# Patient Record
Sex: Male | Born: 1965 | Race: Black or African American | Hispanic: No
Health system: Western US, Academic
[De-identification: ages and names within clinical notes are randomized; demographics above are authoritative.]

## PROBLEM LIST (undated history)

## (undated) DIAGNOSIS — F1021 Alcohol dependence, in remission: Secondary | ICD-10-CM

## (undated) DIAGNOSIS — R21 Rash and other nonspecific skin eruption: Secondary | ICD-10-CM

## (undated) DIAGNOSIS — J45909 Unspecified asthma, uncomplicated: Secondary | ICD-10-CM

## (undated) DIAGNOSIS — S86819A Strain of other muscle(s) and tendon(s) at lower leg level, unspecified leg, initial encounter: Secondary | ICD-10-CM

## (undated) HISTORY — PX: KNEE SURGERY: SHX244

## (undated) HISTORY — PX: ACHILLES TENDON SURGERY: SHX542

---

## 2004-09-04 ENCOUNTER — Emergency Department (HOSPITAL_COMMUNITY): Admission: EM | Admit: 2004-09-04 | Discharge: 2004-09-04 | Payer: Self-pay | Admitting: Emergency Medicine

## 2004-10-24 ENCOUNTER — Encounter: Admission: RE | Admit: 2004-10-24 | Discharge: 2004-11-21 | Payer: Self-pay | Admitting: Orthopedic Surgery

## 2005-08-31 ENCOUNTER — Emergency Department (HOSPITAL_COMMUNITY): Admission: EM | Admit: 2005-08-31 | Discharge: 2005-09-01 | Payer: Self-pay | Admitting: Emergency Medicine

## 2005-10-09 ENCOUNTER — Ambulatory Visit: Payer: Self-pay | Admitting: Internal Medicine

## 2005-10-13 ENCOUNTER — Ambulatory Visit (HOSPITAL_COMMUNITY): Admission: RE | Admit: 2005-10-13 | Discharge: 2005-10-13 | Payer: Self-pay | Admitting: Internal Medicine

## 2005-10-20 ENCOUNTER — Ambulatory Visit: Payer: Self-pay | Admitting: Internal Medicine

## 2006-10-02 ENCOUNTER — Emergency Department (HOSPITAL_COMMUNITY): Admission: EM | Admit: 2006-10-02 | Discharge: 2006-10-02 | Payer: Self-pay | Admitting: Family Medicine

## 2006-10-05 ENCOUNTER — Emergency Department (HOSPITAL_COMMUNITY): Admission: EM | Admit: 2006-10-05 | Discharge: 2006-10-05 | Payer: Self-pay | Admitting: Emergency Medicine

## 2007-11-23 ENCOUNTER — Emergency Department (HOSPITAL_COMMUNITY): Admission: EM | Admit: 2007-11-23 | Discharge: 2007-11-23 | Payer: Self-pay | Admitting: Emergency Medicine

## 2009-04-20 ENCOUNTER — Emergency Department (HOSPITAL_COMMUNITY): Admission: EM | Admit: 2009-04-20 | Discharge: 2009-04-20 | Payer: Self-pay | Admitting: Emergency Medicine

## 2010-06-09 LAB — DIFFERENTIAL
Basophils Absolute: 0 10*3/uL (ref 0.0–0.1)
Basophils Relative: 1 % (ref 0–1)
Eosinophils Absolute: 0.1 10*3/uL (ref 0.0–0.7)
Monocytes Relative: 11 % (ref 3–12)
Neutrophils Relative %: 44 % (ref 43–77)

## 2010-06-09 LAB — COMPREHENSIVE METABOLIC PANEL
AST: 250 U/L — ABNORMAL HIGH (ref 0–37)
Albumin: 4.6 g/dL (ref 3.5–5.2)
BUN: 8 mg/dL (ref 6–23)
Creatinine, Ser: 0.86 mg/dL (ref 0.4–1.5)
Total Bilirubin: 0.9 mg/dL (ref 0.3–1.2)

## 2010-06-09 LAB — CBC
HCT: 50 % (ref 39.0–52.0)
Platelets: 196 10*3/uL (ref 150–400)
RBC: 5.22 MIL/uL (ref 4.22–5.81)
WBC: 4.6 10*3/uL (ref 4.0–10.5)

## 2011-01-07 LAB — URINE CULTURE: Colony Count: 6000

## 2011-01-07 LAB — POCT URINALYSIS DIP (DEVICE)
Glucose, UA: NEGATIVE
Hgb urine dipstick: NEGATIVE
Hgb urine dipstick: NEGATIVE
Ketones, ur: NEGATIVE
Operator id: 200941
Protein, ur: 100 — AB
Specific Gravity, Urine: 1.02
Urobilinogen, UA: 0.2
pH: 6

## 2011-01-07 LAB — HEPATIC FUNCTION PANEL
ALT: 27
AST: 34
Albumin: 4.1
Alkaline Phosphatase: 70
Bilirubin, Direct: 0.2
Indirect Bilirubin: 1.1 — ABNORMAL HIGH
Total Bilirubin: 1.3 — ABNORMAL HIGH
Total Protein: 8.1

## 2011-01-07 LAB — DIFFERENTIAL
Basophils Absolute: 0
Basophils Relative: 1
Eosinophils Absolute: 0.3
Eosinophils Relative: 5
Lymphocytes Relative: 30
Lymphs Abs: 2
Monocytes Absolute: 0.5
Monocytes Relative: 8
Neutro Abs: 3.7
Neutrophils Relative %: 57

## 2011-01-07 LAB — POCT I-STAT CREATININE: Creatinine, Ser: 1.3

## 2011-01-07 LAB — CBC
HCT: 46.7
Hemoglobin: 16
MCHC: 34.3
MCV: 89.8
Platelets: 274
RBC: 5.2
RDW: 14.9 — ABNORMAL HIGH
WBC: 6.5

## 2011-01-07 LAB — I-STAT 8, (EC8 V) (CONVERTED LAB)
Acid-Base Excess: 4 — ABNORMAL HIGH
Chloride: 102
HCT: 53 — ABNORMAL HIGH
Potassium: 3.8
Sodium: 138
TCO2: 31
pH, Ven: 7.432 — ABNORMAL HIGH

## 2011-01-07 LAB — PSA: PSA: 0.44

## 2011-01-07 LAB — POCT H PYLORI SCREEN: H. PYLORI SCREEN, POC: NEGATIVE

## 2011-03-20 ENCOUNTER — Emergency Department (HOSPITAL_COMMUNITY)
Admission: EM | Admit: 2011-03-20 | Discharge: 2011-03-21 | Discharge status: Against Medical Advice | Payer: Self-pay | Attending: *Deleted | Admitting: *Deleted

## 2011-03-20 ENCOUNTER — Encounter: Payer: Self-pay | Admitting: *Deleted

## 2011-03-20 DIAGNOSIS — F1010 Alcohol abuse, uncomplicated: Secondary | ICD-10-CM | POA: Insufficient documentation

## 2011-03-20 NOTE — ED Notes (Signed)
The pt was not driving he was on the street and gpd brought him here because he was ??? Intoxicated.  The pt has no complaints

## 2011-03-20 NOTE — ED Notes (Signed)
The pt was brought in by the gpd with a high blood alcohol

## 2011-03-22 ENCOUNTER — Encounter (HOSPITAL_COMMUNITY): Payer: Self-pay | Admitting: *Deleted

## 2011-03-22 ENCOUNTER — Emergency Department (HOSPITAL_COMMUNITY)
Admission: EM | Admit: 2011-03-22 | Discharge: 2011-03-24 | Disposition: A | Discharge status: Other Acute Inpatient Hospital | Payer: Self-pay | Attending: Emergency Medicine | Admitting: Emergency Medicine

## 2011-03-22 DIAGNOSIS — R45851 Suicidal ideations: Secondary | ICD-10-CM | POA: Insufficient documentation

## 2011-03-22 DIAGNOSIS — R51 Headache: Secondary | ICD-10-CM | POA: Insufficient documentation

## 2011-03-22 DIAGNOSIS — F1020 Alcohol dependence, uncomplicated: Secondary | ICD-10-CM | POA: Insufficient documentation

## 2011-03-22 LAB — COMPREHENSIVE METABOLIC PANEL
ALT: 32 U/L (ref 0–53)
AST: 44 U/L — ABNORMAL HIGH (ref 0–37)
Albumin: 4.2 g/dL (ref 3.5–5.2)
Alkaline Phosphatase: 75 U/L (ref 39–117)
BUN: 13 mg/dL (ref 6–23)
Calcium: 8.8 mg/dL (ref 8.4–10.5)
Glucose, Bld: 161 mg/dL — ABNORMAL HIGH (ref 70–99)
Sodium: 138 mEq/L (ref 135–145)
Total Bilirubin: 0.2 mg/dL — ABNORMAL LOW (ref 0.3–1.2)

## 2011-03-22 LAB — ACETAMINOPHEN LEVEL: Acetaminophen (Tylenol), Serum: <15 ug/mL (ref 10–30)

## 2011-03-22 LAB — RAPID URINE DRUG SCREEN, HOSP PERFORMED
Amphetamines: NOT DETECTED
Opiates: NOT DETECTED

## 2011-03-22 LAB — CBC
HCT: 46.6 % (ref 39.0–52.0)
Hemoglobin: 16.6 g/dL (ref 13.0–17.0)
MCHC: 35.6 g/dL (ref 30.0–36.0)
MCV: 88.1 fL (ref 78.0–100.0)
RDW: 13.4 % (ref 11.5–15.5)
WBC: 4.6 10*3/uL (ref 4.0–10.5)

## 2011-03-22 MED ORDER — SODIUM CHLORIDE 0.9 % IV BOLUS (SEPSIS)
1000.0000 mL | Freq: Once | INTRAVENOUS | Status: AC
  Administered 2011-03-22: 1000 mL via INTRAVENOUS

## 2011-03-22 MED ORDER — THIAMINE HCL 100 MG/ML IJ SOLN
Freq: Once | INTRAVENOUS | Status: DC
  Filled 2011-03-22: qty 1000

## 2011-03-22 MED ORDER — THIAMINE HCL 100 MG/ML IJ SOLN
Freq: Once | INTRAMUSCULAR | Status: AC
  Administered 2011-03-22: 22:00:00 via INTRAVENOUS
  Filled 2011-03-22: qty 1000

## 2011-03-22 NOTE — ED Notes (Signed)
Brought in by EMS due to intoxication, pt staes he has been drinking liquor, beer for the last few days straight. Pt is tearful and requesting detox.

## 2011-03-22 NOTE — ED Notes (Signed)
Bed:[facility]<BR> Expected date:03/22/11<BR> Expected time: 5:32 PM<BR> Means of arrival:Ambulance<BR> Comments:<BR> ems/fall

## 2011-03-22 NOTE — ED Notes (Signed)
Pt requested to be transferred from the [place] bed to a private room. Pt moved to room 23 with Charge Nurse approval.

## 2011-03-22 NOTE — ED Notes (Signed)
Pt reports wanting detox for ETOH.

## 2011-03-22 NOTE — ED Provider Notes (Signed)
History     CSN: [id number]  Arrival date & time 03/22/11  1637   First MD Initiated Contact with Patient 03/22/11 2000      No chief complaint on file.   (Consider location/radiation/quality/duration/timing/severity/associated sxs/prior treatment) HPI Comments: Patient is a chronic alcoholic, that several days ago.  He had a DUI and decided it was time to detox again.  His last detox.  Effort was 8 or 9 years ago in [place].  He states he goes to [facility], but not recently.  He does have a history of going into DTs when he comes off alcohol, but no seizure activity  The history is provided by the patient.    History reviewed. No pertinent past medical history.  History reviewed. No pertinent past surgical history.  History reviewed. No pertinent family history.  History  Substance Use Topics  . Smoking status: Never Smoker   . Smokeless tobacco: Not on file  . Alcohol Use: Yes      Review of Systems  Constitutional: Negative.   HENT: Negative for rhinorrhea and sinus pressure.   Respiratory: Negative for cough and shortness of breath.   Gastrointestinal: Negative for nausea, vomiting and abdominal pain.  Genitourinary: Negative for dysuria.  Musculoskeletal: Negative for back pain.  Neurological: Negative for dizziness.    Allergies  Review of patient's allergies indicates no known allergies.  Home Medications  No current outpatient prescriptions on file.  BP 129/84  Pulse 103  Temp(Src) 98.7 F (37.1 C) (Oral)  Resp 20  SpO2 91%  Physical Exam  Constitutional: He is oriented to person, place, and time. He appears well-developed and well-nourished.  HENT:  Head: Normocephalic and atraumatic.  Eyes: Right conjunctiva is injected. Left conjunctiva is injected.  Cardiovascular: Normal rate.   Pulmonary/Chest: Effort normal and breath sounds normal.  Abdominal: Soft.  Neurological: He is alert and oriented to person, place, and time.  Skin:  Skin is warm and dry.  Psychiatric: He has a normal mood and affect. He expresses no homicidal and no suicidal ideation.    ED Course  Procedures (including critical care time)  Labs Reviewed  COMPREHENSIVE METABOLIC PANEL - Abnormal; Notable for the following:    Potassium 3.4 (*)    Glucose, Bld 161 (*)    Total Protein 8.9 (*)    AST 44 (*)    Total Bilirubin 0.2 (*)    All other components within normal limits  ETHANOL - Abnormal; Notable for the following:    Alcohol, Ethyl (B) 434 (*)    All other components within normal limits  CBC  URINE RAPID DRUG SCREEN (HOSP PERFORMED)  ACETAMINOPHEN LEVEL   No results found.   No diagnosis found.  Patient has been assessed by activity.  He meets criteria for admission and will move to our psychiatric area with holding, orders.  We'll monitor for possible DTs and repeat alcohol level at 7:00 this morning  MDM  Patient's alcohol level is 434 will IV hydrate provide IV thiamine and folate monitor.  Alcohol levels reassess patient is levels decreased.  Monitor for DTs        Arman Filter, NP 03/22/11 2021  Arman Filter, NP 03/23/11 [phone]

## 2011-03-23 LAB — ETHANOL: Alcohol, Ethyl (B): 142 mg/dL — ABNORMAL HIGH (ref 0–11)

## 2011-03-23 MED ORDER — LORAZEPAM 1 MG PO TABS: 0.0000 mg | ORAL_TABLET | Freq: Two times a day (BID) | ORAL | Status: DC

## 2011-03-23 MED ORDER — ADULT MULTIVITAMIN W/MINERALS CH: 1.0000 | ORAL_TABLET | Freq: Every day | ORAL | Status: DC

## 2011-03-23 MED ORDER — NICOTINE 21 MG/24HR TD PT24
21.0000 mg | MEDICATED_PATCH | Freq: Every day | TRANSDERMAL | Status: DC
  Filled 2011-03-23: qty 1

## 2011-03-23 MED ORDER — FOLIC ACID 1 MG PO TABS
1.0000 mg | ORAL_TABLET | Freq: Every day | ORAL | Status: DC
  Administered 2011-03-23: 1 mg via ORAL
  Filled 2011-03-23: qty 1

## 2011-03-23 MED ORDER — VITAMIN B-1 100 MG PO TABS
100.0000 mg | ORAL_TABLET | Freq: Every day | ORAL | Status: DC
  Administered 2011-03-23 – 2011-03-24 (×2): 100 mg via ORAL
  Filled 2011-03-23 (×2): qty 1

## 2011-03-23 MED ORDER — LORAZEPAM 1 MG PO TABS
0.0000 mg | ORAL_TABLET | Freq: Four times a day (QID) | ORAL | Status: DC
  Administered 2011-03-23: 1 mg via ORAL
  Administered 2011-03-23: 2 mg via ORAL
  Administered 2011-03-24 (×2): 1 mg via ORAL
  Filled 2011-03-23 (×2): qty 1

## 2011-03-23 MED ORDER — THIAMINE HCL 100 MG/ML IJ SOLN: 100.0000 mg | Freq: Every day | INTRAMUSCULAR | Status: DC

## 2011-03-23 MED ORDER — ONDANSETRON HCL 4 MG PO TABS
4.0000 mg | ORAL_TABLET | Freq: Once | ORAL | Status: AC
  Administered 2011-03-23: 4 mg via ORAL

## 2011-03-23 MED ORDER — FOLIC ACID 1 MG PO TABS
1.0000 mg | ORAL_TABLET | Freq: Every day | ORAL | Status: DC
  Administered 2011-03-24: 1 mg via ORAL
  Filled 2011-03-23: qty 1

## 2011-03-23 MED ORDER — LORAZEPAM 2 MG/ML IJ SOLN: 1.0000 mg | Freq: Four times a day (QID) | INTRAMUSCULAR | Status: DC | PRN

## 2011-03-23 MED ORDER — VITAMIN B-1 100 MG PO TABS: 100.0000 mg | ORAL_TABLET | Freq: Every day | ORAL | Status: DC

## 2011-03-23 MED ORDER — IBUPROFEN 600 MG PO TABS: 600.0000 mg | ORAL_TABLET | Freq: Three times a day (TID) | ORAL | Status: DC | PRN

## 2011-03-23 MED ORDER — LORAZEPAM 1 MG PO TABS: 1.0000 mg | ORAL_TABLET | Freq: Four times a day (QID) | ORAL | Status: DC | PRN

## 2011-03-23 MED ORDER — ADULT MULTIVITAMIN W/MINERALS CH
1.0000 | ORAL_TABLET | Freq: Every day | ORAL | Status: DC
  Administered 2011-03-23 – 2011-03-24 (×2): 1 via ORAL
  Filled 2011-03-23 (×2): qty 1

## 2011-03-23 MED ORDER — ONDANSETRON HCL 4 MG PO TABS
4.0000 mg | ORAL_TABLET | Freq: Three times a day (TID) | ORAL | Status: DC | PRN
  Administered 2011-03-23: 4 mg via ORAL
  Filled 2011-03-23 (×2): qty 1

## 2011-03-23 MED ORDER — LORAZEPAM 1 MG PO TABS
1.0000 mg | ORAL_TABLET | Freq: Four times a day (QID) | ORAL | Status: DC | PRN
  Administered 2011-03-23: 1 mg via ORAL
  Filled 2011-03-23 (×2): qty 1
  Filled 2011-03-23: qty 2

## 2011-03-23 NOTE — ED Notes (Signed)
Information faxed to [facility] for review, waiting disposition.

## 2011-03-23 NOTE — ED Notes (Signed)
Up to the desk on the phone 

## 2011-03-23 NOTE — ED Notes (Signed)
Pt reports nausea has returned.  Dr linker updated-may repeat zofran now

## 2011-03-23 NOTE — ED Notes (Signed)
Pt changed into blue paper scrubs and security called to wand patient and his personal belongings.

## 2011-03-23 NOTE — ED Notes (Signed)
Lab in to draw

## 2011-03-23 NOTE — ED Notes (Signed)
Pt. Accepted in [facility] for treatment but bed will not be available until Monday 03/24/2011.

## 2011-03-23 NOTE — BH Assessment (Signed)
Assessment Note   Francisco Bates is an 45 y.o. male who is seeking help for detox and treatment for his alcohol abuse.  He states that he has been clean for close to a year and attends 12 step meetings on a regular basis.  He further shared that his recent relapse was due to reconnecting with a former friend from his home town that was in the area for [employer] and they start to drink together. Within that time frame he obtained a DWI and continue to drink but called 911 for help because he "could no longer keep drinking" like he was.  He was brought in by [facility] and based on their report he was walking down the street but the patient didn't remember it, he believes he experienced a blackout due to his drinking.  He has been in an inpatient treatment facility for 14 days and it was close to 5 years ago.  The facility was in [place] and he was unable to remember the name of the treatment facility.   He was coherent during the assessment and was also tearful about his relapse.  He keep making statements that he "knew better and I am to old to be acting like this."  He reports of having no medical conditions.  He reports of having no SI/HI or any visual or auditory hallucinations.   Axis I: Alcohol Dependence (303.90) Axis II: Diagnosis Deferred Axis III: Past Medical History: History reviewed. No pertinent past medical history.  History reviewed. No pertinent past surgical history. Axis IV: Relationship with is girlfriend is experience tension due to his alcohol use. Axis V: 30   Family History: History reviewed. No pertinent family history.  Social History:  reports that he has never smoked. He does not have any smokeless tobacco history on file. He reports that he drinks alcohol. His drug history not on file.  Additional Social History:  Alcohol / Drug Use Pain Medications: None reported Prescriptions: None reported Over the Counter: None reported History of alcohol / drug use?:  Yes Substance #1 Name of Substance 1: Alcohol  1 - Age of First Use: Unknown 1 - Amount (size/oz): He reports he is unsure of the amount he drinks. He drinks untill he get drunk. 1 - Frequency: He reports of having close to a year of being sober and this last past week he relapse and drunk for about three days straight. 1 - Duration: "For years on and off." 1 - Last Use / Amount: 03/22/2011 Allergies: No Known Allergies  Home Medications:  Medications Prior to Admission  Medication Dose Route Frequency Provider Last Rate Last Dose  . ibuprofen (ADVIL,MOTRIN) tablet 600 mg  600 mg Oral Q8H PRN Arman Filter, NP      . nicotine (NICODERM CQ - dosed in mg/24 hours) patch 21 mg  21 mg Transdermal Daily Arman Filter, NP      . ondansetron [facility]) tablet 4 mg  4 mg Oral Q8H PRN Arman Filter, NP      . sodium chloride 0.9 % 1,000 mL with thiamine 100 mg, folic acid 1 mg infusion   Intravenous Once [place], PA 150 mL/hr at 03/22/11 2144    . sodium chloride 0.9 % bolus 1,000 mL  1,000 mL Intravenous Once Arman Filter, NP   1,000 mL at 03/22/11 2143  . DISCONTD: sodium chloride 0.9 % 1,000 mL with thiamine 100 mg, folic acid 1 mg, multivitamins adult 10 mL infusion  Intravenous Once Arman Filter, NP       No current outpatient prescriptions on file as of 03/22/2011.    OB/GYN Status:  No LMP for male patient.  General Assessment Data Location of Assessment: [facility] ED ACT Assessment: Yes Living Arrangements: Spouse/significant other (Lives with girlfriend) Can pt return to current living arrangement?: Yes Admission Status: Voluntary Is patient capable of signing voluntary admission?: Yes Transfer from: Acute Hospital Referral Source: Self/Family/Friend ([employer], he reports of calling 911 for help & brought in by [facility])  Education Status Is patient currently in school?: No Highest grade of school patient has completed: High School  Risk to self Suicidal Ideation: No Suicidal Intent:  No Is patient at risk for suicide?: No Suicidal Plan?: No Access to Means: No What has been your use of drugs/alcohol within the last 12 months?: Alcohol, last drank 03/22/2011 Previous Attempts/Gestures: No How many times?: 0  Other Self Harm Risks: 0 Triggers for Past Attempts: None known Intentional Self Injurious Behavior: None Family Suicide History: No Recent stressful life event(s): Conflict (Comment);Other (Comment) (Old friend called to "hang out" & he started drinking again) Persecutory voices/beliefs?: No Depression: Yes Depression Symptoms: Tearfulness;Feeling worthless/self pity;Isolating;Guilt Substance abuse history and/or treatment for substance abuse?: Yes Suicide prevention information given to non-admitted patients: Not applicable  Risk to Others Homicidal Ideation: No Thoughts of Harm to Others: No Current Homicidal Intent: No Current Homicidal Plan: No Access to Homicidal Means: No Identified Victim: None reported History of harm to others?: No Assessment of Violence: None Noted Violent Behavior Description: None noted Does patient have access to weapons?: No Criminal Charges Pending?: Yes Describe Pending Criminal Charges: DWI Does patient have a court date: Yes  Psychosis Hallucinations: None noted Delusions: None noted  Mental Status Report Appear/Hygiene: Other (Comment) (Neat) Eye Contact: Good Motor Activity: Freedom of movement;Unsteady Speech: Logical/coherent Level of Consciousness: Alert;Crying Mood: Depressed;Despair;Guilty;Helpless;Sad;Worthless, low self-esteem Affect: Appropriate to circumstance;Depressed Anxiety Level: Minimal Thought Processes: Coherent;Relevant Judgement: Impaired Orientation: Person;Place;Time;Situation Obsessive Compulsive Thoughts/Behaviors: Minimal  Cognitive Functioning Concentration: Normal Memory: Recent Intact;Remote Intact IQ: Average Insight: Poor Impulse Control: Poor Appetite: Good Weight  Loss: 0  (No change reported) Weight Gain: 0  (No change reported) Sleep: No Change Total Hours of Sleep: 8  Vegetative Symptoms: None  Prior Inpatient Therapy Prior Inpatient Therapy: Yes Prior Therapy Dates: 2007 Prior Therapy Facilty/Provider(s): Name is unknown but was in [place] Reason for Treatment: Alcohol Abuse  Prior Outpatient Therapy Prior Outpatient Therapy: No          Abuse/Neglect Assessment (Assessment to be complete while patient is alone) Physical Abuse: Denies Verbal Abuse: Denies Sexual Abuse: Denies Exploitation of patient/patient's resources: Denies Self-Neglect: Denies Values / Beliefs Cultural Requests During Hospitalization: None Spiritual Requests During Hospitalization: None Consults Spiritual Care Consult Needed: No Social Work Consult Needed: No      Additional Information 1:1 In Past 12 Months?: No CIRT Risk: No Elopement Risk: No Does patient have medical clearance?: No     Disposition:  Disposition Disposition of Patient: Inpatient treatment program;Outpatient treatment;Referred to [facility]) Type of inpatient treatment program: Adult Type of outpatient treatment: Adult Patient referred to: Other (Comment) ([facility] and [facility])  On Site Evaluation by:   Reviewed with Physician:     Morley Kos MS, LCAS, LPCA, NCC 03/23/2011 2:33 AM

## 2011-03-23 NOTE — ED Notes (Signed)
CSW contacted [facility] to follow up on acceptance. [facility] staff informed Clinical research associate pt does not meet detox criteria and for ACT to pursue substance abuse treatment referral on Monday December 31. CSW will notify pt's RN and MD.

## 2011-03-23 NOTE — ED Provider Notes (Signed)
Patient seen and evaluated in the psych ED this morning. He has been accepted at [facility] for alcohol detox and there are still attempts being made for her behavioral health visit her in if possible today. He complains of a mild headache. He has no other complaints and is resting comfortably at this time.  Ethelda Chick, MD 03/23/11 0800

## 2011-03-23 NOTE — ED Notes (Signed)
Nausea/vomiting improved, sipping ginger ale, skin dry, ativan given

## 2011-03-23 NOTE — ED Notes (Signed)
Pt has one personal belongings bag at the Nurses' Station

## 2011-03-23 NOTE — ED Notes (Signed)
Gave report to [place], RN in [employer], escorted pt to room 35 in Psych Ed and locked up one personal belongings bag in the activity room in Psych ED.

## 2011-03-23 NOTE — ED Notes (Signed)
Waiting on infusion to complete before transferring patient to the Psych ED

## 2011-03-24 ENCOUNTER — Inpatient Hospital Stay (HOSPITAL_COMMUNITY)
Admission: AD | Admit: 2011-03-24 | Discharge: 2011-03-28 | DRG: 897 | Disposition: A | Discharge status: Home / Self Care | Payer: Self-pay | Source: Ambulatory Visit | Attending: Psychiatry | Admitting: Psychiatry

## 2011-03-24 ENCOUNTER — Encounter (HOSPITAL_COMMUNITY): Payer: Self-pay | Admitting: *Deleted

## 2011-03-24 DIAGNOSIS — F411 Generalized anxiety disorder: Secondary | ICD-10-CM

## 2011-03-24 DIAGNOSIS — Z6379 Other stressful life events affecting family and household: Secondary | ICD-10-CM

## 2011-03-24 DIAGNOSIS — F1090 Alcohol use, unspecified, uncomplicated: Secondary | ICD-10-CM | POA: Diagnosis present

## 2011-03-24 DIAGNOSIS — F1010 Alcohol abuse, uncomplicated: Principal | ICD-10-CM

## 2011-03-24 DIAGNOSIS — F3289 Other specified depressive episodes: Secondary | ICD-10-CM

## 2011-03-24 DIAGNOSIS — F1020 Alcohol dependence, uncomplicated: Secondary | ICD-10-CM | POA: Diagnosis present

## 2011-03-24 DIAGNOSIS — F329 Major depressive disorder, single episode, unspecified: Secondary | ICD-10-CM

## 2011-03-24 DIAGNOSIS — Z79899 Other long term (current) drug therapy: Secondary | ICD-10-CM

## 2011-03-24 DIAGNOSIS — I10 Essential (primary) hypertension: Secondary | ICD-10-CM

## 2011-03-24 LAB — ETHANOL: Alcohol, Ethyl (B): <11 mg/dL (ref 0–11)

## 2011-03-24 MED ORDER — LOPERAMIDE HCL 2 MG PO CAPS: 2.0000 mg | ORAL_CAPSULE | ORAL | Status: AC | PRN

## 2011-03-24 MED ORDER — MAGNESIUM HYDROXIDE 400 MG/5ML PO SUSP: 30.0000 mL | Freq: Every day | ORAL | Status: DC | PRN

## 2011-03-24 MED ORDER — CLONIDINE HCL 0.1 MG PO TABS
0.1000 mg | ORAL_TABLET | Freq: Three times a day (TID) | ORAL | Status: DC
  Administered 2011-03-24 (×2): 0.1 mg via ORAL
  Filled 2011-03-24 (×2): qty 1

## 2011-03-24 MED ORDER — CHLORDIAZEPOXIDE HCL 25 MG PO CAPS
25.0000 mg | ORAL_CAPSULE | Freq: Every day | ORAL | Status: AC
  Administered 2011-03-28: 25 mg via ORAL
  Filled 2011-03-24: qty 1

## 2011-03-24 MED ORDER — VITAMIN B-1 100 MG PO TABS
100.0000 mg | ORAL_TABLET | Freq: Every day | ORAL | Status: DC
  Administered 2011-03-25 – 2011-03-28 (×4): 100 mg via ORAL
  Filled 2011-03-24 (×7): qty 1

## 2011-03-24 MED ORDER — HYDROXYZINE HCL 25 MG PO TABS
25.0000 mg | ORAL_TABLET | Freq: Four times a day (QID) | ORAL | Status: AC | PRN
  Administered 2011-03-25 – 2011-03-26 (×3): 25 mg via ORAL
  Filled 2011-03-24: qty 1

## 2011-03-24 MED ORDER — ALUM & MAG HYDROXIDE-SIMETH 200-200-20 MG/5ML PO SUSP: 30.0000 mL | ORAL | Status: DC | PRN

## 2011-03-24 MED ORDER — ACETAMINOPHEN 325 MG PO TABS: 650.0000 mg | ORAL_TABLET | Freq: Four times a day (QID) | ORAL | Status: DC | PRN

## 2011-03-24 MED ORDER — CHLORDIAZEPOXIDE HCL 25 MG PO CAPS
25.0000 mg | ORAL_CAPSULE | Freq: Three times a day (TID) | ORAL | Status: AC
  Administered 2011-03-26 (×3): 25 mg via ORAL
  Filled 2011-03-24 (×3): qty 1

## 2011-03-24 MED ORDER — ADULT MULTIVITAMIN W/MINERALS CH
1.0000 | ORAL_TABLET | Freq: Every day | ORAL | Status: DC
  Administered 2011-03-24 – 2011-03-25 (×2): via ORAL
  Administered 2011-03-26 – 2011-03-28 (×3): 1 via ORAL
  Filled 2011-03-24 (×6): qty 1

## 2011-03-24 MED ORDER — CLONIDINE HCL 0.1 MG PO TABS
0.1000 mg | ORAL_TABLET | Freq: Three times a day (TID) | ORAL | Status: DC
  Administered 2011-03-25 – 2011-03-28 (×12): 0.1 mg via ORAL
  Filled 2011-03-24 (×18): qty 1

## 2011-03-24 MED ORDER — CHLORDIAZEPOXIDE HCL 25 MG PO CAPS
25.0000 mg | ORAL_CAPSULE | ORAL | Status: AC
  Administered 2011-03-27 (×2): 25 mg via ORAL
  Filled 2011-03-24 (×2): qty 1

## 2011-03-24 MED ORDER — CHLORDIAZEPOXIDE HCL 25 MG PO CAPS: 25.0000 mg | ORAL_CAPSULE | Freq: Four times a day (QID) | ORAL | Status: AC | PRN

## 2011-03-24 MED ORDER — ONDANSETRON 4 MG PO TBDP: 4.0000 mg | ORAL_TABLET | Freq: Four times a day (QID) | ORAL | Status: AC | PRN

## 2011-03-24 MED ORDER — CHLORDIAZEPOXIDE HCL 25 MG PO CAPS
25.0000 mg | ORAL_CAPSULE | Freq: Four times a day (QID) | ORAL | Status: AC
  Administered 2011-03-24 – 2011-03-25 (×4): 25 mg via ORAL
  Filled 2011-03-24 (×3): qty 1

## 2011-03-24 MED ORDER — FOLIC ACID 1 MG PO TABS
1.0000 mg | ORAL_TABLET | Freq: Every day | ORAL | Status: DC
  Administered 2011-03-24 – 2011-03-28 (×5): 1 mg via ORAL
  Filled 2011-03-24 (×8): qty 1

## 2011-03-24 MED ORDER — INFLUENZA VIRUS VACC SPLIT PF IM SUSP
0.5000 mL | INTRAMUSCULAR | Status: AC
  Administered 2011-03-25: 0.5 mL via INTRAMUSCULAR

## 2011-03-24 NOTE — BH Assessment (Signed)
Per shift report pt does not meet detox criteria. ACT to refer patient into a residential setting for treatment.  Writer called to inquire about bed availability for the residential program. Spoke to [place] and she verified beds. Writer initiated the process by faxing patients information to [facility]. Writer then completed a phone referral with Shayla. Once the referral process was completed the psyhiatrist (Dr. Rogers Blocker) interceded stating, "patient has HBP and needs detox". Dr. Rogers Blocker explained that patient needs to be detoxed.   Writer reversed the entire process that was just completed to get patient into the residential program. Renato Battles back to cancel the referral.   Patient's disposition is pending a detox program, per Dr. Rogers Blocker.

## 2011-03-24 NOTE — Progress Notes (Signed)
In dayroom, watching TV on approach. Appears flat and depressed. Calm and cooperative with assessment. A/Ox4. No acute distress noted. States he is adjusting well to milieu. When asked how he was feeling states," I am anxious and angry with myself for doing this.". Support and encouragement provided. Discussed events leading up to admission. Remorseful for hanging out with someone he knew was a bad influence. Expresses desire to remain sober. CIWA is a 1 r/t mild anxiety. Otherwise has no questions or concerns. Denies SI/HI/AVH and contracts for safety. Denies pain or discomfort. POC and medications for the shift reviewed and understanding verbalized. Safety has been maintained with Q16minute observation. Will cont current POC.

## 2011-03-24 NOTE — ED Provider Notes (Addendum)
He was seen at 09:24  hours. He denies any needs at this time. He is alert and oriented x3. He is calm. He continues to feel he needs inpatient treatment. Flint Melter, MD 03/24/11 1610  Flint Melter, MD 03/24/11 [phone]

## 2011-03-24 NOTE — BH Assessment (Signed)
Patient assessed and awaiting a evaluation by the psychiatrist Dr. Rogers Blocker to determine disposition.

## 2011-03-24 NOTE — ED Provider Notes (Signed)
History/physical exam/procedure(s) were performed by non-physician practitioner and as supervising physician I was immediately available for consultation/collaboration. I have reviewed all notes and am in agreement with care and plan.   Mahika Vanvoorhis S Spencer Peterkin, MD 03/24/11 1546 

## 2011-03-24 NOTE — Progress Notes (Signed)
Patient ID: Francisco Bates, male   DOB: 02/14/1966, 45 y.o.   MRN: [id number] Pt admitted voluntarily for ETOH detox, pt reports being clean for about a year and had a recent relapse with a friend. Pt denies any significant medical history, states that he is on no home medications and does report feeling anxious on admission. Pt denies any suicidality, homicidality or auditory hallucinations. Pt currently lives with fiancee and plans to discharge back there, pt denies any pain on admission and denies the use of any other substances

## 2011-03-24 NOTE — BH Assessment (Signed)
Patient accepted to [facility] by Dr. Eulogio Ditch. Patient assigned to room 503-2. Writer informed the EDP (Dr. Effie Shy) of patients disposition. Writer informed patients nurse and support paperwork was completed. Pt will be transferred to [facility] via security.

## 2011-03-24 NOTE — Consult Note (Signed)
Patient Identification:  [name] Date of Evaluation:  03/24/2011   History of Present Illness:  Patient is seen and assessment reviewed. Francisco Bates is an 45 y.o. male who is seeking help for detox and treatment for his alcohol abuse. He states that he has been clean for close to a year and attends 12 step meetings on a regular basis. He further shared that his recent relapse was due to reconnecting with a former friend from his home town that was in the area for Francisco Bates and they start to drink together. Within that time frame he obtained a DWI and continue to drink but called 911 for help because he "could no longer keep drinking" like he was. He was brought in by [facility] and based on their report he was walking down the street but the patient didn't remember it, he believes he experienced a blackout due to his drinking.  Patient is calm cooperative during the interview not hallucinating or delusional not suicidal or homicidal is motivated for detox.   Past Medical History:    History reviewed. No pertinent past medical history.    History reviewed. No pertinent past surgical history.  Filed Vitals:   03/24/11 1003  BP: 152/104  Pulse: 89  Temp: 98.5 F (36.9 C)  Resp: 18    Lab Results:   BMET    Component Value Date/Time   NA 138 03/22/2011 1645   K 3.4* 03/22/2011 1645   CL 101 03/22/2011 1645   CO2 21 03/22/2011 1645   GLUCOSE 161* 03/22/2011 1645   BUN 13 03/22/2011 1645   CREATININE 0.93 03/22/2011 1645   CALCIUM 8.8 03/22/2011 1645   GFRNONAA >90 03/22/2011 1645   GFRAA >90 03/22/2011 1645    Allergies: No Known Allergies  Current Medications:  Prior to Admission medications   Not on File    Social History:    reports that he has never smoked. He does not have any smokeless tobacco history on file. He reports that he drinks alcohol. His drug history not on file.   Family History:    History reviewed. No pertinent family  history.   DIAGNOSIS:   AXIS I  chronic alcohol dependence   AXIS II  Deffered  AXIS III See medical notes.  AXIS IV  relationship issues with girlfriend   AXIS V 45     Recommendations:  Patient will be continued on Ativan detox protocol and will be admitted for further detox and stabilization.    Eulogio Ditch, MD

## 2011-03-25 DIAGNOSIS — F1090 Alcohol use, unspecified, uncomplicated: Secondary | ICD-10-CM | POA: Diagnosis present

## 2011-03-25 DIAGNOSIS — F1020 Alcohol dependence, uncomplicated: Secondary | ICD-10-CM | POA: Diagnosis present

## 2011-03-25 DIAGNOSIS — F1010 Alcohol abuse, uncomplicated: Principal | ICD-10-CM

## 2011-03-25 HISTORY — DX: Alcohol use, unspecified, uncomplicated: F10.90

## 2011-03-25 NOTE — Progress Notes (Signed)
Pt was moved from the 500 hall to the 300 hall d/t his ETOH use and anticipation of withdrawal symptoms.  Pt c/o anxiety and mild tremors, but no other symptoms at this time.  Pt had been given Vistaril at 1854.  Pt is at the nurse's station requesting info of the group he attended this morning as he felt it was very beneficial and wanted to share with his girlfriend who is visiting at this time.  Pt is pleasant/cooperative.  He denies SI/HI.  He voices no other complaints at this time.  Safety maintained with q15 minute checks.

## 2011-03-25 NOTE — H&P (Signed)
Psychiatric Admission Assessment Adult  Patient Identification:  Francisco Bates Date of Evaluation:  03/25/2011 46 yo DAAM who is engaged History of Present Illness:: Short relapse on alcohol but did get a DWI.Got up with an old friend who was in town for the holidays. Originally came to ED on Thursday but left AMA and then returned. Reports going to AA meetings but now realizes that he doesn't actually work the program-doesn't share and has never gotten a sponsor. ETOH was 434.Denies DT's withdrawal seizures or blackouts in past 5-6  years.    Past Psychiatric History: one prior detox a few years ago in [place] -had DWI then too.   Substance Abuse History: Alcohol says he just relapsed on Thursday -had been sober 5-6 years.  Social History:    reports that he has never smoked. He does not have any smokeless tobacco history on file. He reports that he drinks alcohol. His drug history not on file. HS class of '87 Divorced once with a 70 yo daughter. 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 Lives with fiancee.  Family Psych History:Uncles both sides mother and maternal aunts all have alcohol issues.   Past Medical History:    No past medical history on file.    No past surgical history on file.  Allergies: No Known Allergies  Current Medications:  Prior to Admission medications   Not on File    Mental Status Examination/Evaluation: Objective:  Appearance: Well Groomed  Psychomotor Activity:  Normal  Eye Contact::  Good  Speech:  Clear and Coherent  Volume:  Normal  Mood:  Back to normal  Affect:  Full Range  Thought Process:  Clear rational goal oriented   Orientation:  Full  Thought Content:  No AVH psychosis   Suicidal Thoughts:  No  Homicidal Thoughts:  No  Judgement:  Intact  Insight:  Good    DIAGNOSIS:    AXIS I Alcohol Abuse  AXIS II Deferred  AXIS III See medical history.  AXIS IV poor choice   AXIS V 41-50  serious symptoms     Treatment Plan Summary: Admit for medically supervised alcohol detox. Reports short relapse 2 days but did get DWI. Use Librium as indicated.  Agree with H&P from ED.  Mickie Deery Chontel Warning PA-C

## 2011-03-25 NOTE — Progress Notes (Signed)
Suicide Risk Assessment  Admission Assessment     Demographic factors:  Assessment Details Time of Assessment: Admission Information Obtained From: Patient Current Mental Status:    Loss Factors:  Loss Factors: Legal issues Historical Factors:    Risk Reduction Factors:  Risk Reduction Factors: Sense of responsibility to family;Living with another person, especially a relative;Positive social support;Positive therapeutic relationship;Positive coping skills or problem solving skills  CLINICAL FACTORS:   Severe Anxiety and/or Agitation Depression:   Comorbid alcohol abuse/dependence Hopelessness Alcohol/Substance Abuse/Dependencies Previous Psychiatric Diagnoses and Treatments  COGNITIVE FEATURES THAT CONTRIBUTE TO RISK:  No Cognitive risk factors noted.   SUICIDE RISK:   Minimal: No identifiable suicidal ideation.  Patients presenting with no risk factors but with morbid ruminations; may be classified as minimal risk based on the severity of the depressive symptoms  Reason for admission: Desires detox from brief relapse with alcohol  Mental Status Examination and Plan: Patient denies suicidal or homicidal ideation, hallucinations, illusions, or delusions. Patient engages with good eye contact, is able to focus adequately in a one to one setting, and has clear goal directed thoughts. Patient speaks with a natural conversational volume, rate, and tone. Anxiety was reported at 3 on a scale of 1 the least and 10 the most. Depression was reported at 1 on the same scale. Patient is oriented times 4, recent and remote memory intact. Judgement: impaired by his addiction Insight: impaired by his addiction. Plan:  We will admit the patient for crisis stabilization and treatment. I talked to pt about starting Librium protocol for detox. I explained the risks and benefits of medication in detail.  We will continue on q. 15 checks the unit protocol. At this time there is no clinical  indication for one-to-one observation as patient contract for safety and presents little risk to harm themself and others.  We will increase collateral information. I encourage patient to participate in group milieu therapy. Please see history and physical note for more detailed information ELOS: 3 to 5 days.   Francisco Bates 03/25/2011, 11:34 AM

## 2011-03-25 NOTE — Progress Notes (Signed)
Out of room and interacting with peers today.  Attending groups.  Affect brighter.  Denies suicidal ideation.  Tolerating medications well.  No overt withdrawal symptoms noted.  Appetite good.  Pleasant and cooperative with staff.

## 2011-03-25 NOTE — Progress Notes (Addendum)
[facility] Group Notes: (Counselor/Nursing/MHT/Case Management/Adjunct)   Type of Therapy:  Group Therapy  Participation Level:  Active  Participation Quality:  Attentive, Appropriate, Sharing  Affect:  Blunted  Cognitive:  Appropriate  Insight: good  Engagement in Group: Good    Engagement in Therapy:  Good  Modes of Intervention:  Support and Exploration  Summary of Progress/Problems:  Benjimen processed changes that he is willing to make after discharge and identified          That he needs to be more engaged in AA by finding a sponsor and actually working the steps.  He shared that last time he attended the meetings but that was all, and although he thought he got what he needed at the time, he now realizes that having a connection with others in the group will be very helpful on his recovery journey. He explored ways to set this change into action and ways to keep himself motivated. Connar identified that maintenance is the stage he has trouble with, and that he doesn't have any friends outside of drinking buddies, so when they call him up his desire to be accepted kicks in and he goes to hang out with them, even though he knows their values differ.                Billie Lade 03/25/2011  11:45 AM

## 2011-03-25 NOTE — Discharge Planning (Signed)
Met with patient briefly in day room.  He states there are no pressing case management needs today and will wait until tomorrow to talk with his regular Case Manager.   Ambrose Mantle, LCSW 03/25/2011, 3:07 PM

## 2011-03-26 DIAGNOSIS — F1020 Alcohol dependence, uncomplicated: Secondary | ICD-10-CM

## 2011-03-26 NOTE — Discharge Planning (Signed)
Francisco Bates attended Am group, participation.  Was sober for 2 years until recent relapse on alcohol, resulting in DUI last week. Last detox was 2 years ago at [facility].  States he was going to AA several times a week during sobriety, but was not really active, and did not seek out a sponsor. Knows now that he needs to fully engage in process.  Has a landscaping business.  Taking classes this semester.  Plans to return home at d/c.  Follow up [facility] if on meds.

## 2011-03-26 NOTE — Progress Notes (Signed)
[facility] Group Notes:  (Counselor/Nursing/MHT/Case Management/Adjunct)  03/26/2011 6:01 PM   Type of Therapy:  Processing Group at 11:00 am  Participation Level:  Active   Participation Quality:  Appropriate  Affect:  appropriate  Cognitive:  Appropriate   Insight:  Good   Engagement in Group:  good   Engagement in Therapy:   good   Modes of Intervention:   exploration socialization and education   Summary of Progress/Problems: Francisco Bates was attentive and sharing during discussion about patterns of communication we learned in early childhood from our family. He shared extreme differences about what really happened in the home and what was portrayed outside the home. Patient was open about the fact that he does not share his own thinking with those closest to him he needs a safe place, like a sponsor, someone he can share that his thoughts with.  [facility] Group Notes:  (Counselor/Nursing/MHT/Case Management/Adjunct)  03/26/2011 6:01 PM   Type of Therapy:  Counseling Group at 1:15 pm  Participation Level:  Active   Participation Quality:   appropriate   Affect:   appropriate  Cognitive:   appropriate   Insight:   good   Engagement in Group:  Good   Engagement in Therapy:   good   Modes of Intervention:   exploration and socialization and support   Summary of Progress/Problems:  Francisco Bates shared how his drinking picked up in one day after 2-1/2 years of abstinence; at the hospital his blood alcohol was over 3 he received a DUI and he lost touch with reality. Patient sees the need to increase his involvement in the 12 step recovery group such as AA. Patient was open to the disease concept of addiction although he is feeling morally low.  [name], LCSWA 03/26/2011 6:01 PM

## 2011-03-26 NOTE — Progress Notes (Signed)
Pt attends groups and interacts well with peers and staff. Pt was offered support and encouragement. Pt denies SI/HI. Pt is receptive to treatment and safety is maintained on unit.

## 2011-03-27 NOTE — Progress Notes (Signed)
Pt attends groups and interacts well with peers and staff. Pt was offered support and encouragement. Pt denies SI/HI. Pt may be D/C tomorrow. Pt is concerned about leaving because ride will not be able to get him until after 6pm. Pt was reassured that if he was able to leave it would be ok if he left after 6pm. Pt is receptive to treatment and safety is maintained on unit.

## 2011-03-27 NOTE — Treatment Plan (Signed)
Interdisciplinary Treatment Plan Update (Adult)  Date: 03/27/2011  Time Reviewed: 10:32 AM   Progress in Treatment: Attending groups: Yes Participating in groups: Yes Taking medication as prescribed: Yes Tolerating medication: Yes   Family/Significant othe contact made: None identifed   Patient understands diagnosis:  Yes  As evidenced by asking for help with alcoholism Discussing patient identified problems/goals with staff:  Yes See below Medical problems stabilized or resolved:  Yes Denies suicidal/homicidal ideation: Yes Issues/concerns per patient self-inventory:  None noted Other:  New problem(s) identified: N/A  Reason for Continuation of Hospitalization: Medication stabilization Withdrawal symptoms  Interventions implemented related to continuation of hospitalization: Librium detox protocol  Group participation  Additional comments:  Estimated length of stay: d/c tomorrow  Discharge Plan: Return home  Get fully involved in AA, including getting a sponsor  Follow up mental health New goal(s): N/A  Review of initial/current patient goals per problem list:   1.  Goal(s):Safely detox from alcohol  Met:  No  Target date:1/4  As evidenced [id number] in CIWA score from current to 0  2.  Goal (s):Set up with mental health services in [place]  Met:  No  Target date:1/4  As evidenced [id number] at [facility]   3.  Goal(s):Set up with health services in [facility] for HTN  Met:  No  Target date:1/4  As evidenced by:PA to set up screening appointment at [facility]  4.  Goal(s):  Met:  No  Target date:  As evidenced by:  Attendees: Patient:  Francisco Bates 03/27/2011 10:32 AM  Family:     Physician:  Lupe Carney 03/27/2011 10:32 AM   Nursing:  Omelia Blackwater  03/27/2011 10:32 AM   Case Manager:  Richelle Ito, LCSW 03/27/2011 10:32 AM   Counselor:  [name], LCSWA 03/27/2011 10:32 AM   Other: Shelda Jakes  03/27/2011 10:32 AM  Other:     Other:      Other:      Scribe for Treatment Team:   [name], 03/27/2011 10:32 AM

## 2011-03-27 NOTE — Progress Notes (Signed)
Pt in the dayroom watching TV.  Attended and participated in evening group.  Interacting well with his peers.  Denies any withdrawal symptoms at this time.  Voices no complaints/concerns.  Safety maintained with q15 minute checks.

## 2011-03-27 NOTE — Progress Notes (Signed)
Suicide Risk Assessment  Discharge Assessment     Demographic factors:  See chart.  Current Mental Status: Patient seen and evaluated. Chart reviewed. Patient stated that his mood was "good". His affect was mood congruent and euthymic. He denied any current thoughts of self injurious behavior, suicidal ideation or homicidal ideation. He denied any significant depressive signs or symptoms at this time. There were no auditory or visual hallucinations, paranoia, delusional thought processes, or mania noted.  Thought process was linear and goal directed.  No psychomotor agitation or retardation was noted. His speech was normal rate, tone and volume. Eye contact was good. Judgment and insight are fair.  Patient has been up and engaged on the unit.  No safety concerns reported from team.  Taper completed Friday.  Loss Factors:  Legal issues  Historical Factors:  Patient Active Problem List  Diagnoses  . Alcohol dependence     Risk Reduction Factors:  Risk Reduction Factors: Sense of responsibility to family;Living with another person, especially a relative;Positive social support;Positive therapeutic relationship;Positive coping skills or problem solving skills  CLINICAL FACTORS (noted upon admission): Severe Anxiety and/or Agitation Depression:   Comorbid alcohol abuse/dependence Hopelessness Alcohol/Substance Abuse/Dependencies Previous Psychiatric Diagnoses and Treatments  COGNITIVE FEATURES THAT CONTRIBUTE TO RISK: none.  SUICIDE RISK: Pt viewed as a chronic moderate risk of harm to self in light of her past hx and risk factors.  No acute safety concerns since on the unit.  Pt contracting for safety and looking for discharge on 03/28/11.  Filed Vitals:   03/27/11 1500  BP: 127/78  Pulse: 73  Temp: 97.1 F (36.2 C)  Resp: 18   Plan: Pt seen and evaluated.  Chart reviewed.  Pt stable for and requesting discharge. Pt contracting for safety and does not currently meet [place] involuntary  commitment criteria for continued hospitalization.  Mental health treatment, medication management and continued sobriety will mitigate against the increased risk of harm to self and/or others.  Discussed the importance of recovery further with pt, as well as, tools to move forward in a healthy & safe manner.  Pt agreeable with the plan.  Discussed with the team.  Please see orders, follow up plans per team and full discharge summary to be completed by physician extender.  Discharge likely in am on 03/27/10.  Lupe Carney 03/27/2011, 8:59 PM

## 2011-03-27 NOTE — Progress Notes (Signed)
Recreation Therapy Group Note  Date: 03/27/2011         Time: 1415      Group Topic/Focus: The focus of this group is on enhancing the patient's understanding of leisure, barriers to leisure, and the importance of engaging in positive leisure activities upon discharge for improved total health.  Participation Level: Appropriate  Participation Quality: Attentive and Sharing  Affect: Appropriate  Cognitive: Oriented   Additional Comments: Patient spoke about the factors that led to his relapse, was supportive of peers and was able to identify positive leisure interests.  Verlisa Vara 03/27/2011 3:51 PM

## 2011-03-27 NOTE — Progress Notes (Signed)
[facility] Group Notes:  (Counselor/Nursing/MHT/Case Management/Adjunct)  03/27/2011 3:59 PM   Type of Therapy:  Processing Group at 11:00 am  Participation Level:   Active  Participation Quality:  Appropriate  Affect:  Appropriate  Cognitive:  Appropriate  Insight:  Good  Engagement in Group:  Good  Engagement in Therapy:  Good  Modes of Intervention:  Exploration and activity  Summary of Progress/Problems:  Francisco Bates choose to contribute to discussion on balance in life and getting out needs meet. He shared that peace of mind and the people we choose to spend our time with have a great affect on our outcomes. Francisco Bates also shared "I like rainy days; they give me a sense of peacefulness."  [facility] Group Notes:  (Counselor/Nursing/MHT/Case Management/Adjunct)  03/27/2011 3:59 PM   Type of Therapy:  Counseling Group at 1:15 pm  Participation Level:  Active  Participation Quality:  Appropriate  Affect:  Appropriate  Cognitive:  Appropriate  Insight:  Good   Engagement in Group:  Good   Engagement in Therapy:  Good  Modes of Intervention:  Exploration, clarification and activity  Summary of Progress/Problems:  Francisco Bates choose two photographs during activity. One depicted a broken down bike and he shared "some people probably think this is when things are going bad but to me it represents the possibility of really going somewhere. Sure, the wheels are off and parts are missing, yet it is just a bike, those things can be reinstalled, repaired, replaced and then I'd have ability to go anywhere I desire to go."  The other photo depicted a person at the edge of a tunnel to which he shared " I have to make a concious decision to go indies the tunnel, into the darkness."  [name], LCSWA 03/27/2011 3:59 PM

## 2011-03-27 NOTE — Progress Notes (Signed)
Patient ID: Francisco Bates, male   DOB: May 20, 1965, 46 y.o.   MRN: [id number]  S/O:  Pt seen and evaluated.  Chart reviewed.  Pt said he felt "fine". Was sober for 2.5 yrs and then relapsed on alcohol.  VSS.  On clonidine for HTN.  Librium taper in progress.  No difficulties reported.  Denied any current thoughts of self injurious behavior, suicidal ideation or homicidal ideation. He denied any significant depressive signs or symptoms at this time. There were no auditory or visual hallucinations, paranoia, delusional thought processes, or mania noted.  Thought process was linear and goal directed.  No psychomotor agitation or retardation was noted. His speech was normal rate, tone and volume. Eye contact was good. Judgment and insight are fair.  Patient has been up and engaged on the unit.  No safety concerns reported from team.  Meds:   . chlordiazePOXIDE  25 mg Oral BH-qamhs   Followed by  . chlordiazePOXIDE  25 mg Oral Daily  . cloNIDine  0.1 mg Oral TID  . folic acid  1 mg Oral Daily  . mulitivitamin with minerals  1 tablet Oral Daily  . thiamine  100 mg Oral Daily   A/P: Pt stable. Cont. current meds.  Discharge likely 03/28/11.  Pt agreeable with plan.  No clinical indication for any med changes at this time.  See orders.

## 2011-03-28 NOTE — Progress Notes (Signed)
Pt D/C home. Pt follow-up visits were discussed and pt verbalized understanding. Pt denies SI/HI. Pt belongings were returned.

## 2011-03-28 NOTE — Progress Notes (Signed)
[facility] Group Notes:  (Counselor/Nursing/MHT/Case Management/Adjunct)  03/28/2011 5:12 PM   Type of Therapy:  Processing Group at 11:00 am  Participation Level:   active  Participation Quality:  Appropriate  Affect:  Appropriate  Cognitive:  Appropriate  Insight:  Good  Engagement in Group:  Good  Engagement in Therapy:  Good  Modes of Intervention:  Exploration clarification and support  Summary of Progress/Problems: Sly shared appropriate fear related to relapse, an additional fears related to obtaining a new sponsor. Apparently patient's previous sponsor broke his confidence by telling others personal information. Group members encouraged patient to remember that when choosing and a sponsor and put that on the table is something he needs in order to have an honest relationship. Patient was also able to provide support and encouragement to another patient his job is at risk because of his relapse.  [facility] Group Notes:  (Counselor/Nursing/MHT/Case Management/Adjunct)  03/28/2011 5:12 PM   Type of Therapy:  Counseling Group at 1:15 pm  Participation Level:  Active  Participation Quality:  Appropriate  Affect:  Appropriate, somewhat tearful  Cognitive:  Appropriate  Insight:  Good  Engagement in Group:  Good  Engagement in Therapy:  Good  Modes of Intervention:  Exploration clarification and support  Summary of Progress/Problems:  Continued group discussion on subject of relapse in which patient shared importance of whom we spend our time with. Pt  shared his goals of completing a education and being mentor for young children "if I could change one live feel like my life would be worth while" patient was somewhat tearful sharing about great influence don't have in his life. Patient encouraged to rate depression and taking note. And patient shared that that person is deceased he was also open to suggestion to writing to another teacher, any teacher and providing encouragement to  them.   [name], LCSWA 03/28/2011 5:12 PM

## 2011-03-28 NOTE — Discharge Summary (Signed)
Discharge Note  Patient:  Francisco Bates is an 46 y.o., male DOB:  1966/01/28  Date of Admission:  03/24/2011  Date of Discharge:  03/28/2011  Level of Care:  OP  Discharge destination:  HOME  Is patient on multiple antipsychotic therapies at discharge:  NO  Patient phone:  [phone] (home) Patient address:   [address] [place] [address]  The patient received suicide prevention pamphlet:  YES Belongings returned:  Judene Companion, Najat Olazabal 03/28/2011,6:28 PM

## 2011-03-28 NOTE — Progress Notes (Signed)
Francisco Bates [facility] Case Management Discharge Plan:  Will you be returning to the same living situation after discharge: Yes,  home Would you like a referral for services when you are discharged:Yes,  [facility] Do you have access to transportation at discharge:Yes,  vehicle Do you have the ability to pay for your medications:Yes,  none prescribed  Interagency Information:     Patient to Follow up at:  Follow-up Information    Follow up with [facility] on 04/04/2011. (1:30 for paperwork 2:00 with Francisco Bates)    Contact information:   [address] Francisco Bates [address]  [phone]        Follow up with [facility]. (Call for appointment if interested in pursuing)    Contact information:   [address]  [place] [address] [phone]         Patient denies SI/HI:   Yes,  yes    Safety Planning and Suicide Prevention discussed:  Yes,  yes  Barrier to discharge identified:No.  Summary and Recommendations:   Francisco Bates 03/28/2011, 11:26 AM

## 2011-03-28 NOTE — Progress Notes (Signed)
Pt is asleep at this time. Pt appears to be in no signs of distress at this time. Respirations are even and unlabored.Pt safety remains with q15min checks.  

## 2011-03-31 NOTE — Progress Notes (Signed)
Patient Discharge Instructions:  Admission Note Faxed,  03/31/2011 Discharge Note Faxed,   03/31/2011 After Visit Summary Faxed,  03/31/2011 Faxed to the Next Level Care provider:  03/31/2011 Facesheet faxed 03/31/2011   Faxed to [facility] @ [phone]  Heloise Purpura, Eduard Clos, 03/31/2011, 3:21 PM

## 2011-04-12 NOTE — Discharge Summary (Signed)
Francisco Bates September 25, 1965 45 y.o.  [id number]     Date of Admission:  03/25/2011 Date of Discharge:  04/12/2011 Diagnosis: AXIS I  Alcohol abuse/dependence  AXIS II  deferred  AXIS III Past Medical History  Diagnosis Date  .       AXIS IV  difficulty with primary support group  AXIS V 61-70 mild symptoms    Francisco Bates:NFAOZHY is a chronic alcoholic, that several days ago. He had a DUI and decided it was time to detox again. His last detox. Effort was 8 or 9 years ago in [place]. He states he goes to [facility], but not recently. He does have a history of going into DTs when he comes off alcohol, but no seizure activity  Hospital Course: The duration of Francisco Bates"s stay at [facility] was un remarkable.      The patient was seen and evaluated by the Treatment team consisting of Psychiatrist, PAC, RN, Case Manager, and Therapist for evaluation and treatment plan with goal of stabilization upon discharge. The patient's physical and mental health problems were identified and treated appropriately.      Multiple modalities of treatment were used including medication, individual and group therapies, unit programming, AA/NA, improved nutrition, physical activity, and family sessions as needed.     The symptoms of alcohol/substance abuse withdrawal were monitored daily by serial clinical withdrawal scores. Improvement was demonstrated by declining CIWA/COWS numbers, improving vital signs, increased cognition, and improvement in mood, sleep, appetite as well as a reduction in psychosocial symptoms.       The patient was evaluated and found to be stable enough for discharge and was released to home per the initial plan of treatment.  MSE: For mental status exam please see SRA completed by MD on day of discharge. Labs: Level of Care:  Long-term IP psych.  Meds on Discharge:There are no discharge medications for this patient.  Is patient on multiple antipsychotic therapies at discharge:  No   Has Patient had  three or more failed trials of antipsychotic monotherapy by history:  No  Follow-up recommendations:  Keep follow up appointment at [facility]. Discharge destination:  Home Comments: Please keep all follow up appointments as scheduled. Francisco Bates. Francisco Bates PAC for DR.Alyson Kuroski-MazzieMD 04/12/2011

## 2011-05-27 ENCOUNTER — Encounter (HOSPITAL_COMMUNITY): Payer: Self-pay | Admitting: *Deleted

## 2011-05-27 ENCOUNTER — Encounter (HOSPITAL_COMMUNITY): Payer: Self-pay | Admitting: Emergency Medicine

## 2011-05-27 ENCOUNTER — Emergency Department (INDEPENDENT_AMBULATORY_CARE_PROVIDER_SITE_OTHER)
Admission: EM | Admit: 2011-05-27 | Discharge: 2011-05-27 | Disposition: A | Discharge status: Nursing Home (Includes ICF) | Payer: Self-pay | Source: Home / Self Care | Attending: Emergency Medicine | Admitting: Emergency Medicine

## 2011-05-27 ENCOUNTER — Emergency Department (HOSPITAL_COMMUNITY)
Admission: EM | Admit: 2011-05-27 | Discharge: 2011-05-27 | Disposition: A | Discharge status: Home / Self Care | Payer: Self-pay | Attending: Emergency Medicine | Admitting: Emergency Medicine

## 2011-05-27 DIAGNOSIS — R42 Dizziness and giddiness: Secondary | ICD-10-CM | POA: Insufficient documentation

## 2011-05-27 DIAGNOSIS — H5461 Unqualified visual loss, right eye, normal vision left eye: Secondary | ICD-10-CM

## 2011-05-27 DIAGNOSIS — H5460 Unqualified visual loss, one eye, unspecified: Secondary | ICD-10-CM

## 2011-05-27 DIAGNOSIS — H538 Other visual disturbances: Secondary | ICD-10-CM | POA: Insufficient documentation

## 2011-05-27 MED ORDER — TETRACAINE HCL 0.5 % OP SOLN
1.0000 [drp] | Freq: Once | OPHTHALMIC | Status: AC
  Administered 2011-05-27: 1 [drp] via OPHTHALMIC
  Filled 2011-05-27: qty 2

## 2011-05-27 NOTE — ED Notes (Signed)
Pt to tx rm. MD at bedside. Pt reports intermittent cloudy vision x one week that worsens and betters at times. Reports sudden onset and lt eye is normal.

## 2011-05-27 NOTE — ED Provider Notes (Signed)
History     CSN: [id number]  Arrival date & time 05/27/11  1528   First MD Initiated Contact with Patient 05/27/11 1704      Chief Complaint  Patient presents with  . Blurred Vision    (Consider location/radiation/quality/duration/timing/severity/associated sxs/prior treatment) HPI Comments: Patient with acute onset of painless loss of vision in his right eye starting a week ago. States he turned off a light and when he turned it back on, he felt a "cloud" in his right eye- describes his vision as "hazy".  This haziness waxes and wanes, but has gotten significantly worse today. Does not describe halos around lights. No nausea, vomiting, fevers, photophobia, headaches, ear pain, periorbital swelling. No facial weakness, dysarthria, discoordination, focal neurological deficits. Patient has never had anything like this happen before. No trauma to the eye.   ROS as noted in HPI. All other ROS negative.   Patient is a 46 y.o. male presenting with eye problem. The history is provided by the patient. No language interpreter was used.  Eye Problem  This is a new problem. The current episode started more than 2 days ago. The problem occurs daily. The problem has been gradually worsening. There is pain in the right eye. There was no injury mechanism. The patient is experiencing no pain. There is no history of trauma to the eye. There is no known exposure to pink eye. He does not wear contacts. Pertinent negatives include no numbness, no blurred vision, no discharge, no double vision, no foreign body sensation, no photophobia, no eye redness, no nausea, no vomiting, no tingling, no weakness and no itching. He has tried nothing for the symptoms. The treatment provided no relief.    Past Medical History  Diagnosis Date  . Alcohol abuse     Past Surgical History  Procedure Date  . Achilles tendon surgery     Family History  Problem Relation Age of Onset  . Hypertension Other     History    Substance Use Topics  . Smoking status: Never Smoker   . Smokeless tobacco: Not on file  . Alcohol Use: No     quit in 02/2011      Review of Systems  Eyes: Negative for blurred vision, double vision, photophobia, discharge and redness.  Gastrointestinal: Negative for nausea and vomiting.  Skin: Negative for itching.  Neurological: Negative for tingling, weakness and numbness.    Allergies  Review of patient's allergies indicates no known allergies.  Home Medications  No current outpatient prescriptions on file.  BP 155/88  Pulse 78  Temp(Src) 98.6 F (37 C) (Oral)  Resp 16  SpO2 100%  Physical Exam  Nursing note and vitals reviewed. Constitutional: He is oriented to person, place, and time. He appears well-developed and well-nourished.  HENT:  Head: Normocephalic and atraumatic.  Nose: Nose normal.  Mouth/Throat: Oropharynx is clear and moist.  Eyes: Conjunctivae and EOM are normal. Pupils are equal, round, and reactive to light. Right eye exhibits no discharge.  Fundoscopic exam:      The right eye shows no hemorrhage and no papilledema.       The left eye shows no hemorrhage and no papilledema.       Visual acuity right eye 20/200, left eye 2020. Visual fields intact to confrontation. Patient states he does not perceive ophthalmoscope light shined directly into his right eye.  Neck: Normal range of motion. Neck supple.  Cardiovascular: Normal rate.   Pulmonary/Chest: Effort normal. No respiratory distress.  Abdominal: He exhibits no distension.  Musculoskeletal: Normal range of motion.  Neurological: He is alert and oriented to person, place, and time. He has normal strength. No cranial nerve deficit or sensory deficit. He displays a negative Romberg sign. Coordination and gait normal.  Skin: Skin is warm and dry.  Psychiatric: He has a normal mood and affect. His behavior is normal.    ED Course  Procedures (including critical care time)  Labs Reviewed - No  data to display No results found.   1. Visual loss, right eye      MDM  Previous charts, labs reviewed. Patient admitted in December 2012 for suicidal ideation, alcoholism/EtOH detox. UDS negative. Was normotensive at the time- Blood pressure on discharge was 132/ 84.   Pupils equally round and reactive, no photophobia. Cornea clear, EOMs normal. No periorbital swelling. All visual fields intact. Funduscopic normal. Patient does not perceive light shined directly into his right eye. Concern for central retinal arterial occlusion, central retinal venous occlusion, or stroke. Patient's blood pressure elevated here, and has been normotensive based on previous records. Patient otherwise neurologically intact here. Transferring to ED.  Luiz Blare, MD 05/27/11 [phone]

## 2011-05-27 NOTE — ED Notes (Signed)
Pt c/o right eye feeling cloudy x's 1 week.  Not getting any better

## 2011-05-27 NOTE — Discharge Instructions (Signed)
Follow up with ophthalmology (eye doctor) as noted above even if well.  Return immediately for significant pain or other concerns.    Blurred Vision You have been seen today complaining of blurred vision. This means you have a loss of ability to see small details.  CAUSES  Blurred vision can be a symptom of underlying eye problems, such as:  Aging of the eye (presbyopia).   Glaucoma.   Cataracts.   Eye infection.   Eye-related migraine.   Diabetes mellitus.   Fatigue.   Migraine headaches.   High blood pressure.   Breakdown of the back of the eye (macular degeneration).   Problems caused by some medications.  The most common cause of blurred vision is the need for eyeglasses or a new prescription. Today in the emergency department, no cause for your blurred vision can be found. SYMPTOMS  Blurred vision is the loss of visual sharpness and detail (acuity). DIAGNOSIS  Should blurred vision continue, you should see your caregiver. If your caregiver is your primary care physician, he or she may choose to refer you to another specialist.  TREATMENT  Do not ignore your blurred vision. Make sure to have it checked out to see if further treatment or referral is necessary. SEEK MEDICAL CARE IF:  You are unable to get into a specialist so we can help you with a referral. SEEK IMMEDIATE MEDICAL CARE IF: You have severe eye pain, severe headache, or sudden loss of vision. MAKE SURE YOU:   Understand these instructions.   Will watch your condition.   Will get help right away if you are not doing well or get worse.  Document Released: 03/13/2003 Document Revised: 02/27/2011 Document Reviewed: 10/13/2007 [facility] Patient Information 2012 [place], [place].

## 2011-05-27 NOTE — ED Provider Notes (Signed)
History     CSN: [id number]  Arrival date & time 05/27/11  4098   First MD Initiated Contact with Patient 05/27/11 2108      Chief Complaint  Patient presents with  . Eye Problem    (Consider location/radiation/quality/duration/timing/severity/associated sxs/prior treatment) HPI History provided by the patient.  46 year old male presenting with complaint of vision loss.  Patient states the symptoms began about one week ago. He reportedly turned on the light and acutely noted right-sided "hazy" vision, as if a screen or fog was in front him.  No associated pain at rest or with eye movement. Patient states he has had occasional improvement of this vision loss, however only lasting for hours. Patient also endorses rare redness and watery discharge which both have since resolved. Patient reports symptoms as moderate to severe.  No associated headache, nausea, vomiting, weakness, numbness, difficulty swallowing, or asymmetric face. Patient has had occasional difficulty ambulating, described as "feeling as though my equilibrium is off;" this has also been intermittent and brief.  No history of trauma. Patient was seen in urgent care prior to ED arrival with a Right sided vision of 20/200.  No h/o similar Sx's in the past.  Pt denies known h/o HTN, HLD, or DM.   Past Medical History  Diagnosis Date  . Alcohol abuse     Past Surgical History  Procedure Date  . Achilles tendon surgery     Family History  Problem Relation Age of Onset  . Hypertension Other     History  Substance Use Topics  . Smoking status: Never Smoker   . Smokeless tobacco: Not on file  . Alcohol Use: No     quit in 02/2011      Review of Systems  Constitutional: Negative for fever and chills.  HENT: Negative for congestion, sore throat and rhinorrhea.   Eyes: Positive for discharge (watery, gone), redness (gone) and visual disturbance. Negative for photophobia, pain and itching.  Respiratory: Negative for  cough and shortness of breath.   Cardiovascular: Negative for chest pain and palpitations.  Gastrointestinal: Negative for nausea, vomiting, abdominal pain and diarrhea.  Genitourinary: Negative for dysuria and hematuria.  Musculoskeletal: Positive for gait problem (rare with associated disequilibrium). Negative for back pain.  Skin: Negative for rash and wound.  Neurological: Negative for syncope, speech difficulty, weakness, numbness and headaches.  Psychiatric/Behavioral: Negative for confusion and agitation.  All other systems reviewed and are negative.    Allergies  Review of patient's allergies indicates no known allergies.  Home Medications  No current outpatient prescriptions on file.  BP 152/98  Pulse 65  Temp(Src) 98.6 F (37 C) (Oral)  Resp 16  SpO2 99%  Physical Exam  Nursing note and vitals reviewed. Constitutional: He is oriented to person, place, and time. He appears well-developed and well-nourished. No distress.  HENT:  Head: Normocephalic and atraumatic.  Right Ear: External ear normal.  Left Ear: External ear normal.  Nose: Nose normal.  Mouth/Throat: Oropharynx is clear and moist.  Eyes: Conjunctivae, EOM and lids are normal. Pupils are equal, round, and reactive to light. Right eye exhibits no discharge. Left eye exhibits no discharge.       VA:  Rt 20/100, Lt 20/20  IOP:  Rt 15, Lt 12  Grossly NL fundo exam bilaterally.  Neck: Normal range of motion. Neck supple.  Cardiovascular: Normal rate, regular rhythm and intact distal pulses.   No murmur heard. Pulmonary/Chest: Effort normal and breath sounds normal. No respiratory distress.  Abdominal: Soft. Bowel sounds are normal. He exhibits no distension. There is no tenderness.  Musculoskeletal: Normal range of motion. He exhibits no edema.  Neurological: He is alert and oriented to person, place, and time.  Skin: Skin is dry. No rash noted. He is not diaphoretic.  Psychiatric: He has a normal mood  and affect. Judgment normal.    ED Course  Procedures (including critical care time)  Labs Reviewed - No data to display No results found.   1. Blurry vision       MDM  46 year old male presenting with painless right sided vision loss with greater sensation of disequilibrium without headache or other neuro symptoms.  No h/o contact use.  Exam as above, NL IOP, no afferent pupillary defect, no pain with EOM movement, grossly unremarkable fundo exam, but Rt VA of 20/100.  C/f vessel occlusion.  D/w ophtho (Dr. Gwen Pounds) who suspects HTN retinopathy or branch v. occlusion.  No rec for imaging or labs at this time; pt to f/u with him in 2 days for complete exam and further mgmt.  Strict return precautions reviewed, esp for vision change on the Lt.  Pt reports understanding.       Particia Lather, MD 05/27/11 [phone]

## 2011-05-27 NOTE — ED Notes (Signed)
Intermittent blurred vision the past week with some dizziness.   Denies fever, N or V.  He is getting over a cold

## 2011-05-28 LAB — GLUCOSE, CAPILLARY: Glucose-Capillary: 78 mg/dL (ref 70–99)

## 2011-05-28 NOTE — ED Provider Notes (Signed)
I saw and evaluated the patient, reviewed the resident's note and I agree with the findings and plan.  His eye appears normal.  He has no diplopia.  His blood sugar is normal in ER.  He'll followup with the ophthalmologist in 2 days.  His case was discussed with the ophthalmologist over the phone by the resident   Lyanne Co, MD 05/28/11 0005

## 2011-05-30 ENCOUNTER — Encounter (HOSPITAL_COMMUNITY): Payer: Self-pay | Admitting: Emergency Medicine

## 2011-05-30 ENCOUNTER — Emergency Department (HOSPITAL_COMMUNITY)
Admission: EM | Admit: 2011-05-30 | Discharge: 2011-05-30 | Disposition: A | Discharge status: Home / Self Care | Payer: Self-pay | Attending: Emergency Medicine | Admitting: Emergency Medicine

## 2011-05-30 ENCOUNTER — Emergency Department (HOSPITAL_COMMUNITY): Payer: Self-pay

## 2011-05-30 DIAGNOSIS — H547 Unspecified visual loss: Secondary | ICD-10-CM | POA: Insufficient documentation

## 2011-05-30 DIAGNOSIS — H5710 Ocular pain, unspecified eye: Secondary | ICD-10-CM | POA: Insufficient documentation

## 2011-05-30 LAB — DIFFERENTIAL
Eosinophils Relative: 3 % (ref 0–5)
Lymphocytes Relative: 32 % (ref 12–46)
Monocytes Absolute: 0.7 10*3/uL (ref 0.1–1.0)
Monocytes Relative: 15 % — ABNORMAL HIGH (ref 3–12)
Neutro Abs: 2.3 10*3/uL (ref 1.7–7.7)

## 2011-05-30 LAB — CBC
HCT: 43.4 % (ref 39.0–52.0)
Hemoglobin: 15.5 g/dL (ref 13.0–17.0)
MCV: 88.8 fL (ref 78.0–100.0)
WBC: 4.6 10*3/uL (ref 4.0–10.5)

## 2011-05-30 LAB — POCT I-STAT, CHEM 8
BUN: 20 mg/dL (ref 6–23)
Calcium, Ion: 1.22 mmol/L (ref 1.12–1.32)
Chloride: 103 mEq/L (ref 96–112)
Creatinine, Ser: 0.9 mg/dL (ref 0.50–1.35)
Glucose, Bld: 94 mg/dL (ref 70–99)
Potassium: 4.3 mEq/L (ref 3.5–5.1)

## 2011-05-30 LAB — RPR: RPR Ser Ql: NONREACTIVE

## 2011-05-30 MED ORDER — IOHEXOL 300 MG/ML  SOLN
65.0000 mL | Freq: Once | INTRAMUSCULAR | Status: AC | PRN
  Administered 2011-05-30: 65 mL via INTRAVENOUS

## 2011-05-30 NOTE — ED Provider Notes (Addendum)
History     CSN: [id number]  Arrival date & time 05/30/11  1115   First MD Initiated Contact with Patient 05/30/11 1201      Chief Complaint  Patient presents with  . Blurred Vision    (Consider location/radiation/quality/duration/timing/severity/associated sxs/prior treatment) HPI Comments: Patient saw the ophthalmologist today based on his decreased vision. Ophthalmologist send him here for further workup as he was concerned that patient may have a worm in his right eye consistent with trichinosis versus harrada's.  Patient denies any pain in the eye and just decreased and blurry vision.  Patient is a 45 y.o. male presenting with eye problem. The history is provided by the patient.  Eye Problem  This is a new problem. Episode onset: 10 days ago. The problem occurs constantly. The problem has not changed since onset.There is pain in the right eye. There was no injury mechanism. The pain is at a severity of 0/10. The patient is experiencing no pain. There is no history of trauma to the eye. There is no known exposure to pink eye. He does not wear contacts. Associated symptoms include blurred vision and decreased vision. Pertinent negatives include no double vision, no eye redness, no nausea, no vomiting and no weakness. He has tried nothing for the symptoms. The treatment provided no relief.    Past Medical History  Diagnosis Date  . Alcohol abuse     Past Surgical History  Procedure Date  . Achilles tendon surgery     Family History  Problem Relation Age of Onset  . Hypertension Other     History  Substance Use Topics  . Smoking status: Never Smoker   . Smokeless tobacco: Not on file  . Alcohol Use: No     quit in 02/2011      Review of Systems  Eyes: Positive for blurred vision. Negative for double vision and redness.  Gastrointestinal: Negative for nausea and vomiting.  Neurological: Negative for weakness.  All other systems reviewed and are  negative.    Allergies  Review of patient's allergies indicates no known allergies.  Home Medications  No current outpatient prescriptions on file.  BP 134/90  Pulse 77  Temp(Src) 98.3 F (36.8 C) (Oral)  Resp 18  SpO2 98%  Physical Exam  Constitutional: He is oriented to person, place, and time. He appears well-developed and well-nourished. No distress.  HENT:  Head: Normocephalic and atraumatic.  Eyes: EOM are normal. Pupils are equal, round, and reactive to light. Right eye exhibits no chemosis, no discharge and no exudate. Right conjunctiva is not injected.       Decreased vision in the right eye  Neurological: He is alert and oriented to person, place, and time.  Skin: Skin is dry. No rash noted. No erythema.    ED Course  Procedures (including critical care time)  Labs Reviewed  DIFFERENTIAL - Abnormal; Notable for the following:    Monocytes Relative 15 (*)    All other components within normal limits  CBC  SEDIMENTATION RATE  POCT I-STAT, CHEM 8  TOXOPLASMA GONDII ANTIBODY, IGM  TOXOPLASMA GONDII ANTIBODY, IGG  FLUORESCENT TREPONEMAL AB(FTA)-IGG-BLD  RPR   Ct Head W Wo Contrast  05/30/2011  *RADIOLOGY REPORT*  Clinical Data: Trichinosis. Blurred vision.  Abnormal ophthalmologic exam.  CT HEAD WITHOUT AND WITH CONTRAST  Technique:  Contiguous axial images were obtained from the base of the skull through the vertex without and with intravenous contrast.  Contrast: 65mL OMNIPAQUE IOHEXOL 300 MG/ML IJ SOLN  Comparison: None.  Findings: There is a focal 6 mm calcification within the lateral periorbital soft tissues on the right.  No focal orbital lesion is evident.  No focal calcifications are cortical infarcts are present.  The postcontrast images demonstrates no areas of ring enhancement.  Mild mucosal thickening is present in the right sphenoid sinus. There is some fluid in the right maxillary sinus.  A left ethmoid air cells opacified.  The frontal sinuses are clear.   The left maxillary sinus and bilateral mastoid air cells are clear.  IMPRESSION:  1.  No focal parenchymal lesion to suggest a truck masses in the brain. 2.  No pathologic enhancement. 3.  Minimal sinus disease.  Original Report Authenticated By: Jamesetta Orleans. MATTERN, M.D.     1. Decreased vision       MDM   Patient sent in by Dr. Luciana Axe with ophthalmology due to possible worm in his right eye. Patient has had decreased vision over the last 10 days and when evaluated by he may have a worm consistent with a tape worm or trichinosis since he recently had it improperly prepared pork. Patient does not have her regular doctor and the ophthalmologist was having a difficult time getting patient ongoing followup. He was sent here for syphilis titers, toxoplasmosis titers, CT with and without contrast of the brain to rule out Sr. cervical cyst. Will also speak with infectious disease once the results return so that patient can have close followup. Patient will see ophthalmology again on Monday.  2:47 PM CT negative for acute pathology. CBC without eosinophilia. Spoke with infectious disease and they will follow up with the patient on Tuesday. They had lower concern for parasitic infection given normal eosinophils and negative brain scan. These results were discussed with the patient and he was discharged home.         Gwyneth Sprout, MD 05/30/11 1447  Gwyneth Sprout, MD 06/29/11 [phone]

## 2011-05-30 NOTE — ED Notes (Signed)
Pt sent here by eye doctor for eval; pt with worm in eye and was seen here 3/5; pt has paperwork from optho

## 2011-05-30 NOTE — Discharge Instructions (Signed)
Visual Disturbances   You have had a disturbance in your vision. This may be caused by various conditions, such as:   Migraines. Migraine headaches are often preceded by a disturbance in vision. Blind spots or light flashes are followed by a headache. This type of visual disturbance is temporary. It does not damage the eye.   Glaucoma. This is caused by increased pressure in the eye. Symptoms include haziness, blurred vision, or seeing rainbow colored circles when looking at bright lights. Partial or complete visual loss can occur. You may or may not experience eye pain. Visual loss may be gradual or sudden and is irreversible. Glaucoma is the leading cause of blindness.   Retina problems. Vision will be reduced if the retina becomes detached or if there is a circulation problem as with diabetes, high blood pressure, or a mini-stroke. Symptoms include seeing "floaters," flashes of light, or shadows, as if a curtain has fallen over your eye.   Optic nerve problems. The main nerve in your eye can be damaged by redness, soreness, and swelling (inflammation), poor circulation, drugs, and toxins.  It is very important to have a complete exam done by a specialist to determine the exact cause of your eye problem. The specialist may recommend medicines or surgery, depending on the cause of the problem. This can help prevent further loss of vision or reduce the risk of having a stroke. Contact the caregiver to whom you have been referred and arrange for follow-up care right away.   SEEK IMMEDIATE MEDICAL CARE IF:   Your vision gets worse.   You develop severe headaches.   You have any weakness or numbness in the face, arms, or legs.   You have any trouble speaking or walking.  Document Released: 04/17/2004 Document Revised: 02/27/2011 Document Reviewed: 08/08/2009   [employer] Patient Information 2012 [employer].

## 2012-11-06 ENCOUNTER — Emergency Department (HOSPITAL_COMMUNITY): Payer: Self-pay

## 2012-11-06 ENCOUNTER — Emergency Department (HOSPITAL_COMMUNITY)
Admission: EM | Admit: 2012-11-06 | Discharge: 2012-11-06 | Disposition: A | Discharge status: Home / Self Care | Payer: Self-pay | Attending: Emergency Medicine | Admitting: Emergency Medicine

## 2012-11-06 ENCOUNTER — Encounter (HOSPITAL_COMMUNITY): Payer: Self-pay | Admitting: Nurse Practitioner

## 2012-11-06 DIAGNOSIS — Z9889 Other specified postprocedural states: Secondary | ICD-10-CM | POA: Insufficient documentation

## 2012-11-06 DIAGNOSIS — Y92838 Other recreation area as the place of occurrence of the external cause: Secondary | ICD-10-CM | POA: Insufficient documentation

## 2012-11-06 DIAGNOSIS — W1809XA Striking against other object with subsequent fall, initial encounter: Secondary | ICD-10-CM | POA: Insufficient documentation

## 2012-11-06 DIAGNOSIS — S838X9A Sprain of other specified parts of unspecified knee, initial encounter: Secondary | ICD-10-CM | POA: Insufficient documentation

## 2012-11-06 DIAGNOSIS — S86811A Strain of other muscle(s) and tendon(s) at lower leg level, right leg, initial encounter: Secondary | ICD-10-CM

## 2012-11-06 DIAGNOSIS — Y9367 Activity, basketball: Secondary | ICD-10-CM | POA: Insufficient documentation

## 2012-11-06 DIAGNOSIS — Y9239 Other specified sports and athletic area as the place of occurrence of the external cause: Secondary | ICD-10-CM | POA: Insufficient documentation

## 2012-11-06 MED ORDER — HYDROCODONE-ACETAMINOPHEN 5-325 MG PO TABS: 1.0000 | ORAL_TABLET | Freq: Four times a day (QID) | ORAL | Status: DC | PRN

## 2012-11-06 MED ORDER — HYDROMORPHONE HCL PF 1 MG/ML IJ SOLN
1.0000 mg | Freq: Once | INTRAMUSCULAR | Status: AC
  Administered 2012-11-06: 1 mg via INTRAVENOUS
  Filled 2012-11-06: qty 1

## 2012-11-06 MED ORDER — ONDANSETRON HCL 4 MG/2ML IJ SOLN
4.0000 mg | Freq: Once | INTRAMUSCULAR | Status: AC
  Administered 2012-11-06: 4 mg via INTRAVENOUS
  Filled 2012-11-06: qty 2

## 2012-11-06 NOTE — ED Notes (Signed)
Per ortho, a pillow should not be placed under knee in order to keep it straight. Ortho will put ice back on knee after immobilizer placed.

## 2012-11-06 NOTE — ED Notes (Signed)
Bed: WA07 Expected date: 11/06/12 Expected time: 5:47 PM Means of arrival: Ambulance Comments: Knee Injury

## 2012-11-06 NOTE — ED Notes (Addendum)
Per EMS:  Pt at basketball court surrounded by kids; pt stepped on a little kid and felt knee "buckled and popped."  Pt has previous right knee surgery to remove all cartilage in 1995.  EMS placed ice on knee and administered Fentanyl.

## 2012-11-06 NOTE — ED Provider Notes (Signed)
CSN: [id number]     Arrival date & time 11/06/12  1800 History     First MD Initiated Contact with Patient 11/06/12 1812     Chief Complaint  Patient presents with  . Knee Pain    right   (Consider location/radiation/quality/duration/timing/severity/associated sxs/prior Treatment) Patient is a 47 y.o. male presenting with knee pain. The history is provided by the patient.  Knee Pain Associated symptoms: no back pain, no fever and no neck pain   pt states was playing basketball today, came down awkwardly around kids/others playing, right knee buckled, pain in right knee. Pain constant. Dull. Moderate/severe. Non radiating. No ankle or hip pain. hasnt tried to walk since fall. States hurts to move knee. States prior arthroscopy to same knee.  No leg numbness/weakness. Skin intact. Denies other pain or injury. No head injury. No neck or back pain.      Past Medical History  Diagnosis Date  . Alcohol abuse    Past Surgical History  Procedure Laterality Date  . Achilles tendon surgery     Family History  Problem Relation Age of Onset  . Hypertension Other    History  Substance Use Topics  . Smoking status: Never Smoker   . Smokeless tobacco: Not on file  . Alcohol Use: No     Comment: quit in 02/2011    Review of Systems  Constitutional: Negative for fever.  HENT: Negative for neck pain.   Cardiovascular: Negative for leg swelling.  Musculoskeletal: Negative for back pain.  Skin: Negative for wound.  Neurological: Negative for weakness, numbness and headaches.    Allergies  Review of patient's allergies indicates no known allergies.  Home Medications  No current outpatient prescriptions on file. BP 192/136  Pulse 105  Temp(Src) 98.9 F (37.2 C)  SpO2 95% Physical Exam  Nursing note and vitals reviewed. Constitutional: He is oriented to person, place, and time. He appears well-developed and well-nourished. No distress.  HENT:  Head: Atraumatic.  Eyes:  Conjunctivae are normal.  Neck: Neck supple. No tracheal deviation present.  Cardiovascular: Normal rate.   Pulmonary/Chest: Effort normal. No accessory muscle usage. No respiratory distress.  Abdominal: He exhibits no distension. There is no tenderness.  Musculoskeletal:  Pt keeps right knee partially flexed. Resists movement/rom, or touching knee - pt limiting initial knee exam.  Suspect effusion.  ?defect at/near patella tendon/prox tib. Skin intact.  Dp/pt 2+.  Good rom at hip and ankle without pain. No other focal bony tenderness.   Neurological: He is alert and oriented to person, place, and time.  Motor/sens fxn intact RLE. Able to dorsiflex and plantar flex at ankle w normal strength. Pt refuses to move/flex/extend right knee.     Skin: Skin is warm and dry.  Psychiatric: He has a normal mood and affect.    ED Course   Procedures (including critical care time)  Dg Knee Complete 4 Views Right  11/06/2012   *RADIOLOGY REPORT*  Clinical Data: Jumping and landing injury  RIGHT KNEE - COMPLETE 4+ VIEW  Comparison: None.  Findings: Four views of the right knee submitted.  There is diffuse mild narrowing of the joint space.  There is superior displacement of the patella.  This is highly suspicious for patellar tendon rupture.  Clinical correlation is necessary.  There is significant infrapatellar and anterior knee joint soft tissue swelling probable due to joint effusion. No acute fracture.  IMPRESSION: No acute fracture. There is diffuse mild narrowing of the joint space.  There  is superior displacement of the patella.  This is highly suspicious for patellar tendon rupture.  Clinical correlation is necessary.  There is significant infrapatellar and anterior knee joint soft tissue swelling probable due to joint effusion. Further evaluation with MRI is recommended.   Original Report Authenticated By: Natasha Mead, M.D.      MDM  Dilaudid 1 mg iv. zofran iv.  Xr.  Ice/elevate.   Reviewed  nursing notes and prior charts for additional history.   Pt still guarded as to full knee exam.  No gross ligament laxity noted/grossly stable.  No increased swelling from prior exam. Distal pulses palp.    Pt states his orthopedist is Dr Ranell Patrick - discussed case with Dr Charlann Boxer on call including suspected patella tendon rupture.  He indicates no need for mri today, to pad well, place knee immobilizer, and have pt follow up in office Monday.  Recheck pain persists but improved. Dilaudid 1 mg iv.  Knee immobilizer.  Recheck dp/pt palp. No numbness/weakness. Pt comfortable.      Suzi Roots, MD 11/06/12 [phone]

## 2012-11-11 ENCOUNTER — Encounter (HOSPITAL_COMMUNITY): Payer: Self-pay | Admitting: Pharmacy Technician

## 2012-11-11 ENCOUNTER — Encounter (HOSPITAL_COMMUNITY): Payer: Self-pay | Admitting: *Deleted

## 2012-11-11 NOTE — H&P (Signed)
Francisco Bates is an 47 y.o. male.    Procedure:  Open repair of right patella tendon  Chief Complaint:    Right patella tendon rupture   HPI:     The patient is a 47 year old male who presented to the clinic with right knee pain.  The patient reports right knee symptoms including: pain which began 4 day(s) ago following a specific injury. The injury occurred 4 day(s) ago due to a fall (after landing on a guys foot and knee buckling). The patient describes the severity of the symptoms as severe.The patient feels that the symptoms are unchanging. Prior to being seen today the patient was previously evaluated in the emergency room 4 day(s) ago. Past treatment for this problem has included knee immobilizer, nonsteroidal anti-inflammatory drugs and opioid analgesics. Symptoms are reported to be located in the right knee and include knee pain. The patient describes their pain as sharp and aching. Onset of symptoms was sudden beginning immediately after the injury with symptoms now occurring constantly. Current treatment includes knee immobilizer, nonsteroidal anti-inflammatory drugs and opioid analgesics.  History of knee arthroscopy on left knee in 95-96.   Risks, benefits and expectations were discussed with the patient. Patient understand the risks, benefits and expectations and wishes to proceed with surgery.   PCP:  Default, Provider, MD  D/C Plans:    Home with HHPT  Post-op Meds:     No Rx given  Tranexamic Acid:     To be given  Decadron:   To be given  FYI:     ASA post-op  Oxycodone post-op  PMH: Past Medical History  Diagnosis Date  . Asthma     as child - no problems since  . Rash     back  . Patellar tendon rupture     RT knee  . Recovering alcoholic     PSH: Past Surgical History  Procedure Laterality Date  . Achilles tendon surgery    . Knee surgery      1995    Social History:  reports that he has never smoked. He does not have any smokeless tobacco history on  file. He reports that he does not drink alcohol or use illicit drugs.  Allergies:  No Known Allergies  Medications: No current facility-administered medications for this encounter.   Current Outpatient Prescriptions  Medication Sig Dispense Refill  . diclofenac (VOLTAREN) 75 MG EC tablet Take 75 mg by mouth 2 (two) times daily.      . diphenhydrAMINE (BENADRYL) 25 MG tablet Take 25 mg by mouth every 6 (six) hours as needed for itching or allergies.      Marland Kitchen ibuprofen (ADVIL,MOTRIN) 200 MG tablet Take 200 mg by mouth every 6 (six) hours as needed for pain.      . Multiple Vitamin (MULTIVITAMIN WITH MINERALS) TABS tablet Take 1 tablet by mouth daily.      Marland Kitchen oxyCODONE-acetaminophen (PERCOCET/ROXICET) 5-325 MG per tablet Take 1 tablet by mouth every 4 (four) hours as needed for pain.         Review of Systems  Constitutional: Negative.   HENT: Negative.   Eyes: Negative.   Respiratory: Negative.   Cardiovascular: Negative.   Gastrointestinal: Negative.   Genitourinary: Negative.   Musculoskeletal: Positive for joint pain.  Skin: Positive for rash.  Neurological: Negative.   Endo/Heme/Allergies: Negative.   Psychiatric/Behavioral: Negative.       Physical Exam  Constitutional: He is oriented to person, place, and time  and well-developed, well-nourished, and in no distress.  HENT:  Head: Normocephalic and atraumatic.  Mouth/Throat: Oropharynx is clear and moist.  Eyes: Pupils are equal, round, and reactive to light.  Neck: Neck supple. No JVD present. No tracheal deviation present. No thyromegaly present.  Cardiovascular: Normal rate and intact distal pulses.   Pulmonary/Chest: Effort normal and breath sounds normal. No respiratory distress. He has no wheezes.  Abdominal: Soft. There is no tenderness. There is no guarding.  Musculoskeletal:       Right knee: He exhibits decreased range of motion, swelling, effusion, deformity, abnormal alignment, abnormal patellar mobility and  bony tenderness. He exhibits no ecchymosis and no erythema. Tenderness found. Patellar tendon tenderness noted.  Lymphadenopathy:    He has no cervical adenopathy.  Neurological: He is alert and oriented to person, place, and time.  Skin: Skin is warm and dry.  Psychiatric: Affect normal.      Assessment/Plan Assessment:     Right patella tendon rupture   Plan: Patient will undergo a open repair of right patella tendon on 11/15/2012 per Dr. Charlann Boxer at [facility]. Risks benefits and expectations were discussed with the patient. Patient understand risks, benefits and expectations and wishes to proceed.   Anastasio Auerbach Salvatrice Morandi   PAC  11/11/2012, 5:05 PM

## 2012-11-11 NOTE — Progress Notes (Signed)
Need orders please - pt surg 11/15/12 - will be SAME DAY pt

## 2012-11-15 ENCOUNTER — Ambulatory Visit (HOSPITAL_COMMUNITY): Payer: Self-pay | Admitting: Anesthesiology

## 2012-11-15 ENCOUNTER — Encounter (HOSPITAL_COMMUNITY): Payer: Self-pay | Admitting: *Deleted

## 2012-11-15 ENCOUNTER — Inpatient Hospital Stay (HOSPITAL_COMMUNITY)
Admission: RE | Admit: 2012-11-15 | Discharge: 2012-11-16 | DRG: 501 | Disposition: A | Discharge status: Home Health | Payer: MEDICAID | Source: Ambulatory Visit | Attending: Orthopedic Surgery | Admitting: Orthopedic Surgery

## 2012-11-15 ENCOUNTER — Encounter (HOSPITAL_COMMUNITY): Payer: Self-pay | Admitting: Anesthesiology

## 2012-11-15 ENCOUNTER — Encounter (HOSPITAL_COMMUNITY)
Admission: RE | Disposition: A | Discharge status: Home Health | Payer: Self-pay | Source: Ambulatory Visit | Attending: Orthopedic Surgery

## 2012-11-15 DIAGNOSIS — D62 Acute posthemorrhagic anemia: Secondary | ICD-10-CM | POA: Diagnosis not present

## 2012-11-15 DIAGNOSIS — S838X9A Sprain of other specified parts of unspecified knee, initial encounter: Principal | ICD-10-CM | POA: Diagnosis present

## 2012-11-15 DIAGNOSIS — D500 Iron deficiency anemia secondary to blood loss (chronic): Secondary | ICD-10-CM | POA: Diagnosis not present

## 2012-11-15 DIAGNOSIS — S86819A Strain of other muscle(s) and tendon(s) at lower leg level, unspecified leg, initial encounter: Secondary | ICD-10-CM

## 2012-11-15 DIAGNOSIS — S86811S Strain of other muscle(s) and tendon(s) at lower leg level, right leg, sequela: Secondary | ICD-10-CM

## 2012-11-15 DIAGNOSIS — R296 Repeated falls: Secondary | ICD-10-CM | POA: Diagnosis present

## 2012-11-15 DIAGNOSIS — E669 Obesity, unspecified: Secondary | ICD-10-CM | POA: Diagnosis present

## 2012-11-15 DIAGNOSIS — Z6831 Body mass index (BMI) 31.0-31.9, adult: Secondary | ICD-10-CM

## 2012-11-15 HISTORY — PX: PATELLAR TENDON REPAIR: SHX737

## 2012-11-15 HISTORY — DX: Alcohol dependence, in remission: F10.21

## 2012-11-15 HISTORY — DX: Rash and other nonspecific skin eruption: R21

## 2012-11-15 HISTORY — DX: Unspecified asthma, uncomplicated: J45.909

## 2012-11-15 HISTORY — DX: Strain of other muscle(s) and tendon(s) at lower leg level, unspecified leg, initial encounter: S86.819A

## 2012-11-15 LAB — URINE MICROSCOPIC-ADD ON

## 2012-11-15 LAB — BASIC METABOLIC PANEL
BUN: 15 mg/dL (ref 6–23)
CO2: 27 mEq/L (ref 19–32)
Calcium: 9.9 mg/dL (ref 8.4–10.5)
Chloride: 99 mEq/L (ref 96–112)
Creatinine, Ser: 0.81 mg/dL (ref 0.50–1.35)

## 2012-11-15 LAB — URINALYSIS, ROUTINE W REFLEX MICROSCOPIC
Glucose, UA: NEGATIVE mg/dL
Specific Gravity, Urine: 1.031 — ABNORMAL HIGH (ref 1.005–1.030)
pH: 5.5 (ref 5.0–8.0)

## 2012-11-15 LAB — CBC
HCT: 41.4 % (ref 39.0–52.0)
MCH: 30.7 pg (ref 26.0–34.0)
MCV: 87 fL (ref 78.0–100.0)
RBC: 4.76 MIL/uL (ref 4.22–5.81)
WBC: 7.5 10*3/uL (ref 4.0–10.5)

## 2012-11-15 SURGERY — REPAIR, TENDON, PATELLAR
Anesthesia: Spinal | Site: Knee | Laterality: Right | Wound class: Clean

## 2012-11-15 MED ORDER — FENTANYL CITRATE 0.05 MG/ML IJ SOLN
INTRAMUSCULAR | Status: DC | PRN
  Administered 2012-11-15 (×2): 50 ug via INTRAVENOUS

## 2012-11-15 MED ORDER — METOCLOPRAMIDE HCL 5 MG/ML IJ SOLN: 5.0000 mg | Freq: Three times a day (TID) | INTRAMUSCULAR | Status: DC | PRN

## 2012-11-15 MED ORDER — HYDROMORPHONE HCL PF 1 MG/ML IJ SOLN
0.5000 mg | INTRAMUSCULAR | Status: DC | PRN
  Administered 2012-11-15 – 2012-11-16 (×3): 2 mg via INTRAVENOUS
  Administered 2012-11-16: 1 mg via INTRAVENOUS
  Administered 2012-11-16: 2 mg via INTRAVENOUS
  Filled 2012-11-15: qty 1
  Filled 2012-11-15 (×4): qty 2

## 2012-11-15 MED ORDER — LABETALOL HCL 5 MG/ML IV SOLN
INTRAVENOUS | Status: DC | PRN
  Administered 2012-11-15: 2.5 mg via INTRAVENOUS

## 2012-11-15 MED ORDER — KETOROLAC TROMETHAMINE 30 MG/ML IJ SOLN
30.0000 mg | Freq: Once | INTRAMUSCULAR | Status: AC
  Administered 2012-11-15: 30 mg via INTRAVENOUS

## 2012-11-15 MED ORDER — HYDROMORPHONE HCL PF 1 MG/ML IJ SOLN
INTRAMUSCULAR | Status: AC
  Filled 2012-11-15: qty 1

## 2012-11-15 MED ORDER — 0.9 % SODIUM CHLORIDE (POUR BTL) OPTIME
TOPICAL | Status: DC | PRN
  Administered 2012-11-15: 1000 mL

## 2012-11-15 MED ORDER — PROMETHAZINE HCL 25 MG/ML IJ SOLN: 6.2500 mg | INTRAMUSCULAR | Status: DC | PRN

## 2012-11-15 MED ORDER — MENTHOL 3 MG MT LOZG: 1.0000 | LOZENGE | OROMUCOSAL | Status: DC | PRN

## 2012-11-15 MED ORDER — PHENOL 1.4 % MT LIQD: 1.0000 | OROMUCOSAL | Status: DC | PRN

## 2012-11-15 MED ORDER — OXYCODONE HCL 5 MG PO TABS: 5.0000 mg | ORAL_TABLET | Freq: Once | ORAL | Status: DC | PRN

## 2012-11-15 MED ORDER — ALUM & MAG HYDROXIDE-SIMETH 200-200-20 MG/5ML PO SUSP: 30.0000 mL | ORAL | Status: DC | PRN

## 2012-11-15 MED ORDER — MEPERIDINE HCL 50 MG/ML IJ SOLN: 6.2500 mg | INTRAMUSCULAR | Status: DC | PRN

## 2012-11-15 MED ORDER — MIDAZOLAM HCL 5 MG/5ML IJ SOLN
INTRAMUSCULAR | Status: DC | PRN
  Administered 2012-11-15: 2 mg via INTRAVENOUS

## 2012-11-15 MED ORDER — CEFAZOLIN SODIUM-DEXTROSE 2-3 GM-% IV SOLR
2.0000 g | INTRAVENOUS | Status: AC
  Administered 2012-11-15: 2 g via INTRAVENOUS

## 2012-11-15 MED ORDER — ZOLPIDEM TARTRATE 5 MG PO TABS
5.0000 mg | ORAL_TABLET | Freq: Every evening | ORAL | Status: DC | PRN
  Administered 2012-11-15: 5 mg via ORAL
  Filled 2012-11-15: qty 1

## 2012-11-15 MED ORDER — METOPROLOL TARTRATE 50 MG PO TABS
50.0000 mg | ORAL_TABLET | Freq: Two times a day (BID) | ORAL | Status: DC
  Administered 2012-11-15 – 2012-11-16 (×2): 50 mg via ORAL
  Filled 2012-11-15 (×3): qty 1

## 2012-11-15 MED ORDER — HYDROMORPHONE HCL PF 1 MG/ML IJ SOLN
INTRAMUSCULAR | Status: AC
  Administered 2012-11-16: 1 mg via INTRAVENOUS
  Filled 2012-11-15: qty 1

## 2012-11-15 MED ORDER — ONDANSETRON HCL 4 MG PO TABS: 4.0000 mg | ORAL_TABLET | Freq: Four times a day (QID) | ORAL | Status: DC | PRN

## 2012-11-15 MED ORDER — METHOCARBAMOL 100 MG/ML IJ SOLN
500.0000 mg | Freq: Four times a day (QID) | INTRAMUSCULAR | Status: DC | PRN
  Administered 2012-11-15: 500 mg via INTRAVENOUS
  Filled 2012-11-15: qty 5

## 2012-11-15 MED ORDER — METOCLOPRAMIDE HCL 10 MG PO TABS: 5.0000 mg | ORAL_TABLET | Freq: Three times a day (TID) | ORAL | Status: DC | PRN

## 2012-11-15 MED ORDER — PROPOFOL 10 MG/ML IV BOLUS
INTRAVENOUS | Status: DC | PRN
  Administered 2012-11-15 (×2): 20 mg via INTRAVENOUS

## 2012-11-15 MED ORDER — CELECOXIB 200 MG PO CAPS
200.0000 mg | ORAL_CAPSULE | Freq: Two times a day (BID) | ORAL | Status: DC
  Administered 2012-11-15: 200 mg via ORAL
  Filled 2012-11-15 (×3): qty 1

## 2012-11-15 MED ORDER — KETOROLAC TROMETHAMINE 30 MG/ML IJ SOLN
INTRAMUSCULAR | Status: AC
  Filled 2012-11-15: qty 1

## 2012-11-15 MED ORDER — LACTATED RINGERS IV SOLN
INTRAVENOUS | Status: AC
  Administered 2012-11-15: 1000 mL via INTRAVENOUS
  Administered 2012-11-15: 17:00:00 via INTRAVENOUS

## 2012-11-15 MED ORDER — FENTANYL CITRATE 0.05 MG/ML IJ SOLN
INTRAMUSCULAR | Status: AC
  Filled 2012-11-15: qty 2

## 2012-11-15 MED ORDER — FLEET ENEMA 7-19 GM/118ML RE ENEM: 1.0000 | ENEMA | Freq: Once | RECTAL | Status: AC | PRN

## 2012-11-15 MED ORDER — CHLORHEXIDINE GLUCONATE 4 % EX LIQD
60.0000 mL | Freq: Once | CUTANEOUS | Status: DC
  Filled 2012-11-15: qty 60

## 2012-11-15 MED ORDER — OXYCODONE HCL 5 MG PO TABS
5.0000 mg | ORAL_TABLET | ORAL | Status: DC
  Administered 2012-11-15: 10 mg via ORAL
  Administered 2012-11-15: 5 mg via ORAL
  Administered 2012-11-16 (×5): 15 mg via ORAL
  Filled 2012-11-15 (×3): qty 3
  Filled 2012-11-15: qty 2
  Filled 2012-11-15: qty 1
  Filled 2012-11-15 (×3): qty 3

## 2012-11-15 MED ORDER — SODIUM CHLORIDE 0.9 % IV SOLN
INTRAVENOUS | Status: DC
  Administered 2012-11-15: 23:00:00 via INTRAVENOUS
  Filled 2012-11-15 (×4): qty 1000

## 2012-11-15 MED ORDER — BUPIVACAINE IN DEXTROSE 0.75-8.25 % IT SOLN
INTRATHECAL | Status: DC | PRN
  Administered 2012-11-15: 2 mL via INTRATHECAL

## 2012-11-15 MED ORDER — FERROUS SULFATE 325 (65 FE) MG PO TABS
325.0000 mg | ORAL_TABLET | Freq: Three times a day (TID) | ORAL | Status: DC
  Administered 2012-11-16: 325 mg via ORAL
  Filled 2012-11-15 (×4): qty 1

## 2012-11-15 MED ORDER — DOCUSATE SODIUM 100 MG PO CAPS
100.0000 mg | ORAL_CAPSULE | Freq: Two times a day (BID) | ORAL | Status: DC
  Administered 2012-11-15 – 2012-11-16 (×2): 100 mg via ORAL

## 2012-11-15 MED ORDER — ONDANSETRON HCL 4 MG/2ML IJ SOLN: 4.0000 mg | Freq: Four times a day (QID) | INTRAMUSCULAR | Status: DC | PRN

## 2012-11-15 MED ORDER — DIPHENHYDRAMINE HCL 25 MG PO TABS
25.0000 mg | ORAL_TABLET | Freq: Four times a day (QID) | ORAL | Status: DC | PRN
  Filled 2012-11-15: qty 1

## 2012-11-15 MED ORDER — METHOCARBAMOL 500 MG PO TABS
500.0000 mg | ORAL_TABLET | Freq: Four times a day (QID) | ORAL | Status: DC | PRN
  Administered 2012-11-16 (×3): 500 mg via ORAL
  Filled 2012-11-15 (×3): qty 1

## 2012-11-15 MED ORDER — BISACODYL 10 MG RE SUPP: 10.0000 mg | Freq: Every day | RECTAL | Status: DC | PRN

## 2012-11-15 MED ORDER — HYDROMORPHONE HCL PF 1 MG/ML IJ SOLN
0.2500 mg | INTRAMUSCULAR | Status: DC | PRN
  Administered 2012-11-15 (×4): 0.5 mg via INTRAVENOUS

## 2012-11-15 MED ORDER — OXYCODONE HCL 5 MG/5ML PO SOLN: 5.0000 mg | Freq: Once | ORAL | Status: DC | PRN

## 2012-11-15 MED ORDER — FENTANYL CITRATE 0.05 MG/ML IJ SOLN
25.0000 ug | INTRAMUSCULAR | Status: DC | PRN
  Administered 2012-11-15 (×2): 50 ug via INTRAVENOUS

## 2012-11-15 MED ORDER — ASPIRIN EC 325 MG PO TBEC
325.0000 mg | DELAYED_RELEASE_TABLET | Freq: Two times a day (BID) | ORAL | Status: DC
  Administered 2012-11-16: 325 mg via ORAL
  Filled 2012-11-15 (×3): qty 1

## 2012-11-15 MED ORDER — PROPOFOL INFUSION 10 MG/ML OPTIME
INTRAVENOUS | Status: DC | PRN
  Administered 2012-11-15: 75 ug/kg/min via INTRAVENOUS

## 2012-11-15 MED ORDER — POLYETHYLENE GLYCOL 3350 17 G PO PACK
17.0000 g | PACK | Freq: Two times a day (BID) | ORAL | Status: DC
  Administered 2012-11-15: 17 g via ORAL

## 2012-11-15 MED ORDER — CEFAZOLIN SODIUM-DEXTROSE 2-3 GM-% IV SOLR
INTRAVENOUS | Status: AC
  Filled 2012-11-15: qty 50

## 2012-11-15 MED ORDER — CEFAZOLIN SODIUM-DEXTROSE 2-3 GM-% IV SOLR
2.0000 g | Freq: Four times a day (QID) | INTRAVENOUS | Status: AC
  Administered 2012-11-15 – 2012-11-16 (×2): 2 g via INTRAVENOUS
  Filled 2012-11-15 (×2): qty 50

## 2012-11-15 SURGICAL SUPPLY — 52 items
ADH SKN CLS APL DERMABOND .7 (GAUZE/BANDAGES/DRESSINGS) ×1
BAG SPEC THK2 15X12 ZIP CLS (MISCELLANEOUS) ×1
BAG ZIPLOCK 12X15 (MISCELLANEOUS) ×2 IMPLANT
BANDAGE ELASTIC 6 VELCRO ST LF (GAUZE/BANDAGES/DRESSINGS) ×2 IMPLANT
BANDAGE ESMARK 6X9 LF (GAUZE/BANDAGES/DRESSINGS) ×1 IMPLANT
BIT DRILL 2.4X128 (BIT) ×2 IMPLANT
BNDG ADH 5X4 AIR PERM ELC (GAUZE/BANDAGES/DRESSINGS) ×1
BNDG CMPR 9X6 STRL LF SNTH (GAUZE/BANDAGES/DRESSINGS) ×1
BNDG CMPR MED 10X6 ELC LF (GAUZE/BANDAGES/DRESSINGS) ×1
BNDG COHESIVE 4X5 WHT NS (GAUZE/BANDAGES/DRESSINGS) ×1 IMPLANT
BNDG ELASTIC 6X10 VLCR STRL LF (GAUZE/BANDAGES/DRESSINGS) ×1 IMPLANT
BNDG ESMARK 6X9 LF (GAUZE/BANDAGES/DRESSINGS) ×2
CLOTH BEACON ORANGE TIMEOUT ST (SAFETY) ×2 IMPLANT
CUFF TOURN SGL QUICK 34 (TOURNIQUET CUFF) ×2
CUFF TRNQT CYL 34X4X40X1 (TOURNIQUET CUFF) ×1 IMPLANT
DERMABOND ADVANCED (GAUZE/BANDAGES/DRESSINGS) ×1
DERMABOND ADVANCED .7 DNX12 (GAUZE/BANDAGES/DRESSINGS) ×1 IMPLANT
DRSG AQUACEL AG ADV 3.5X10 (GAUZE/BANDAGES/DRESSINGS) ×2 IMPLANT
DURAPREP 26ML APPLICATOR (WOUND CARE) ×2 IMPLANT
ELECT REM PT RETURN 9FT ADLT (ELECTROSURGICAL) ×2
ELECTRODE REM PT RTRN 9FT ADLT (ELECTROSURGICAL) ×1 IMPLANT
FACESHIELD LNG OPTICON STERILE (SAFETY) ×4 IMPLANT
GLOVE BIOGEL PI IND STRL 7.5 (GLOVE) ×1 IMPLANT
GLOVE BIOGEL PI IND STRL 8 (GLOVE) ×1 IMPLANT
GLOVE BIOGEL PI INDICATOR 7.5 (GLOVE) ×1
GLOVE BIOGEL PI INDICATOR 8 (GLOVE) ×1
GLOVE ECLIPSE 8.0 STRL XLNG CF (GLOVE) IMPLANT
GLOVE ORTHO TXT STRL SZ7.5 (GLOVE) ×4 IMPLANT
GOWN BRE IMP PREV XXLGXLNG (GOWN DISPOSABLE) ×4 IMPLANT
GOWN PREVENTION PLUS XXLARGE (GOWN DISPOSABLE) ×2 IMPLANT
GOWN STRL NON-REIN LRG LVL3 (GOWN DISPOSABLE) ×4 IMPLANT
IMMOBILIZER KNEE 20 (SOFTGOODS) ×2
IMMOBILIZER KNEE 20 THIGH 36 (SOFTGOODS) IMPLANT
MANIFOLD NEPTUNE II (INSTRUMENTS) ×2 IMPLANT
NS IRRIG 1000ML POUR BTL (IV SOLUTION) ×2 IMPLANT
PACK TOTAL JOINT (CUSTOM PROCEDURE TRAY) ×2 IMPLANT
PASSER SUT SWANSON 36MM LOOP (INSTRUMENTS) ×2 IMPLANT
POSITIONER SURGICAL ARM (MISCELLANEOUS) ×2 IMPLANT
SUT ETHIBOND NAB CT1 #1 30IN (SUTURE) ×2 IMPLANT
SUT FIBERWIRE #2 38 T-5 BLUE (SUTURE) ×8
SUT FIBERWIRE 2-0 18 17.9 3/8 (SUTURE)
SUT MNCRL AB 4-0 PS2 18 (SUTURE) ×2 IMPLANT
SUT VIC AB 0 CT1 27 (SUTURE)
SUT VIC AB 0 CT1 27XBRD ANTBC (SUTURE) ×2 IMPLANT
SUT VIC AB 1 CT1 36 (SUTURE) ×2 IMPLANT
SUT VIC AB 2-0 CT1 27 (SUTURE) ×6
SUT VIC AB 2-0 CT1 TAPERPNT 27 (SUTURE) ×2 IMPLANT
SUTURE FIBERWR #2 38 T-5 BLUE (SUTURE) IMPLANT
SUTURE FIBERWR 2-0 18 17.9 3/8 (SUTURE) ×2 IMPLANT
TOWEL OR 17X26 10 PK STRL BLUE (TOWEL DISPOSABLE) ×3 IMPLANT
TOWEL OR NON WOVEN STRL DISP B (DISPOSABLE) ×2 IMPLANT
WATER STERILE IRR 1500ML POUR (IV SOLUTION) ×2 IMPLANT

## 2012-11-15 NOTE — Transfer of Care (Signed)
Immediate Anesthesia Transfer of Care Note  Patient: Francisco Bates  Procedure(s) Performed: Procedure(s) (LRB): OPEN REPAIR RIGHT PATELLA TENDON  (Right)  Patient Location: PACU  Anesthesia Type: Spinal  Level of Consciousness: sedated, patient cooperative and responds to stimulaton  Airway & Oxygen Therapy: Patient Spontanous Breathing and Patient connected to face mask oxgen  Post-op Assessment: Report given to PACU RN and Post -op Vital signs reviewed and stable  Post vital signs: Reviewed and stable Level L2 on exam in PACU unable to move ext. On assessment, patient without of pain.  Complications: No apparent anesthesia complications

## 2012-11-15 NOTE — Interval H&P Note (Signed)
History and Physical Interval Note:  11/15/2012 4:24 PM  Francisco Bates  has presented today for surgery, with the diagnosis of right knee patella tendon rupture  The various methods of treatment have been discussed with the patient and family. After consideration of risks, benefits and other options for treatment, the patient has consented to  Procedure(s): OPEN REPAIR RIGHT PATELLA TENDON  (Right) as a surgical intervention .  The patient's history has been reviewed, patient examined, no change in status, stable for surgery.  I have reviewed the patient's chart and labs.  Questions were answered to the patient's satisfaction.     [name]

## 2012-11-15 NOTE — Anesthesia Preprocedure Evaluation (Addendum)
Anesthesia Evaluation  Patient identified by MRN, date of birth, ID band Patient awake    Reviewed: Allergy & Precautions, H&P , NPO status , Patient's Chart, lab work & pertinent test results  Airway Mallampati: II TM Distance: >3 FB Neck ROM: Full    Dental  (+) Dental Advisory Given and Teeth Intact   Pulmonary neg pulmonary ROS, asthma ,  breath sounds clear to auscultation        Cardiovascular negative cardio ROS  Rhythm:Regular Rate:Normal     Neuro/Psych PSYCHIATRIC DISORDERS negative neurological ROS  negative psych ROS   GI/Hepatic negative GI ROS, (+)     substance abuse  alcohol use,   Endo/Other  negative endocrine ROS  Renal/GU negative Renal ROS     Musculoskeletal negative musculoskeletal ROS (+)   Abdominal (+) + obese,   Peds  Hematology negative hematology ROS (+)   Anesthesia Other Findings   Reproductive/Obstetrics negative OB ROS                          Anesthesia Physical Anesthesia Plan  ASA: II  Anesthesia Plan: Spinal   Post-op Pain Management:    Induction:   Airway Management Planned:   Additional Equipment:   Intra-op Plan:   Post-operative Plan:   Informed Consent: I have reviewed the patients History and Physical, chart, labs and discussed the procedure including the risks, benefits and alternatives for the proposed anesthesia with the patient or authorized representative who has indicated his/her understanding and acceptance.   Dental advisory given  Plan Discussed with: CRNA  Anesthesia Plan Comments:         Anesthesia Quick Evaluation

## 2012-11-15 NOTE — Brief Op Note (Signed)
11/15/2012  6:59 PM  PATIENT:  Francisco Bates  47 y.o. male  PRE-OPERATIVE DIAGNOSIS:  right knee patella tendon rupture  POST-OPERATIVE DIAGNOSIS:  right knee patella tendon rupture, midsubstance  PROCEDURE:  Procedure(s): OPEN REPAIR RIGHT PATELLA TENDON  (Right)  SURGEON:  Surgeon(s) and Role:    * [name], MD - Primary  PHYSICIAN ASSISTANT: Lanney Gins, PA-C  ANESTHESIA:   spinal  EBL:  Total I/O In: 1100 [I.V.:1100] Out: -   BLOOD ADMINISTERED:none  DRAINS: none   LOCAL MEDICATIONS USED:  NONE  SPECIMEN:  No Specimen  DISPOSITION OF SPECIMEN:  N/A  COUNTS:  YES  TOURNIQUET:   Total Tourniquet Time Documented: area (laterality) - 60 minutes Total: area (laterality) - 60 minutes   DICTATION: .Other Dictation: Dictation Number [id number]  PLAN OF CARE: Admit for overnight observation  PATIENT DISPOSITION:  PACU - hemodynamically stable.   Delay start of Pharmacological VTE agent (>24hrs) due to surgical blood loss or risk of bleeding: no

## 2012-11-15 NOTE — Anesthesia Postprocedure Evaluation (Signed)
Anesthesia Post Note  Patient: Francisco Bates  Procedure(s) Performed: Procedure(s) (LRB): OPEN REPAIR RIGHT PATELLA TENDON  (Right)  Anesthesia type: Spinal  Patient location: PACU  Post pain: Pain level controlled  Post assessment: Post-op Vital signs reviewed  Last Vitals: BP 139/85  Pulse 80  Temp(Src) 36.6 C (Oral)  Resp 20  Ht 6' (1.829 m)  Wt 230 lb (104.327 kg)  BMI 31.19 kg/m2  SpO2 100%  Post vital signs: Reviewed  Level of consciousness: sedated  Complications: No apparent anesthesia complications

## 2012-11-15 NOTE — Anesthesia Procedure Notes (Addendum)
Spinal  Patient location during procedure: OR Start time: 11/15/2012 4:32 PM End time: 11/15/2012 4:37 PM Staffing CRNA/Resident: Paris Lore Performed by: resident/CRNA  Preanesthetic Checklist Completed: patient identified, site marked, surgical consent, pre-op evaluation, timeout performed, IV checked, risks and benefits discussed and monitors and equipment checked Spinal Block Patient position: sitting Prep: ChloraPrep Patient monitoring: heart rate, continuous pulse ox and blood pressure Location: L2-3 Injection technique: single-shot Needle Needle type: Sprotte  Needle gauge: 24 G Needle length: 9 cm Needle insertion depth: 7 cm Assessment Sensory level: T4 Additional Notes Expiration date of kit checked and confirmed. Patient tolerated procedure well, without complications. X 1 attempt at L2-L3 with noted clear CSF return easy aspiration and administration of medication.

## 2012-11-16 ENCOUNTER — Ambulatory Visit: Payer: Self-pay

## 2012-11-16 ENCOUNTER — Encounter (HOSPITAL_COMMUNITY): Payer: Self-pay | Admitting: Orthopedic Surgery

## 2012-11-16 DIAGNOSIS — E669 Obesity, unspecified: Secondary | ICD-10-CM | POA: Diagnosis present

## 2012-11-16 DIAGNOSIS — D500 Iron deficiency anemia secondary to blood loss (chronic): Secondary | ICD-10-CM | POA: Diagnosis not present

## 2012-11-16 LAB — CBC
HCT: 34.8 % — ABNORMAL LOW (ref 39.0–52.0)
Hemoglobin: 12.1 g/dL — ABNORMAL LOW (ref 13.0–17.0)
MCV: 87 fL (ref 78.0–100.0)
RBC: 4 MIL/uL — ABNORMAL LOW (ref 4.22–5.81)
RDW: 12.5 % (ref 11.5–15.5)
WBC: 7.2 10*3/uL (ref 4.0–10.5)

## 2012-11-16 LAB — TYPE AND SCREEN
ABO/RH(D): O NEG
Antibody Screen: NEGATIVE

## 2012-11-16 LAB — BASIC METABOLIC PANEL
BUN: 13 mg/dL (ref 6–23)
CO2: 27 mEq/L (ref 19–32)
Chloride: 101 mEq/L (ref 96–112)
Creatinine, Ser: 0.75 mg/dL (ref 0.50–1.35)
GFR calc Af Amer: >90 mL/min (ref >90)
Potassium: 3.7 mEq/L (ref 3.5–5.1)

## 2012-11-16 MED ORDER — OXYCODONE HCL 5 MG PO TABS: 5.0000 mg | ORAL_TABLET | ORAL | Status: DC | PRN

## 2012-11-16 MED ORDER — METHOCARBAMOL 500 MG PO TABS: 500.0000 mg | ORAL_TABLET | Freq: Four times a day (QID) | ORAL | Status: DC | PRN

## 2012-11-16 MED ORDER — ASPIRIN 325 MG PO TBEC: 325.0000 mg | DELAYED_RELEASE_TABLET | Freq: Two times a day (BID) | ORAL | Status: AC

## 2012-11-16 MED ORDER — DSS 100 MG PO CAPS: 100.0000 mg | ORAL_CAPSULE | Freq: Two times a day (BID) | ORAL | Status: DC

## 2012-11-16 MED ORDER — POLYETHYLENE GLYCOL 3350 17 G PO PACK: 17.0000 g | PACK | Freq: Two times a day (BID) | ORAL | Status: DC

## 2012-11-16 MED ORDER — CELECOXIB 200 MG PO CAPS: 200.0000 mg | ORAL_CAPSULE | Freq: Two times a day (BID) | ORAL | Status: DC

## 2012-11-16 MED ORDER — KETOROLAC TROMETHAMINE 15 MG/ML IJ SOLN
15.0000 mg | INTRAMUSCULAR | Status: AC
  Administered 2012-11-16 (×3): 15 mg via INTRAVENOUS
  Filled 2012-11-16 (×3): qty 1

## 2012-11-16 MED ORDER — FERROUS SULFATE 325 (65 FE) MG PO TABS: 325.0000 mg | ORAL_TABLET | Freq: Three times a day (TID) | ORAL | Status: DC

## 2012-11-16 NOTE — Progress Notes (Signed)
[facility]  [facility] is providing the following services: RW  If patient discharges after hours, please call [phone].   Renard Hamper 11/16/2012, 2:12 PM

## 2012-11-16 NOTE — Progress Notes (Signed)
Physical Therapy Treatment Patient Details Name: NATHON STEFANSKI MRN: [id number] DOB: 1965-08-17 Today's Date: 11/16/2012 Time: 7829-5621 PT Time Calculation (min): 22 min  PT Assessment / Plan / Recommendation  History of Present Illness 47 y.o. male admitted with R patellar tendon rupture, s/p repair 11/15/12   PT Comments   Pt tolerated increased distance for ambulation. Pt plans DC today. Pt declined need for practice of steps at this time.  Follow Up Recommendations  Outpatient PT     Does the patient have the potential to tolerate intense rehabilitation     Barriers to Discharge        Equipment Recommendations  Rolling walker with 5" wheels    Recommendations for Other Services    Frequency 7X/week   Progress towards PT Goals Progress towards PT goals: Progressing toward goals  Plan Current plan remains appropriate    Precautions / Restrictions Precautions Precaution Comments: patellar tendon rupture Required Braces or Orthoses: Knee Immobilizer - Right Knee Immobilizer - Right: On at all times Restrictions Weight Bearing Restrictions: No Other Position/Activity Restrictions: WBAT   Pertinent Vitals/Pain 6 R knee when stood up    Mobility  Bed Mobility Bed Mobility: Sit to Supine Supine to Sit: 4: Min assist Sit to Supine: 4: Min assist Details for Bed Mobility Assistance: assist RLE onto bed.attempted lifting with L foot but could not Transfers Transfers: Sit to Stand;Stand to Sit Sit to Stand: 5: Supervision;From chair/3-in-1;With upper extremity assist;With armrests Stand to Sit: To bed;With upper extremity assist Details for Transfer Assistance: min/guard for balance/safety Ambulation/Gait Ambulation/Gait Assistance: 5: Supervision Ambulation Distance (Feet): 80 Feet Assistive device: Rolling walker Ambulation/Gait Assistance Details: decreased weight on  R leg per pt due to pain Gait Pattern: Step-through pattern;Antalgic Gait velocity: decreased 2*  pain General Gait Details: distance limited by pain of 8/10 R knee, pt able to partially WB thru RLE but with heavy use of UEs on RW Stairs:  (verbally reviewed.)    Exercises Total Joint Exercises Ankle Circles/Pumps: AROM;Both;10 reps Quad Sets: AROM;Right;5 reps (pt reported pain in patellar tendon with quad set)   PT Diagnosis: Difficulty walking;Acute pain  PT Problem List: Decreased strength;Decreased activity tolerance;Decreased mobility;Pain;Decreased knowledge of use of DME PT Treatment Interventions: Gait training;DME instruction;Functional mobility training;Therapeutic activities;Therapeutic exercise;Patient/family education   PT Goals (current goals can now be found in the care plan section) Acute Rehab PT Goals Patient Stated Goal: decrease pain, "I've never felt pain like this before". PT Goal Formulation: With patient Time For Goal Achievement: 11/23/12 Potential to Achieve Goals: Good  Visit Information  Last PT Received On: 11/16/12 Assistance Needed: +1 History of Present Illness: 47 y.o. male admitted with R patellar tendon rupture, s/p repair 11/15/12    Subjective Data  Patient Stated Goal: decrease pain, "I've never felt pain like this before".   Cognition  Cognition Arousal/Alertness: Awake/alert    Balance     End of Session PT - End of Session Equipment Utilized During Treatment: Gait belt Activity Tolerance: Patient tolerated treatment well Patient left: in bed;with call bell/phone within reach Nurse Communication: Mobility status   GP     Rada Hay 11/16/2012, 3:13 PM

## 2012-11-16 NOTE — Op Note (Signed)
NAMERUDOLPH, DAOUST NO.:  [id number]  MEDICAL RECORD NO.:  [id number]  LOCATION:  1618                         FACILITY:  [facility]  PHYSICIAN:  Madlyn Frankel. Charlann Boxer, M.D.  DATE OF BIRTH:  1965/05/21  DATE OF PROCEDURE:  11/15/2012 DATE OF DISCHARGE:                              OPERATIVE REPORT   PREOPERATIVE DIAGNOSIS:  Right patellar tendon rupture.  POSTOPERATIVE DIAGNOSIS:  Right patellar tendon rupture.  FINDINGS:  Patient had a mid-substance patellar tendon rupture as opposed to an avulsion of the inferior pole of patella or at its insertion on the tibial tubercle.  SURGEON:  Madlyn Frankel. Charlann Boxer, M.D.  ASSISTANT:  Lanney Gins, PA-C.  Note that Francisco Bates was present for the entirety of case for preoperative positioning, perioperative management of the extremity, general facilitation of the case, and primary wound closure.  ANESTHESIA:  Spinal.  SPECIMENS:  None.  COMPLICATIONS:  None.  DRAINS:  None.  TOURNIQUET TIME:  Approximately 60 minutes at 250 mmHg.  INDICATION FOR THE PROCEDURE:  Francisco Bates is a 47 year old male who was playing sport when he landed on someone's foot and had immediate pop in his right knee, inability to bear weight, immediate swelling and pain. He was seen in emergency room and placed in knee immobilizer. Radiographs did not reveal any fracture, but he had obvious palpable defect.  Upon evaluation in the office, he has no palpable defect and inability to perform straight leg raise.  No further studies were required.  He had previously had approximately 7 years ago, an Achilles tendon rupture.  He was aware of this type of phenomenon.  It is my recommendation to undergo an open repair of the patella tendon to reapproximate the tendon and best get a chance to heal.  Questions were encouraged and reviewed the risks of infection, recurrent rupture, and need for future surgery were all discussed and reviewed.  Consent  was obtained.  PROCEDURE IN DETAIL:  The patient was brought to operative theater. Once adequate anesthesia, preoperative antibiotics, Ancef administered, he was positioned supine with the right thigh tourniquet placed.  The right lower extremity was then prepped and draped in a sterile fashion. Time-out was performed identifying the patient, planned procedure, and extremity.  Leg was exsanguinated.  Tourniquet elevated to 250 mmHg. The right foot was placed in the Mayo leg holder.  Midline incision was made.  Soft tissue dissection was carried down to the extensor mechanism finding the rupture.  Patient was noted to have a complete rupture of the mid substance of the patellar tendon.  Hematoma was evacuated.  The knee was debrided and irrigated.  Once I was able to best define the paratenon layer which I separated off the extensor mechanism, I did define also more significant medial retinacular greater than lateral retinacular tear.  I passed 2 #2 FiberWire sutures into the proximal segment of the ruptured patella tendon and 2 #2 FiberWire into the distal segment in [place] weave pattern.  With the sutures passed holding the tendon together with the opposite set of sutures, I sewed the suture ends together and then to themselves. I then completed the same for the other set of sutures,  and then I sutured the medial and lateral suture groupings together.  At this point, the patellar tendon was adequately insufficiently opposed at the ends of the rupture.  The patient has significant tendinous tissue in a horsetail type fashion around which was preserved and kept over top of the suturing.  I then used a #1 Vicryl, reapproximated the medial retinacular tear in interrupted fashion and then passed it over top of the repaired tendon in a running fashion.  I then using #1 Vicryl reapproximated the paratenon layer over the quadriceps and over the patella and a portion of the patellar tendon.   It was fairly disrupted at the inferior aspect of the patella and thus was unable to reapproximate it.  I then irrigated the knee wound out, reapproximated the remainder of wound with 2-0 Vicryl and running 4-0 Monocryl.  The knee was cleaned, dried, and dressed sterilely using Dermabond and Aquacel dressing.  The tourniquet was let down after the case.  The knee was wrapped into an Ace wrap and placed into the knee immobilizer.  He was then brought to recovery room in stable condition.  He tolerated the procedure well.  He will be seen back in the office for wound evaluation in 2 weeks.  We will make him weightbearing as tolerated, use a knee immobilizer or a T- ROM brace to keep the leg locked in extension during his recovery.     Madlyn Frankel Charlann Boxer, M.D.     MDO/MEDQ  D:  11/15/2012  T:  11/16/2012  Job:  [id number]

## 2012-11-16 NOTE — Progress Notes (Signed)
   Subjective: 1 Day Post-Op Procedure(s) (LRB): OPEN REPAIR RIGHT PATELLA TENDON  (Right)   Patient reports pain as moderate, pain not well controlled at this time. No other events throughout the night. Ready to go home if his pain is under better control and does well with PT.  Objective:   VITALS:   Filed Vitals:   11/16/12 0602  BP: 174/93  Pulse: 77  Temp: 98.7 F (37.1 C)  Resp: 18    Neurovascular intact Dorsiflexion/Plantar flexion intact Incision: dressing C/D/I No cellulitis present Compartment soft  LABS  Recent Labs  11/15/12 1310 11/16/12 0420  HGB 14.6 12.1*  HCT 41.4 34.8*  WBC 7.5 7.2  PLT 279 250     Recent Labs  11/15/12 1310 11/16/12 0420  NA 135 135  K 3.6 3.7  BUN 15 13  CREATININE 0.81 0.75  GLUCOSE 96 100*     Assessment/Plan: 1 Day Post-Op Procedure(s) (LRB): OPEN REPAIR RIGHT PATELLA TENDON  (Right) Added Toradol, 3 doses prior to going home. Order T-ROM brace locked in extension, warned that if he wants this to heal he can not bend his knee. Advance diet Up with therapy D/C IV fluids Discharge home with home health Follow up in 2 weeks at [facility]. Follow up with OLIN,Reather Steller D in 2 weeks.  Contact information:  [facility] [address] [place] [address] [phone]    Expected ABLA  Treated with iron and will observe  Obese (BMI 30-39.9) Estimated body mass index is 31.19 kg/(m^2) as calculated from the following:   Height as of this encounter: 6' (1.829 m).   Weight as of this encounter: 104.327 kg (230 lb). Patient also counseled that weight may inhibit the healing process Patient counseled that losing weight will help with future health issues      Anastasio Auerbach. Cameo Schmiesing   PAC  11/16/2012, 8:03 AM

## 2012-11-16 NOTE — Progress Notes (Signed)
OT Cancellation Note  Patient Details Name: Francisco Bates MRN: [id number] DOB: 1965-07-15   Cancelled Treatment:    Reason Eval/Treat Not Completed: OT screened, no needs identified, will sign off  Galen Manila 11/16/2012, 1:41 PM

## 2012-11-16 NOTE — Evaluation (Addendum)
Physical Therapy Evaluation Patient Details Name: Francisco Bates MRN: [id number] DOB: January 11, 1966 Today's Date: 11/16/2012 Time: 8657-8469 PT Time Calculation (min): 46 min  PT Assessment / Plan / Recommendation History of Present Illness  47 y.o. male admitted with R patellar tendon rupture, s/p repair 11/15/12  Clinical Impression  *Pain limiting activity tolerance, pt ambulated 29' with RW. Instructed pt in ankle pumps and quad sets, pain with quad sets limited tolerance. Noted swelling R ankle and pain with palpation to talofibular ligaments. Expect good progress as pain eases. OK to DC home this afternoon from PT standpoint. Outpt PT recommended once cleared by ortho to flex R knee. **    PT Assessment  Patient needs continued PT services    Follow Up Recommendations  Outpatient PT (outpatient once able to flex R knee per ortho)    Does the patient have the potential to tolerate intense rehabilitation      Barriers to Discharge        Equipment Recommendations  Rolling walker with 5" wheels    Recommendations for Other Services     Frequency 7X/week    Precautions / Restrictions Precautions Precaution Comments: patellar tendon rupture Required Braces or Orthoses: Knee Immobilizer - Right Knee Immobilizer - Right: On at all times Restrictions Weight Bearing Restrictions: No Other Position/Activity Restrictions: WBAT   Pertinent Vitals/Pain *8/10 R knee with walking, 5/10 prior to movement Ice applied, RN notified, RLE elevated**      Mobility  Bed Mobility Bed Mobility: Supine to Sit Supine to Sit: 4: Min assist Details for Bed Mobility Assistance: instructed pt to self assist RLE with LLE Transfers Transfers: Sit to Stand;Stand to Sit Sit to Stand: 4: Min guard;From bed;With upper extremity assist Stand to Sit: 4: Min guard;To chair/3-in-1;With upper extremity assist;With armrests Details for Transfer Assistance: min/guard for  balance/safety Ambulation/Gait Ambulation/Gait Assistance: 4: Min guard Ambulation Distance (Feet): 34 Feet Assistive device: Rolling walker Gait Pattern: Step-through pattern;Antalgic Gait velocity: decreased 2* pain General Gait Details: distance limited by pain of 8/10 R knee, pt able to partially WB thru RLE but with heavy use of UEs on RW Verbally REviewed Stair technique. Pt stated he's had 2 Achilles Tendon ruptures and is familiar with seated technique for ascending/descending stairs, as well as using crutches on stairs.     Exercises Total Joint Exercises Ankle Circles/Pumps: AROM;Both;10 reps Quad Sets: AROM;Right;5 reps (pt reported pain in patellar tendon with quad set)   PT Diagnosis: Difficulty walking;Acute pain  PT Problem List: Decreased strength;Decreased activity tolerance;Decreased mobility;Pain;Decreased knowledge of use of DME PT Treatment Interventions: Gait training;DME instruction;Functional mobility training;Therapeutic activities;Therapeutic exercise;Patient/family education     PT Goals(Current goals can be found in the care plan section) Acute Rehab PT Goals Patient Stated Goal: decrease pain, "I've never felt pain like this before". PT Goal Formulation: With patient Time For Goal Achievement: 11/23/12 Potential to Achieve Goals: Good  Visit Information  Last PT Received On: 11/16/12 Assistance Needed: +1 History of Present Illness: 47 y.o. male admitted with R patellar tendon rupture, s/p repair 11/15/12       Prior Functioning  Home Living Family/patient expects to be discharged to:: Private residence Living Arrangements: Spouse/significant other Available Help at Discharge: Available PRN/intermittently Type of Home: House Home Access: Stairs to enter Secretary/administrator of Steps: 6 Entrance Stairs-Rails: Right;Left Home Layout: Two level Alternate Level Stairs-Number of Steps: 14 Alternate Level Stairs-Rails: Right Home Equipment:  Crutches;Shower seat - built in Prior Function Level of Independence: Engineer, maintenance (IT)  Communication: No difficulties    Cognition  Cognition Arousal/Alertness: Awake/alert Behavior During Therapy: WFL for tasks assessed/performed Overall Cognitive Status: Within Functional Limits for tasks assessed    Extremity/Trunk Assessment Upper Extremity Assessment Upper Extremity Assessment: Overall WFL for tasks assessed Lower Extremity Assessment Lower Extremity Assessment: RLE deficits/detail RLE Deficits / Details: R knee not tested, ankle strength/ ROM WFL, pt notes some pain at R talofibular ligaments with palpation as well as swelling in R ankle, SLR 2/5; sensation intact Cervical / Trunk Assessment Cervical / Trunk Assessment: Normal   Balance    End of Session PT - End of Session Equipment Utilized During Treatment: Gait belt Activity Tolerance: Patient limited by pain Patient left: in chair;with call bell/phone within reach Nurse Communication: Mobility status  GP     Ralene Bathe Kistler 11/16/2012, 11:27 AM [phone]

## 2012-11-17 NOTE — Care Management Note (Signed)
    Page 1 of 2   11/17/2012     1:23:03 PM   CARE MANAGEMENT NOTE 11/17/2012  Patient:  Francisco Bates, Francisco Bates   Account Number:  [id number]  Date Initiated:  11/16/2012  Documentation initiated by:  Colleen Can  Subjective/Objective Assessment:   dx patella tendon rupture; open repair of patella tendon rupture     Action/Plan:   CM spoke with patient. Plans are for patient to return to his home in [place] where he will have caregiver. States he has crutches, but will need RW   Anticipated DC Date:  11/16/2012   Anticipated DC Plan:  HOME W HOME HEALTH SERVICES  In-house referral  Financial Counselor      DC Planning Services  CM consult      [facility] Choice  HOME HEALTH  DURABLE MEDICAL EQUIPMENT   Choice offered to / List presented to:  C-1 Patient      DME agency  [facility]     HH arranged  HH-2 PT      [facility] agency  [facility]   Status of service:  Completed, signed off Medicare Important Message given?   (If response is "NO", the following Medicare IM given date fields will be blank) Date Medicare IM given:   Date Additional Medicare IM given:    Discharge Disposition:  HOME W HOME HEALTH SERVICES  Per UR Regulation:  Reviewed for med. necessity/level of care/duration of stay  If discussed at Long Length of Stay Meetings, dates discussed:    Comments:

## 2012-11-17 NOTE — Discharge Summary (Signed)
Physician Discharge Summary  Patient ID: Francisco Bates MRN: [id number] DOB/AGE: 45-Dec-1967 45 y.o.  Admit date: 11/15/2012 Discharge date: 11/16/2012   Procedures:  Procedure(s) (LRB): OPEN REPAIR RIGHT PATELLA TENDON  (Right)  Attending Physician:  Dr. Durene Romans   Admission Diagnoses:   Right patella tendon rupture   Discharge Diagnoses:  Principal Problem:   Right patellar tendon rupture Active Problems:   Expected blood loss anemia   Obese  Past Medical History  Diagnosis Date  . Asthma     as child - no problems since  . Rash     back  . Patellar tendon rupture     RT knee  . Recovering alcoholic     HPI: The patient is a 47 year old male who presented to the clinic with right knee pain. The patient reports right knee symptoms including: pain which began 4 day(s) ago following a specific injury. The injury occurred 4 day(s) ago due to a fall (after landing on a guys foot and knee buckling). The patient describes the severity of the symptoms as severe.The patient feels that the symptoms are unchanging. Prior to being seen today the patient was previously evaluated in the emergency room 4 day(s) ago. Past treatment for this problem has included knee immobilizer, nonsteroidal anti-inflammatory drugs and opioid analgesics. Symptoms are reported to be located in the right knee and include knee pain. The patient describes their pain as sharp and aching. Onset of symptoms was sudden beginning immediately after the injury with symptoms now occurring constantly. Current treatment includes knee immobilizer, nonsteroidal anti-inflammatory drugs and opioid analgesics. History of knee arthroscopy on left knee in 95-96. Risks, benefits and expectations were discussed with the patient. Patient understand the risks, benefits and expectations and wishes to proceed with surgery.  PCP: Default, Provider, MD   Discharged Condition: good  Hospital Course:  Patient underwent the above  stated procedure on 11/15/2012. Patient tolerated the procedure well and brought to the recovery room in good condition and subsequently to the floor.  POD #1 BP: 174/93 ; Pulse: 77 ; Temp: 98.7 F (37.1 C) ; Resp: 18  Pt's foley was removed, as well as the hemovac drain removed. IV was changed to a saline lock. Patient reports pain as moderate, pain not well controlled at this time. No other events throughout the night. Ready to be discharged home. Neurovascular intact, dorsiflexion/plantar flexion intact, incision: dressing C/D/I, no cellulitis present and compartment soft.   LABS  Basename    HGB  12.1  HCT  34.8    Discharge Exam: General appearance: alert, cooperative and no distress Extremities: Homans sign is negative, no sign of DVT, no edema, redness or tenderness in the calves or thighs and no ulcers, gangrene or trophic changes  Disposition:    Home-Health Care Svc with follow up in 2 weeks   Follow-up Information   Follow up with [name], MD. Schedule an appointment as soon as possible for a visit in 2 weeks.   Specialty:  Orthopedic Surgery   Contact information:   [address] [place] [address] [phone]       Discharge Orders   Future Orders Complete By Expires   Call MD / Call 911  As directed    Comments:     If you experience chest pain or shortness of breath, CALL 911 and be transported to the hospital emergency room.  If you develope a fever above 101 F, pus (white drainage) or  increased drainage or redness at the wound, or calf pain, call your surgeon's office.   Constipation Prevention  As directed    Comments:     Drink plenty of fluids.  Prune juice may be helpful.  You may use a stool softener, such as Colace (over the counter) 100 mg twice a day.  Use MiraLax (over the counter) for constipation as needed.   Diet - low sodium heart healthy  As directed    Discharge instructions  As directed    Comments:     Maintain  surgical dressing for 10-14 days, then replace with gauze and tape. Keep the area dry and clean until follow up. Follow up in 2 weeks at [facility]. Call with any questions or concerns.   Driving restrictions  As directed    Comments:     No driving for 4-6 weeks   Increase activity slowly as tolerated  As directed    Weight bearing as tolerated  As directed    Comments:     With knee immobilizer in place.        Medication List    STOP taking these medications       diclofenac 75 MG EC tablet  Commonly known as:  VOLTAREN     ibuprofen 200 MG tablet  Commonly known as:  ADVIL,MOTRIN     oxyCODONE-acetaminophen 5-325 MG per tablet  Commonly known as:  PERCOCET/ROXICET      TAKE these medications       aspirin 325 MG EC tablet  Take 1 tablet (325 mg total) by mouth 2 (two) times daily.     celecoxib 200 MG capsule  Commonly known as:  CELEBREX  Take 1 capsule (200 mg total) by mouth 2 (two) times daily.     diphenhydrAMINE 25 MG tablet  Commonly known as:  BENADRYL  Take 25 mg by mouth every 6 (six) hours as needed for itching or allergies.     DSS 100 MG Caps  Take 100 mg by mouth 2 (two) times daily.     ferrous sulfate 325 (65 FE) MG tablet  Take 1 tablet (325 mg total) by mouth 3 (three) times daily after meals.     methocarbamol 500 MG tablet  Commonly known as:  ROBAXIN  Take 1 tablet (500 mg total) by mouth every 6 (six) hours as needed (muscle spasms).     multivitamin with minerals Tabs tablet  Take 1 tablet by mouth daily.     oxyCODONE 5 MG immediate release tablet  Commonly known as:  Oxy IR/ROXICODONE  Take 1-3 tablets (5-15 mg total) by mouth every 4 (four) hours as needed for pain.     polyethylene glycol packet  Commonly known as:  MIRALAX / GLYCOLAX  Take 17 g by mouth 2 (two) times daily.         Signed: Anastasio Auerbach. Roshanna Cimino   PAC  11/17/2012, 6:00 PM

## 2012-11-24 ENCOUNTER — Ambulatory Visit: Payer: No Typology Code available for payment source | Attending: Internal Medicine

## 2013-01-11 ENCOUNTER — Ambulatory Visit: Payer: No Typology Code available for payment source

## 2014-10-18 ENCOUNTER — Emergency Department (HOSPITAL_COMMUNITY)
Admission: EM | Admit: 2014-10-18 | Discharge: 2014-10-18 | Discharge status: Against Medical Advice | Payer: No Typology Code available for payment source | Attending: Emergency Medicine | Admitting: Emergency Medicine

## 2014-10-18 ENCOUNTER — Encounter (HOSPITAL_COMMUNITY): Payer: Self-pay | Admitting: *Deleted

## 2014-10-18 DIAGNOSIS — M545 Low back pain: Secondary | ICD-10-CM | POA: Insufficient documentation

## 2014-10-18 DIAGNOSIS — R112 Nausea with vomiting, unspecified: Secondary | ICD-10-CM | POA: Insufficient documentation

## 2014-10-18 DIAGNOSIS — J45909 Unspecified asthma, uncomplicated: Secondary | ICD-10-CM | POA: Insufficient documentation

## 2014-10-18 DIAGNOSIS — F1010 Alcohol abuse, uncomplicated: Secondary | ICD-10-CM | POA: Insufficient documentation

## 2014-10-18 NOTE — ED Notes (Signed)
Pt told registration that he was tired of waiting and would just come back tomorrow. Pt left after triage.

## 2014-10-18 NOTE — ED Notes (Signed)
Pt complains of nausea/vomiting/diarrhea/shakinessfor the past week. Pt states he has been trying to quit alcohol for the past 2 weeks, but has been unable to control his vomiting unless he has an alcoholic drink. Pt states he is unsure if he needs to help to stop drinking. Pt states his stomach and lower back are hurting from vomiting.

## 2014-10-19 ENCOUNTER — Encounter (HOSPITAL_COMMUNITY): Payer: Self-pay

## 2014-10-19 ENCOUNTER — Inpatient Hospital Stay (HOSPITAL_COMMUNITY)
Admission: EM | Admit: 2014-10-19 | Discharge: 2014-10-21 | DRG: 897 | Disposition: A | Discharge status: Home / Self Care | Payer: No Typology Code available for payment source | Attending: Internal Medicine | Admitting: Internal Medicine

## 2014-10-19 DIAGNOSIS — F10230 Alcohol dependence with withdrawal, uncomplicated: Secondary | ICD-10-CM

## 2014-10-19 DIAGNOSIS — F1090 Alcohol use, unspecified, uncomplicated: Secondary | ICD-10-CM | POA: Diagnosis present

## 2014-10-19 DIAGNOSIS — I10 Essential (primary) hypertension: Secondary | ICD-10-CM | POA: Diagnosis present

## 2014-10-19 DIAGNOSIS — R112 Nausea with vomiting, unspecified: Secondary | ICD-10-CM | POA: Diagnosis present

## 2014-10-19 DIAGNOSIS — F10239 Alcohol dependence with withdrawal, unspecified: Secondary | ICD-10-CM | POA: Diagnosis present

## 2014-10-19 DIAGNOSIS — F1010 Alcohol abuse, uncomplicated: Secondary | ICD-10-CM | POA: Insufficient documentation

## 2014-10-19 DIAGNOSIS — Z6832 Body mass index (BMI) 32.0-32.9, adult: Secondary | ICD-10-CM

## 2014-10-19 DIAGNOSIS — F10939 Alcohol use, unspecified with withdrawal, unspecified: Secondary | ICD-10-CM | POA: Diagnosis present

## 2014-10-19 DIAGNOSIS — E669 Obesity, unspecified: Secondary | ICD-10-CM | POA: Diagnosis present

## 2014-10-19 DIAGNOSIS — R110 Nausea: Secondary | ICD-10-CM

## 2014-10-19 DIAGNOSIS — F1020 Alcohol dependence, uncomplicated: Secondary | ICD-10-CM | POA: Diagnosis present

## 2014-10-19 DIAGNOSIS — K2920 Alcoholic gastritis without bleeding: Secondary | ICD-10-CM | POA: Diagnosis present

## 2014-10-19 DIAGNOSIS — E860 Dehydration: Secondary | ICD-10-CM | POA: Diagnosis present

## 2014-10-19 DIAGNOSIS — J45909 Unspecified asthma, uncomplicated: Secondary | ICD-10-CM | POA: Diagnosis present

## 2014-10-19 LAB — URINALYSIS, ROUTINE W REFLEX MICROSCOPIC
Glucose, UA: NEGATIVE mg/dL
Hgb urine dipstick: NEGATIVE
KETONES UR: NEGATIVE mg/dL
Nitrite: NEGATIVE
PH: 5.5 (ref 5.0–8.0)
Protein, ur: 100 mg/dL — AB
SPECIFIC GRAVITY, URINE: 1.031 — AB (ref 1.005–1.030)
UROBILINOGEN UA: 1 mg/dL (ref 0.0–1.0)

## 2014-10-19 LAB — URINE MICROSCOPIC-ADD ON

## 2014-10-19 LAB — CBC WITH DIFFERENTIAL/PLATELET
BASOS PCT: 1 % (ref 0–1)
Basophils Absolute: 0.1 10*3/uL (ref 0.0–0.1)
Eosinophils Absolute: 0.2 10*3/uL (ref 0.0–0.7)
Eosinophils Relative: 5 % (ref 0–5)
HEMATOCRIT: 43.9 % (ref 39.0–52.0)
HEMOGLOBIN: 15.1 g/dL (ref 13.0–17.0)
LYMPHS ABS: 1.7 10*3/uL (ref 0.7–4.0)
Lymphocytes Relative: 38 % (ref 12–46)
MCH: 31.3 pg (ref 26.0–34.0)
MCHC: 34.4 g/dL (ref 30.0–36.0)
MCV: 91.1 fL (ref 78.0–100.0)
MONO ABS: 0.5 10*3/uL (ref 0.1–1.0)
MONOS PCT: 11 % (ref 3–12)
NEUTROS ABS: 2 10*3/uL (ref 1.7–7.7)
Neutrophils Relative %: 45 % (ref 43–77)
Platelets: 226 10*3/uL (ref 150–400)
RBC: 4.82 MIL/uL (ref 4.22–5.81)
RDW: 13.9 % (ref 11.5–15.5)
WBC: 4.5 10*3/uL (ref 4.0–10.5)

## 2014-10-19 LAB — COMPREHENSIVE METABOLIC PANEL
ALK PHOS: 68 U/L (ref 38–126)
ALT: 47 U/L (ref 17–63)
ANION GAP: 8 (ref 5–15)
AST: 122 U/L — ABNORMAL HIGH (ref 15–41)
Albumin: 3.7 g/dL (ref 3.5–5.0)
BUN: 11 mg/dL (ref 6–20)
CO2: 25 mmol/L (ref 22–32)
Calcium: 9.1 mg/dL (ref 8.9–10.3)
Chloride: 104 mmol/L (ref 101–111)
Creatinine, Ser: 1.02 mg/dL (ref 0.61–1.24)
GFR calc non Af Amer: >60 mL/min (ref >60)
Glucose, Bld: 106 mg/dL — ABNORMAL HIGH (ref 65–99)
Potassium: 4.2 mmol/L (ref 3.5–5.1)
SODIUM: 137 mmol/L (ref 135–145)
Total Bilirubin: 0.9 mg/dL (ref 0.3–1.2)
Total Protein: 7.9 g/dL (ref 6.5–8.1)

## 2014-10-19 LAB — LIPASE, BLOOD: Lipase: 17 U/L — ABNORMAL LOW (ref 22–51)

## 2014-10-19 LAB — ETHANOL: ALCOHOL ETHYL (B): 72 mg/dL — AB (ref <5)

## 2014-10-19 MED ORDER — VITAMIN B-1 100 MG PO TABS: 100.0000 mg | ORAL_TABLET | Freq: Every day | ORAL | Status: DC

## 2014-10-19 MED ORDER — LORAZEPAM 1 MG PO TABS
1.0000 mg | ORAL_TABLET | Freq: Four times a day (QID) | ORAL | Status: DC | PRN
  Administered 2014-10-19 – 2014-10-20 (×3): 1 mg via ORAL
  Filled 2014-10-19 (×3): qty 1

## 2014-10-19 MED ORDER — LORAZEPAM 2 MG/ML IJ SOLN
1.0000 mg | Freq: Four times a day (QID) | INTRAMUSCULAR | Status: DC | PRN
  Administered 2014-10-19 – 2014-10-21 (×4): 1 mg via INTRAVENOUS
  Filled 2014-10-19 (×4): qty 1

## 2014-10-19 MED ORDER — ADULT MULTIVITAMIN W/MINERALS CH
1.0000 | ORAL_TABLET | Freq: Every day | ORAL | Status: DC
  Administered 2014-10-20 – 2014-10-21 (×2): 1 via ORAL
  Filled 2014-10-19 (×2): qty 1

## 2014-10-19 MED ORDER — THIAMINE HCL 100 MG/ML IJ SOLN
100.0000 mg | Freq: Every day | INTRAMUSCULAR | Status: DC
  Administered 2014-10-19: 100 mg via INTRAVENOUS
  Filled 2014-10-19: qty 2

## 2014-10-19 MED ORDER — ADULT MULTIVITAMIN W/MINERALS CH
1.0000 | ORAL_TABLET | Freq: Every day | ORAL | Status: DC
  Administered 2014-10-19: 1 via ORAL
  Filled 2014-10-19: qty 1

## 2014-10-19 MED ORDER — THIAMINE HCL 100 MG/ML IJ SOLN
100.0000 mg | Freq: Every day | INTRAMUSCULAR | Status: DC
  Filled 2014-10-19: qty 1

## 2014-10-19 MED ORDER — ENOXAPARIN SODIUM 40 MG/0.4ML ~~LOC~~ SOLN
40.0000 mg | SUBCUTANEOUS | Status: DC
  Administered 2014-10-19 – 2014-10-20 (×2): 40 mg via SUBCUTANEOUS
  Filled 2014-10-19 (×3): qty 0.4

## 2014-10-19 MED ORDER — LORAZEPAM 1 MG PO TABS: 1.0000 mg | ORAL_TABLET | Freq: Four times a day (QID) | ORAL | Status: DC | PRN

## 2014-10-19 MED ORDER — VITAMIN B-1 100 MG PO TABS
100.0000 mg | ORAL_TABLET | Freq: Every day | ORAL | Status: DC
  Administered 2014-10-20 – 2014-10-21 (×2): 100 mg via ORAL
  Filled 2014-10-19 (×2): qty 1

## 2014-10-19 MED ORDER — FOLIC ACID 1 MG PO TABS
1.0000 mg | ORAL_TABLET | Freq: Every day | ORAL | Status: DC
  Filled 2014-10-19: qty 1

## 2014-10-19 MED ORDER — FOLIC ACID 1 MG PO TABS
1.0000 mg | ORAL_TABLET | Freq: Every day | ORAL | Status: DC
  Administered 2014-10-19: 1 mg via ORAL
  Filled 2014-10-19: qty 1

## 2014-10-19 MED ORDER — HYDRALAZINE HCL 20 MG/ML IJ SOLN
10.0000 mg | Freq: Four times a day (QID) | INTRAMUSCULAR | Status: DC | PRN
  Administered 2014-10-19 – 2014-10-20 (×2): 10 mg via INTRAVENOUS
  Filled 2014-10-19 (×2): qty 1

## 2014-10-19 MED ORDER — FOLIC ACID 1 MG PO TABS
1.0000 mg | ORAL_TABLET | Freq: Every day | ORAL | Status: DC
  Administered 2014-10-20 – 2014-10-21 (×2): 1 mg via ORAL
  Filled 2014-10-19 (×2): qty 1

## 2014-10-19 MED ORDER — VITAMIN B-1 100 MG PO TABS
100.0000 mg | ORAL_TABLET | Freq: Every day | ORAL | Status: DC
  Filled 2014-10-19: qty 1

## 2014-10-19 MED ORDER — SODIUM CHLORIDE 0.9 % IV SOLN
INTRAVENOUS | Status: DC
  Administered 2014-10-19 – 2014-10-21 (×5): via INTRAVENOUS

## 2014-10-19 MED ORDER — THIAMINE HCL 100 MG/ML IJ SOLN
100.0000 mg | Freq: Every day | INTRAMUSCULAR | Status: DC
  Filled 2014-10-19 (×2): qty 1

## 2014-10-19 MED ORDER — HYDRALAZINE HCL 20 MG/ML IJ SOLN
10.0000 mg | Freq: Once | INTRAMUSCULAR | Status: AC
  Administered 2014-10-19: 10 mg via INTRAVENOUS
  Filled 2014-10-19: qty 1

## 2014-10-19 MED ORDER — LORAZEPAM 2 MG/ML IJ SOLN
1.0000 mg | Freq: Four times a day (QID) | INTRAMUSCULAR | Status: DC | PRN
  Administered 2014-10-19: 1 mg via INTRAVENOUS
  Filled 2014-10-19: qty 1

## 2014-10-19 MED ORDER — PROMETHAZINE HCL 25 MG PO TABS
12.5000 mg | ORAL_TABLET | Freq: Four times a day (QID) | ORAL | Status: DC | PRN
  Administered 2014-10-20: 12.5 mg via ORAL
  Filled 2014-10-19: qty 1

## 2014-10-19 MED ORDER — ADULT MULTIVITAMIN W/MINERALS CH
1.0000 | ORAL_TABLET | Freq: Every day | ORAL | Status: DC
  Filled 2014-10-19: qty 1

## 2014-10-19 NOTE — ED Notes (Signed)
Pt attempting to get urine sample at this time.

## 2014-10-19 NOTE — ED Notes (Signed)
Per pt, has hx of drinking alcohol x 10 years.  Drinks daily.  Recently trying to stop drinking.  Started having vomiting and shakes even though stopped drinking less than 24 hours.  Drank again today to stop vomiting with success. Pt also states discomfort with urination with left sided back pain x 1 week.  Slight abdominal pain.

## 2014-10-19 NOTE — ED Notes (Signed)
Pt reports last drink was a beer at 09:30am on 10/19/14

## 2014-10-19 NOTE — ED Provider Notes (Signed)
CSN: [id number]     Arrival date & time 10/19/14  1210 History   First MD Initiated Contact with Patient 10/19/14 1235     Chief Complaint  Patient presents with  . Emesis     (Consider location/radiation/quality/duration/timing/severity/associated sxs/prior Treatment) HPI Patient is a vomiting several times per day for the past 4-5 days. He's vomited between 5 and 10 times today. No hematemesis. He is trying to taper himself off alcohol. He was feeling tremulous today so he drank beer this morning to help with shakes and vomiting with some improvement. He denies nausea at present. Other associated symptoms :He also complains of mild dysuria and mild lower abdominal discomfort and left-sided low back discomfort denies urethral discharge. Denies burning on urination. No fever. No other associated symptoms. No treatment prior to coming here Past Medical History  Diagnosis Date  . Asthma     as child - no problems since  . Rash     back  . Patellar tendon rupture     RT knee  . Recovering alcoholic    Past Surgical History  Procedure Laterality Date  . Achilles tendon surgery    . Knee surgery      1995  . Patellar tendon repair Right 11/15/2012    Procedure: OPEN REPAIR RIGHT PATELLA TENDON ;  Surgeon: [name], MD;  Location: [facility] ORS;  Service: Orthopedics;  Laterality: Right;   Family History  Problem Relation Age of Onset  . Hypertension Other    History  Substance Use Topics  . Smoking status: Never Smoker   . Smokeless tobacco: Not on file  . Alcohol Use: No     Comment: quit in 08/1974    Review of Systems  Gastrointestinal: Positive for vomiting.  Genitourinary: Positive for dysuria and flank pain.  Neurological: Positive for tremors.  All other systems reviewed and are negative.     Allergies  Review of patient's allergies indicates no known allergies.  Home Medications   Prior to Admission medications   Medication Sig Start Date End Date Taking?  Authorizing Provider  celecoxib (CELEBREX) 200 MG capsule Take 1 capsule (200 mg total) by mouth 2 (two) times daily. 11/16/12   Lanney Gins, PA-C  diphenhydrAMINE (BENADRYL) 25 MG tablet Take 25 mg by mouth every 6 (six) hours as needed for itching or allergies.    Historical Provider, MD  docusate sodium 100 MG CAPS Take 100 mg by mouth 2 (two) times daily. 11/16/12   Lanney Gins, PA-C  ferrous sulfate 325 (65 FE) MG tablet Take 1 tablet (325 mg total) by mouth 3 (three) times daily after meals. 11/16/12   Lanney Gins, PA-C  methocarbamol (ROBAXIN) 500 MG tablet Take 1 tablet (500 mg total) by mouth every 6 (six) hours as needed (muscle spasms). 11/16/12   Lanney Gins, PA-C  Multiple Vitamin (MULTIVITAMIN WITH MINERALS) TABS tablet Take 1 tablet by mouth daily.    Historical Provider, MD  oxyCODONE (OXY IR/ROXICODONE) 5 MG immediate release tablet Take 1-3 tablets (5-15 mg total) by mouth every 4 (four) hours as needed for pain. 11/16/12   Lanney Gins, PA-C  polyethylene glycol (MIRALAX / GLYCOLAX) packet Take 17 g by mouth 2 (two) times daily. 11/16/12   Lanney Gins, PA-C   BP 160/101 mmHg  Pulse 83  Temp(Src) 98.3 F (36.8 C) (Oral)  SpO2 99% Physical Exam  Constitutional: He appears well-developed and well-nourished. No distress.  HENT:  Head: Normocephalic and atraumatic.  Eyes: Conjunctivae are normal.  Pupils are equal, round, and reactive to light.  Neck: Neck supple. No tracheal deviation present. No thyromegaly present.  Cardiovascular: Normal rate and regular rhythm.   No murmur heard. Pulmonary/Chest: Effort normal and breath sounds normal.  Abdominal: Soft. Bowel sounds are normal. He exhibits no distension. There is no tenderness.  Genitourinary: Penis normal.  Musculoskeletal: Normal range of motion. He exhibits no edema or tenderness.  Neurological: He is alert. Coordination normal.  Skin: Skin is warm and dry. No rash noted.  Psychiatric: He has a normal mood  and affect.  Nursing note and vitals reviewed.   ED Course  Procedures (including critical care time) Labs Review Labs Reviewed - No data to display  Imaging Review No results found.   EKG Interpretation None     3:05 PM patient appears comfortable. Results for orders placed or performed during the hospital encounter of 10/19/14  Comprehensive metabolic panel  Result Value Ref Range   Sodium 137 135 - 145 mmol/L   Potassium 4.2 3.5 - 5.1 mmol/L   Chloride 104 101 - 111 mmol/L   CO2 25 22 - 32 mmol/L   Glucose, Bld 106 (H) 65 - 99 mg/dL   BUN 11 6 - 20 mg/dL   Creatinine, Ser 1.61 0.61 - 1.24 mg/dL   Calcium 9.1 8.9 - 09.6 mg/dL   Total Protein 7.9 6.5 - 8.1 g/dL   Albumin 3.7 3.5 - 5.0 g/dL   AST 045 (H) 15 - 41 U/L   ALT 47 17 - 63 U/L   Alkaline Phosphatase 68 38 - 126 U/L   Total Bilirubin 0.9 0.3 - 1.2 mg/dL   GFR calc non Af Amer >60 >60 mL/min   GFR calc Af Amer >60 >60 mL/min   Anion gap 8 5 - 15  CBC with Differential/Platelet  Result Value Ref Range   WBC 4.5 4.0 - 10.5 K/uL   RBC 4.82 4.22 - 5.81 MIL/uL   Hemoglobin 15.1 13.0 - 17.0 g/dL   HCT 40.9 81.1 - 91.4 %   MCV 91.1 78.0 - 100.0 fL   MCH 31.3 26.0 - 34.0 pg   MCHC 34.4 30.0 - 36.0 g/dL   RDW 78.2 95.6 - 21.3 %   Platelets 226 150 - 400 K/uL   Neutrophils Relative % 45 43 - 77 %   Neutro Abs 2.0 1.7 - 7.7 K/uL   Lymphocytes Relative 38 12 - 46 %   Lymphs Abs 1.7 0.7 - 4.0 K/uL   Monocytes Relative 11 3 - 12 %   Monocytes Absolute 0.5 0.1 - 1.0 K/uL   Eosinophils Relative 5 0 - 5 %   Eosinophils Absolute 0.2 0.0 - 0.7 K/uL   Basophils Relative 1 0 - 1 %   Basophils Absolute 0.1 0.0 - 0.1 K/uL  Lipase, blood  Result Value Ref Range   Lipase 17 (L) 22 - 51 U/L  Urinalysis, Routine w reflex microscopic (not at [facility])  Result Value Ref Range   Color, Urine ORANGE (A) YELLOW   APPearance CLOUDY (A) CLEAR   Specific Gravity, Urine 1.031 (H) 1.005 - 1.030   pH 5.5 5.0 - 8.0   Glucose, UA  NEGATIVE NEGATIVE mg/dL   Hgb urine dipstick NEGATIVE NEGATIVE   Bilirubin Urine SMALL (A) NEGATIVE   Ketones, ur NEGATIVE NEGATIVE mg/dL   Protein, ur 086 (A) NEGATIVE mg/dL   Urobilinogen, UA 1.0 0.0 - 1.0 mg/dL   Nitrite NEGATIVE NEGATIVE   Leukocytes, UA TRACE (A) NEGATIVE  Ethanol  Result Value Ref Range   Alcohol, Ethyl (B) 72 (H) <5 mg/dL  Urine microscopic-add on  Result Value Ref Range   Squamous Epithelial / LPF RARE RARE   WBC, UA 0-2 <3 WBC/hpf   Bacteria, UA FEW (A) RARE   Casts HYALINE CASTS (A) NEGATIVE   Urine-Other MUCOUS PRESENT    No results found.  MDM  In Light of patient's vomiting,, self-medicating with alcohol I feel that he needs medical detoxification. Spoke with Dr.Dhungel  Plan 23 hours of elation telemetry Diagnosis #1 chronic alcohol abuse #2 nausea and vomiting #3 elevated blood pressure  Final diagnoses:  None        Doug Sou, MD 10/19/14 1511

## 2014-10-19 NOTE — H&P (Signed)
[facility] History and Physical  Francisco Bates ZOX:[id number] DOB: 1965/03/27 DOA: 10/19/2014  Referring physician: Doug Sou PCP: Default, Provider, MD   Chief Complaint: Nausea and vomiting for one week associated with tremors  HPI:  49 year old obese male with chronic alcohol use with history of detox and DTs in the past presented to the ED with 1-2 weeks of off-and-on nausea and vomiting in an attempt to cut down on his drinking. Patient drinks about 4-5, 40 ounces of beer every day. For the past 2 weeks he has been trying to cut down and being absent from drinking but develops shakes in the morning for which she drinks 1 or 2 beers to calm himself down. He also reports having off-and-on vomiting which has been more pronounced for the past 6-7 days where he is vomiting 4-5 times a day and Patient denies headache, dizziness, fever, chills, nausea , vomiting, chest pain, palpitations, SOB, abdominal pain, bowel or urinary symptoms. Denies change in weight or appetite.  In the ED patient had elevated blood pressure of 172/109 mmHg. Vitals were otherwise stable. As per ED physician had mild tremors. Blood will done including CBC and comprehensive metabolic panel was unremarkable for elevated AST of 122. Hospitalists admission requested for observation given ongoing nausea and vomiting and dehydration.  Review of Systems:  Constitutional: Denies fever, chills, diaphoresis, appetite change and fatigue.  HEENT: Denies swallowing hearing symptoms, congestion, difficulty swallowing, neck pain or stiffness Respiratory: Denies SOB, DOE, cough, chest tightness,  and wheezing.   Cardiovascular: Denies chest pain, palpitations and leg swelling.  Gastrointestinal: nausea, vomiting, denies abdominal pain, diarrhea, constipation, blood in stool and abdominal distention.  Genitourinary: Denies dysuria, urgency, frequency, hematuria, flank pain and difficulty urinating.  Endocrine: Denies: hot  or cold intolerance, sweats,  polyuria, polydipsia. Musculoskeletal: Denies myalgias, back pain, joint pain or swelling Skin: Denies pallor, rash and wound.  Neurological: Denies dizziness, seizures, syncope, weakness, light-headedness, numbness and headaches.  Hematological: Denies adenopathy.  Psychiatric/Behavioral: Denies  mood changes, confusion, nervousness, sleep disturbance and agitation   Past Medical History  Diagnosis Date  . Asthma     as child - no problems since  . Rash     back  . Patellar tendon rupture     RT knee  . Recovering alcoholic    Past Surgical History  Procedure Laterality Date  . Achilles tendon surgery    . Knee surgery      1995  . Patellar tendon repair Right 11/15/2012    Procedure: OPEN REPAIR RIGHT PATELLA TENDON ;  Surgeon: [name], MD;  Location: [facility];  Service: Orthopedics;  Laterality: Right;   Social History:  reports that he has never smoked. He does not have any smokeless tobacco history on file. He reports that he does not drink alcohol or use illicit drugs.  No Known Allergies  Family History  Problem Relation Age of Onset  . Hypertension Other     Prior to admission medications Patient not taking any medications at this time.   Physical Exam:  Filed Vitals:   10/19/14 1223 10/19/14 1345 10/19/14 1514  BP: 160/101 143/89 167/98  Pulse: 83 97 87  Temp: 98.3 F (36.8 C)  98.8 F (37.1 C)  TempSrc: Oral  Oral  Resp:   18  SpO2: 99%  98%    Constitutional: Vital signs reviewed.  Middle aged obese male in no acute distress HEENT: no pallor, no icterus, dry oral mucosa, no cervical lymphadenopathy, supple neck  Cardiovascular: RRR, S1 normal, S2 normal, no MRG Chest: CTAB, no wheezes, rales, or rhonchi Abdominal: Soft. Non-tender, non-distended, bowel sounds are normal, Ext: warm, no edema Neurological: Alert and oriented, no tremors  Labs on Admission:  Basic Metabolic Panel:  Recent Labs Lab 10/19/14 1317   NA 137  K 4.2  CL 104  CO2 25  GLUCOSE 106*  BUN 11  CREATININE 1.02  CALCIUM 9.1   Liver Function Tests:  Recent Labs Lab 10/19/14 1317  AST 122*  ALT 47  ALKPHOS 68  BILITOT 0.9  PROT 7.9  ALBUMIN 3.7    Recent Labs Lab 10/19/14 1317  LIPASE 17*   No results for input(s): AMMONIA in the last 168 hours. CBC:  Recent Labs Lab 10/19/14 1317  WBC 4.5  NEUTROABS 2.0  HGB 15.1  HCT 43.9  MCV 91.1  PLT 226   Cardiac Enzymes: No results for input(s): CKTOTAL, CKMB, CKMBINDEX, TROPONINI in the last 168 hours. BNP: Invalid input(s): POCBNP CBG: No results for input(s): GLUCAP in the last 168 hours.  Radiological Exams on Admission: No results found.  EKG: None  Assessment/Plan  Principal Problem:   Nausea and vomiting Possibly related to alcoholic gastritis. Admit under observation on MedSurg. -Start on clears. IV hydration with normal saline. Supportive care with antiemetics (when necessary Zofran and Phenergan) and PPI. Replenish lites as needed. Check magnesium level.  Active Problems: Alcohol abuse with mild withdrawal Patient not tremulous on my exam. Will monitor on CIWA. Add thiamine, folate and multivitamin. Has history of going into DTs in the past. Social worker consulted for detox. (Patient has received inpatient detox at behavioral health in 2013. May only need outpatient referral programs at this time)   Elevated blood pressure Will monitor on when necessary hydralazine. Likely in the setting of mild withdrawal symptoms and dehydration.  Diet: Clear liquid  DVT prophylaxis: sq lovenox   Code Status: full code Family Communication: None at bedside Disposition Plan: Abdominal lesion. Possibly home in 1-2 days with outpatient referral to etoh cessation program  Eddie North [facility] Pager [phone]  Total time spent on admission 50 minutes  If 7PM-7AM, please contact night-coverage [email or web address] Password  [email or web address] 10/19/2014, 3:30 PM

## 2014-10-19 NOTE — Progress Notes (Signed)
Pt's BP still high after  IV hydralazine. CIWA score increased from 6 to 9 a couple hours after administering  ativan. Notified MD.

## 2014-10-20 DIAGNOSIS — R030 Elevated blood-pressure reading, without diagnosis of hypertension: Secondary | ICD-10-CM

## 2014-10-20 DIAGNOSIS — R112 Nausea with vomiting, unspecified: Secondary | ICD-10-CM

## 2014-10-20 MED ORDER — LORAZEPAM 2 MG/ML IJ SOLN
1.0000 mg | Freq: Once | INTRAMUSCULAR | Status: AC
  Administered 2014-10-20: 1 mg via INTRAVENOUS
  Filled 2014-10-20: qty 1

## 2014-10-20 MED ORDER — HYDRALAZINE HCL 20 MG/ML IJ SOLN
10.0000 mg | Freq: Once | INTRAMUSCULAR | Status: AC
  Administered 2014-10-20: 10 mg via INTRAVENOUS
  Filled 2014-10-20: qty 1

## 2014-10-20 MED ORDER — IBUPROFEN 200 MG PO TABS
400.0000 mg | ORAL_TABLET | Freq: Once | ORAL | Status: AC
  Administered 2014-10-20: 400 mg via ORAL
  Filled 2014-10-20: qty 2

## 2014-10-20 MED ORDER — LORAZEPAM BOLUS VIA INFUSION: 1.0000 mg | Freq: Once | INTRAVENOUS | Status: DC

## 2014-10-20 NOTE — Clinical Social Work Note (Signed)
Clinical Social Work Assessment  Patient Details  Name: Francisco Bates MRN: [id number] Date of Birth: 1965-09-30  Date of referral:  10/20/14               Reason for consult:  Substance Use/ETOH Abuse                Permission sought to share information with:    Permission granted to share information::  No  Name::        Agency::     Relationship::     Contact Information:     Housing/Transportation Living arrangements for the past 2 months:  Single Family Home Source of Information:  Patient Patient Interpreter Needed:  None Criminal Activity/Legal Involvement Pertinent to Current Situation/Hospitalization:  No - Comment as needed Significant Relationships:  Significant Other Lives with:  Self Do you feel safe going back to the place where you live?  Yes Need for family participation in patient care:  No (Coment)  Care giving concerns:  Patient plans to return back home at DC.   Social Worker assessment / plan:  CSW received referral in order to complete psychosocial assessment. CSW reviewed chart and met with patient at bedside. CSW introduced myself and explained role.  Patient reports he lives at home alone. 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 Patient reports he has been at home lately and that he started drinking more heavily and realized that his physical health was deteriorating. Patient reports he started drinking around the age of 29 and started drinking more often throughout the years. Patient began drinking on a daily basis and decided that he needed to reduce his consumption. Patient attempted to detox at home but reports DTs were too much to handle so he came to the hospital. Patient reports that he has decided that once he DC from the hospital he wants to stay sober so that he does not have to withdraw again.  Patient completed SBIRT and aware that treatment would be helpful to stay sober. Patient has a  Product manager that attends [place] meetings that he has been speaking with. CSW encouraged patient to continue to follow up with mentor but suggested that patient attend AA meetings as well. Patient reports that he does not like group settings and feels that individual counseling is more effective for him. CSW provided referral to [facility] and explained they could accept patient on sliding scale fee.  Patient agreeable to plans and has information to schedule appointment. CSW will continue to follow.   Employment status:  Unemployed Forensic scientist:  [employer] PT Recommendations:  Not assessed at this time Information / Referral to community resources:  SBIRT, Outpatient Substance Abuse Treatment Options  Patient/Family's Response to care:  Patient engaged during assessment and thanked CSW for information.  Patient/Family's Understanding of and Emotional Response to Diagnosis, Current Treatment, and Prognosis:  Patient aware to SA and agreeable to discuss treatment and options at DC. Patient is hopeful that after being sober for extended period of time that he will start feeling better. Patient agreeable to individual treatment at DC.  Emotional Assessment Appearance:  Appears stated age Attitude/Demeanor/Rapport:  Other (Cooperative) Affect (typically observed):  Pleasant Orientation:  Oriented to Self, Oriented to Place, Oriented to  Time, Oriented to Situation Alcohol / Substance use:  Alcohol Use Psych involvement (Current and /or in the community):  No (Comment)  Discharge Needs  Concerns to be addressed:  No discharge needs identified Readmission within the last 30 days:  No Current discharge risk:  None Barriers to Discharge:  No Barriers Identified   Boone Master, [place] 10/20/2014, 2:19 PM [phone]

## 2014-10-20 NOTE — Progress Notes (Signed)
[facility] PROGRESS NOTE  Francisco Bates WUJ:[id number] DOB: 03/30/1965 DOA: 10/19/2014 PCP: Default, Provider, MD  Assessment/Plan: Alcohol abuse with withdrawal [name]. Continue to monitor on CIWA (score of 7 this morning). Continue thiamine, folate and multivitamin. History of wing into DTs in the past. Social worker consulted.  Elevated blood pressure Secondary to withdrawal symptoms. Not on any blood pressure medications at outpatient. Continue Ativan and when necessary hydralazine.  Nausea and vomiting Possibly related to alcoholic gastritis. No further vomiting but still feels nauseous. Continue IV hydration, supportive care with antiemetics and PPI. Continue clears for today. Replenish lytes as needed.  DVT prophylaxis: Subcutaneous Lovenox Diet: Clears  Code Status: Full code Family Communication: None at bedside Disposition Plan: Home possibly in 24-48 hours if no further withdrawal symptoms   Consultants:  None  Procedures:  None  Antibiotics: None   HPI/Subjective: Patient seen and examined. He reports nausea no vomiting. Has mild tremulousness. Denies headache, palpitations, shortness of breath or diaphoresis.  Objective: Filed Vitals:   10/20/14 0543  BP: 176/92  Pulse: 96  Temp: 98.3 F (36.8 C)  Resp: 19    Intake/Output Summary (Last 24 hours) at 10/20/14 1158 Last data filed at 10/20/14 0629  Gross per 24 hour  Intake    360 ml  Output    550 ml  Net   -190 ml   Filed Weights   10/19/14 1600  Weight: 104.327 kg (230 lb)    Exam:   General:  Middle aged obese male in no acute distress   HEENT: No pallor, dry oral mucosa, supple neck  Chest: Clear to auscultation bilaterally  CVS: Normal S1 and S2, no murmurs  GI: Soft, nondistended, nontender neck   Musculoskeletal: Warm, no edema   CNS: Alert and oriented, mild tremors  Data Reviewed: Basic Metabolic Panel:  Recent  Labs Lab 10/19/14 1317  NA 137  K 4.2  CL 104  CO2 25  GLUCOSE 106*  BUN 11  CREATININE 1.02  CALCIUM 9.1   Liver Function Tests:  Recent Labs Lab 10/19/14 1317  AST 122*  ALT 47  ALKPHOS 68  BILITOT 0.9  PROT 7.9  ALBUMIN 3.7    Recent Labs Lab 10/19/14 1317  LIPASE 17*   No results for input(s): AMMONIA in the last 168 hours. CBC:  Recent Labs Lab 10/19/14 1317  WBC 4.5  NEUTROABS 2.0  HGB 15.1  HCT 43.9  MCV 91.1  PLT 226   Cardiac Enzymes: No results for input(s): CKTOTAL, CKMB, CKMBINDEX, TROPONINI in the last 168 hours. BNP (last 3 results) No results for input(s): BNP in the last 8760 hours.  ProBNP (last 3 results) No results for input(s): PROBNP in the last 8760 hours.  CBG: No results for input(s): GLUCAP in the last 168 hours.  No results found for this or any previous visit (from the past 240 hour(s)).   Studies: No results found.  Scheduled Meds: . enoxaparin (LOVENOX) injection  40 mg Subcutaneous Q24H  . folic acid  1 mg Oral Daily  . multivitamin with minerals  1 tablet Oral Daily  . thiamine  100 mg Oral Daily   Or  . thiamine  100 mg Intravenous Daily   Continuous Infusions: . sodium chloride 125 mL/hr at 10/20/14 0714      Time spent: 25 minutes    Francisco Bates  [facility] Pager [phone]. If 7PM-7AM, please contact night-coverage at [email or web address], password [email or web address] 10/20/2014, 11:58 AM

## 2014-10-21 DIAGNOSIS — F1010 Alcohol abuse, uncomplicated: Secondary | ICD-10-CM

## 2014-10-21 MED ORDER — OMEPRAZOLE 40 MG PO CPDR: 40.0000 mg | DELAYED_RELEASE_CAPSULE | Freq: Every day | ORAL | Status: DC

## 2014-10-21 MED ORDER — ADULT MULTIVITAMIN W/MINERALS CH: 1.0000 | ORAL_TABLET | Freq: Every day | ORAL | Status: DC

## 2014-10-21 MED ORDER — CHLORDIAZEPOXIDE HCL 5 MG PO CAPS: ORAL_CAPSULE | ORAL | Status: DC

## 2014-10-21 MED ORDER — PROMETHAZINE HCL 12.5 MG PO TABS: 12.5000 mg | ORAL_TABLET | Freq: Four times a day (QID) | ORAL | Status: DC | PRN

## 2014-10-21 NOTE — Discharge Summary (Signed)
Physician Discharge Summary  LANORRIS KALISZ ZOX:[id number] DOB: 11-21-1965 DOA: 10/19/2014  PCP: Default, Provider, MD  Admit date: 10/19/2014 Discharge date: 10/21/2014  Time spent: 30 minutes  Recommendations for Outpatient Follow-up:  1. Discharge home with outpatient follow-up at [facility] alcohol rehabilitation counseling.  Discharge Diagnoses:  Principal problem Alcohol withdrawal symptoms  Active Problems:   Alcoholic gastritis   Nausea and vomiting in adult   Alcohol abuse   Obesity  Discharge Condition: Fair  Diet recommendation: Regular  Filed Weights   10/19/14 1600  Weight: 104.327 kg (230 lb)    History of present illness:  49 year old obese male with chronic alcohol use with history of detox and DTs in the past presented to the ED with 1-2 weeks of off-and-on nausea and vomiting in an attempt to cut down on his drinking. Patient drinks about 4-5, 40 ounces of beer every day. For the past 2 weeks he has been trying to cut down and being absent from drinking but develops shakes in the morning for which she drinks 1 or 2 beers to calm himself down. He also reports having off-and-on vomiting which has been more pronounced for the past 6-7 days where he is vomiting 4-5 times a day . Patient denies headache, dizziness, fever, chills, nausea , vomiting, chest pain, palpitations, SOB, abdominal pain, bowel or urinary symptoms. Denies change in weight or appetite.  In the ED patient had elevated blood pressure of 172/109 mmHg. Vitals were otherwise stable. As per ED physician had mild tremors. Blood work done including CBC and comprehensive metabolic panel was unremarkable except for elevated AST of 122. Hospitalists admission requested for observation given ongoing nausea and vomiting and dehydration.  Hospital Course:  Alcohol abuse with withdrawal symptoms Patient hypertensive with minimal tremulousness. placed on when necessary Ativan,  thiamine, folate and multivitamin. No signs and symptoms of DTs. Tolerating advanced diet. CIWA score of 3 this morning). Continue thiamine, folate and multivitamin. History of DTs in the past. Social worker consulted and has provided with outpatient resources for follow-up.. -Will discharge on a short  Librium taper. phenergan prn for nausea. Advanced diet to regular.  Patient is clinically stable for discharge home .  Elevated blood pressure Secondary to withdrawal symptoms. Not on any blood pressure medications at outpatient. Continue Ativan and when necessary hydralazine.  Nausea and vomiting Possibly related to alcoholic gastritis. No further symptoms. Advanced diet. Will prescribe a 4 week course of Prilosec.   Code Status: Full code Family Communication: None at bedside Disposition Plan: Home    Discharge Exam: Filed Vitals:   10/21/14 0514  BP: 148/83  Pulse: 89  Temp: 98 F (36.7 C)  Resp: 18    General: Middle aged obese male in no acute distress HEENT: Moist oral mucosa Chest: Clear to auscultation bilaterally CVS: Normal S1 and S2, no murmurs GI:, Nondistended, nontender, bowel sounds present Musculoskeletal: Warm, no edema  Discharge Instructions    Current Discharge Medication List    START taking these medications   Details  chlordiazePOXIDE (LIBRIUM) 5 MG capsule Please dispense 18 pills - Take 1 pill three times a day for 3 days, then Take 1 pill two times a day for 3 days, then Take 1 pill once a day for 3 days and stop. Qty: 18 capsule, Refills: 0    omeprazole (PRILOSEC) 40 MG capsule Take 1 capsule (40 mg total) by mouth daily. Qty: 30 capsule, Refills: 0    promethazine (PHENERGAN) 12.5  MG tablet Take 1 tablet (12.5 mg total) by mouth every 6 (six) hours as needed for nausea or vomiting. Qty: 30 tablet, Refills: 0      CONTINUE these medications which have CHANGED   Details  Multiple Vitamin (MULTIVITAMIN WITH MINERALS) TABS tablet Take 1  tablet by mouth daily. Qty: 30 tablet, Refills: 0      STOP taking these medications     acetaminophen (TYLENOL ARTHRITIS PAIN) 650 MG CR tablet      diphenhydrAMINE (BENADRYL) 25 MG tablet      celecoxib (CELEBREX) 200 MG capsule      docusate sodium 100 MG CAPS      ferrous sulfate 325 (65 FE) MG tablet      methocarbamol (ROBAXIN) 500 MG tablet      oxyCODONE (OXY IR/ROXICODONE) 5 MG immediate release tablet      polyethylene glycol (MIRALAX / GLYCOLAX) packet        No Known Allergies Follow-up Information    Call [facility].   Specialty:  Professional Counselor   Why:  To schedule an appointment for substance abuse counseling   Contact information:   [address] [place] [address] [phone]        The results of significant diagnostics from this hospitalization (including imaging, microbiology, ancillary and laboratory) are listed below for reference.    Significant Diagnostic Studies: No results found.  Microbiology: No results found for this or any previous visit (from the past 240 hour(s)).   Labs: Basic Metabolic Panel:  Recent Labs Lab 10/19/14 1317  NA 137  K 4.2  CL 104  CO2 25  GLUCOSE 106*  BUN 11  CREATININE 1.02  CALCIUM 9.1   Liver Function Tests:  Recent Labs Lab 10/19/14 1317  AST 122*  ALT 47  ALKPHOS 68  BILITOT 0.9  PROT 7.9  ALBUMIN 3.7    Recent Labs Lab 10/19/14 1317  LIPASE 17*   No results for input(s): AMMONIA in the last 168 hours. CBC:  Recent Labs Lab 10/19/14 1317  WBC 4.5  NEUTROABS 2.0  HGB 15.1  HCT 43.9  MCV 91.1  PLT 226   Cardiac Enzymes: No results for input(s): CKTOTAL, CKMB, CKMBINDEX, TROPONINI in the last 168 hours. BNP: BNP (last 3 results) No results for input(s): BNP in the last 8760 hours.  ProBNP (last 3 results) No results for input(s): PROBNP in the last 8760 hours.  CBG: No results for input(s): GLUCAP in the last 168  hours.     SignedEddie North  [facility] 10/21/2014, 10:48 AM

## 2014-10-21 NOTE — Discharge Instructions (Addendum)
Alcohol Use Disorder Alcohol use disorder is a mental disorder. It is not a one-time incident of heavy drinking. Alcohol use disorder is the excessive and uncontrollable use of alcohol over time that leads to problems with functioning in one or more areas of daily living. People with this disorder risk harming themselves and others when they drink to excess. Alcohol use disorder also can cause other mental disorders, such as mood and anxiety disorders, and serious physical problems. People with alcohol use disorder often misuse other drugs.  Alcohol use disorder is common and widespread. Some people with this disorder drink alcohol to cope with or escape from negative life events. Others drink to relieve chronic pain or symptoms of mental illness. People with a family history of alcohol use disorder are at higher risk of losing control and using alcohol to excess.  SYMPTOMS  Signs and symptoms of alcohol use disorder may include the following:   Consumption ofalcohol inlarger amounts or over a longer period of time than intended.  Multiple unsuccessful attempts to cutdown or control alcohol use.   A great deal of time spent obtaining alcohol, using alcohol, or recovering from the effects of alcohol (hangover).  A strong desire or urge to use alcohol (cravings).   Continued use of alcohol despite problems at work, school, or home because of alcohol use.   Continued use of alcohol despite problems in relationships because of alcohol use.  Continued use of alcohol in situations when it is physically hazardous, such as driving a car.  Continued use of alcohol despite awareness of a physical or psychological problem that is likely related to alcohol use. Physical problems related to alcohol use can involve the brain, heart, liver, stomach, and intestines. Psychological problems related to alcohol use include intoxication, depression, anxiety, psychosis, delirium, and dementia.   The need for  increased amounts of alcohol to achieve the same desired effect, or a decreased effect from the consumption of the same amount of alcohol (tolerance).  Withdrawal symptoms upon reducing or stopping alcohol use, or alcohol use to reduce or avoid withdrawal symptoms. Withdrawal symptoms include:  Racing heart.  Hand tremor.  Difficulty sleeping.  Nausea.  Vomiting.  Hallucinations.  Restlessness.  Seizures. DIAGNOSIS Alcohol use disorder is diagnosed through an assessment by your health care provider. Your health care provider may start by asking three or four questions to screen for excessive or problematic alcohol use. To confirm a diagnosis of alcohol use disorder, at least two symptoms must be present within a 12-month period. The severity of alcohol use disorder depends on the number of symptoms:  Mild--two or three.  Moderate--four or five.  Severe--six or more. Your health care provider may perform a physical exam or use results from lab tests to see if you have physical problems resulting from alcohol use. Your health care provider may refer you to a mental health professional for evaluation. TREATMENT  Some people with alcohol use disorder are able to reduce their alcohol use to low-risk levels. Some people with alcohol use disorder need to quit drinking alcohol. When necessary, mental health professionals with specialized training in substance use treatment can help. Your health care provider can help you decide how severe your alcohol use disorder is and what type of treatment you need. The following forms of treatment are available:   Detoxification. Detoxification involves the use of prescription medicines to prevent alcohol withdrawal symptoms in the first week after quitting. This is important for people with a history of symptoms   of withdrawal and for heavy drinkers who are likely to have withdrawal symptoms. Alcohol withdrawal can be dangerous and, in severe cases, cause  death. Detoxification is usually provided in a hospital or in-patient substance use treatment facility.  Counseling or talk therapy. Talk therapy is provided by substance use treatment counselors. It addresses the reasons people use alcohol and ways to keep them from drinking again. The goals of talk therapy are to help people with alcohol use disorder find healthy activities and ways to cope with life stress, to identify and avoid triggers for alcohol use, and to handle cravings, which can cause relapse.  Medicines.Different medicines can help treat alcohol use disorder through the following actions:  Decrease alcohol cravings.  Decrease the positive reward response felt from alcohol use.  Produce an uncomfortable physical reaction when alcohol is used (aversion therapy).  Support groups. Support groups are run by people who have quit drinking. They provide emotional support, advice, and guidance. These forms of treatment are often combined. Some people with alcohol use disorder benefit from intensive combination treatment provided by specialized substance use treatment centers. Both inpatient and outpatient treatment programs are available. Document Released: 04/17/2004 Document Revised: 07/25/2013 Document Reviewed: 06/17/2012 Exit[employer] Patient Information 2015 [employer]. This information is not intended to replace advice given to you by your health care provider. Make sure you discuss any questions you have with your health care provider. Alcohol and Nutrition Nutrition serves two purposes. It provides energy. It also maintains body structure and function. Food supplies energy. It also provides the building blocks needed to replace worn or damaged cells. Alcoholics often eat poorly. This limits their supply of essential nutrients. This affects energy supply and structure maintenance. Alcohol also affects the body's nutrients in:  Digestion.  Storage.  Using and getting rid of waste  products. IMPAIRMENT OF NUTRIENT DIGESTION AND UTILIZATION   Once ingested, food must be broken down into small components (digested). Then it is available for energy. It helps maintain body structure and function. Digestion begins in the mouth. It continues in the stomach and intestines, with help from the pancreas. The nutrients from digested food are absorbed from the intestines into the blood. Then they are carried to the liver. The liver prepares nutrients for:  Immediate use.  Storage and future use.  Alcohol inhibits the breakdown of nutrients into usable molecules.  It decreases secretion of digestive enzymes from the pancreas.  Alcohol impairs nutrient absorption by damaging the cells lining the stomach and intestines.  It also interferes with moving some nutrients into the blood.  In addition, nutritional deficiencies themselves may lead to further absorption problems.  For example, folate deficiency changes the cells that line the small intestine. This impairs how water is absorbed. It also affects absorbed nutrients. These include glucose, sodium, and additional folate.  Even if nutrients are digested and absorbed, alcohol can prevent them from being fully used. It changes their transport, storage, and excretion. Impaired utilization of nutrients by alcoholics is indicated by:  Decreased liver stores of vitamins, such as vitamin A.  Increased excretion of nutrients such as fat. ALCOHOL AND ENERGY SUPPLY   Three basic nutritional components found in food are:  Carbohydrates.  Proteins.  Fats.  These are used as energy. Some alcoholics take in as much as 50% of their total daily calories from alcohol. They often neglect important foods.  Even when enough food is eaten, alcohol can impair the ways the body controls blood sugar (glucose) levels. It may   either increase or decrease blood sugar.  In non-diabetic alcoholics, increased blood sugar (hyperglycemia) is caused  by poor insulin secretion. It is usually temporary.  Decreased blood sugar (hypoglycemia) can cause serious injury even if this condition is short-lived. Low blood sugar can happen when a fasting or malnourished person drinks alcohol. When there is no food to supply energy, stored sugar is used up. The products of alcohol inhibit forming glucose from other compounds such as amino acids. As a result, alcohol causes the brain and other body tissue to lack glucose. It is needed for energy and function.  Alcohol is an energy source. But how the body processes and uses the energy from alcohol is complex. Also, when alcohol is substituted for carbohydrates, subjects tend to lose weight. This indicates that they get less energy from alcohol than from food. ALCOHOL - MAINTAINING CELL STRUCTURE AND FUNCTION  Structure Cells are made mostly of protein. So an adequate protein diet is important for maintaining cell structure. This is especially true if cells are being damaged. Research indicates that alcohol affects protein nutrition by causing impaired:  Digestion of proteins to amino acids.  Processing of amino acids by the small intestine and liver.  Synthesis of proteins from amino acids.  Protein secretion by the liver. Function Nutrients are essential for the body to function well. They provide the tools that the body needs to work well:   Proteins.  Vitamins.  Minerals. Alcohol can disrupt body function. It may cause nutrient deficiencies. And it may interfere with the way nutrients are processed. Vitamins  Vitamins are essential to maintain growth and normal metabolism. They regulate many of the body`s processes. Chronic heavy drinking causes deficiencies in many vitamins. This is caused by eating less. And, in some cases, vitamins may be poorly absorbed. For example, alcohol inhibits fat absorption. It impairs how the vitamins A, E, and D are normally absorbed along with dietary fats. Not  enough vitamin A may cause night blindness. Not enough vitamin D may cause softening of the bones.  Some alcoholics lack vitamins A, C, D, E, K, and the B vitamins. These are all involved in wound healing and cell maintenance. In particular, because vitamin K is necessary for blood clotting, lacking that vitamin can cause delayed clotting. The result is excess bleeding. Lacking other vitamins involved in brain function may cause severe neurological damage. Minerals Deficiencies of minerals such as calcium, magnesium, iron, and zinc are common in alcoholics. The alcohol itself does not seem to affect how these minerals are absorbed. Rather, they seem to occur secondary to other alcohol-related problems, such as:  Less calcium absorbed.  Not enough magnesium.  More urinary excretion.  Vomiting.  Diarrhea.  Not enough iron due to gastrointestinal bleeding.  Not enough zinc or losses related to other nutrient deficiencies.  Mineral deficiencies can cause a variety of medical consequences. These range from calcium-related bone disease to zinc-related night blindness and skin lesions. ALCOHOL, MALNUTRITION, AND MEDICAL COMPLICATIONS  Liver Disease   Alcoholic liver damage is caused primarily by alcohol itself. But poor nutrition may increase the risk of alcohol-related liver damage. For example, nutrients normally found in the liver are known to be affected by drinking alcohol. These include carotenoids, which are the major sources of vitamin A, and vitamin E compounds. Decreases in such nutrients may play some role in alcohol-related liver damage. Pancreatitis  Research suggests that malnutrition may increase the risk of developing alcoholic pancreatitis. Research suggests that a diet lacking in   protein may increase alcohol's damaging effect on the pancreas. Brain  Nutritional deficiencies may have severe effects on brain function. These may be permanent. Specifically, thiamine deficiencies  are often seen in alcoholics. They can cause severe neurological problems. These include:  Impaired movement.  Memory loss seen in Wernicke-Korsakoff syndrome. Pregnancy  Alcohol has toxic effects on fetal development. It causes alcohol-related birth defects. They include fetal alcohol syndrome. Alcohol itself is toxic to the fetus. Also, the nutritional deficiency can affect how the fetus develops. That may compound the risk of developmental damage.  Nutritional needs during pregnancy are 10% to 30% greater than normal. Food intake can increase by as much as 140% to cover the needs of both mother and fetus. An alcoholic mother`s nutritional problems may adversely affect the nutrition of the fetus. And alcohol itself can also restrict nutrition flow to the fetus. NUTRITIONAL STATUS OF ALCOHOLICS  Techniques for assessing nutritional status include:  Taking body measurements to estimate fat reserves. They include:  Weight.  Height.  Mass.  Skin fold thickness.  Performing blood analysis to provide measurements of circulating:  Proteins.  Vitamins.  Minerals.  These techniques tend to be imprecise. For many nutrients, there is no clear "cut-off" point that would allow an accurate definition of deficiency. So assessing the nutritional status of alcoholics is limited by these techniques. Dietary status may provide information about the risk of developing nutritional problems. Dietary status is assessed by:  Taking patients' dietary histories.  Evaluating the amount and types of food they are eating.  It is difficult to determine what exact amount of alcohol begins to have damaging effects on nutrition. In general, moderate drinkers have 2 drinks or less per day. They seem to be at little risk for nutritional problems. Various medical disorders begin to appear at greater levels.  Research indicates that the majority of even the heaviest drinkers have few obvious nutritional  deficiencies. Many alcoholics who are hospitalized for medical complications of their disease do have severe malnutrition. Alcoholics tend to eat poorly. Often they eat less than the amounts of food necessary to provide enough:  Carbohydrates.  Protein.  Fat.  Vitamins A and C.  B vitamins.  Minerals like calcium and iron. Of major concern is alcohol's effect on digesting food and use of nutrients. It may shift a mildly malnourished person toward severe malnutrition. Document Released: 01/02/2005 Document Revised: 06/02/2011 Document Reviewed: 06/18/2005 Exit[employer] Patient Information 2015 [employer]. This information is not intended to replace advice given to you by your health care provider. Make sure you discuss any questions you have with your health care provider.  

## 2014-10-21 NOTE — Plan of Care (Signed)
Problem: Discharge Progression Outcomes Goal: Other Discharge Outcomes/Goals Outcome: Completed/Met Date Met:  10/21/14 Medication teaching done

## 2017-03-16 ENCOUNTER — Encounter (HOSPITAL_BASED_OUTPATIENT_CLINIC_OR_DEPARTMENT_OTHER): Payer: Self-pay

## 2017-03-27 ENCOUNTER — Ambulatory Visit (HOSPITAL_BASED_OUTPATIENT_CLINIC_OR_DEPARTMENT_OTHER): Payer: No Typology Code available for payment source

## 2017-03-27 ENCOUNTER — Ambulatory Visit: Payer: No Typology Code available for payment source | Attending: Medical | Admitting: Medical

## 2017-03-27 ENCOUNTER — Encounter (HOSPITAL_BASED_OUTPATIENT_CLINIC_OR_DEPARTMENT_OTHER): Payer: Self-pay | Admitting: Medical

## 2017-03-27 VITALS — BP 164/102 | HR 80 | Ht 70.5 in | Wt 249.0 lb

## 2017-03-27 DIAGNOSIS — M1710 Unilateral primary osteoarthritis, unspecified knee: Secondary | ICD-10-CM | POA: Insufficient documentation

## 2017-03-27 DIAGNOSIS — M23300 Other meniscus derangements, unspecified lateral meniscus, right knee: Secondary | ICD-10-CM | POA: Insufficient documentation

## 2017-03-27 DIAGNOSIS — Z6835 Body mass index (BMI) 35.0-35.9, adult: Secondary | ICD-10-CM

## 2017-03-27 DIAGNOSIS — M23203 Derangement of unspecified medial meniscus due to old tear or injury, right knee: Secondary | ICD-10-CM | POA: Insufficient documentation

## 2017-03-27 DIAGNOSIS — M25561 Pain in right knee: Secondary | ICD-10-CM

## 2017-03-27 MED ORDER — TRIAMCINOLONE ACETONIDE 40 MG/ML IJ SUSP
40.0000 mg | Freq: Once | INTRAMUSCULAR | Status: AC
Start: 2017-03-27 — End: 2017-03-27
  Administered 2017-03-27: 40 mg via INTRA_ARTICULAR

## 2017-03-27 MED ORDER — INDOMETHACIN 50 MG OR CAPS: 50.0000 mg | ORAL_CAPSULE | Freq: Three times a day (TID) | ORAL | 0 refills | Status: AC

## 2017-03-27 NOTE — Progress Notes (Signed)
Bill Moore is a 52 year old year old male who comes for evaluation of right knee pain, instability, and weakness.  Very significant for patellar tendon repair in 2014.  After surgery he was unable to do physical therapy because of lack of insurance.  He never had any pain or issues with the knee after surgery, however felt like it wasn't as strong as it should be.  Then in November about 2 months ago when he was lifting a garbage can he felt a twinge in the knee, then later that day when he stepped off a curb the knee buckled back.  He woke up with a swollen knee the next day and since then has had popping, instability and pain.  When he has pain it is primarily located medial.  The activities he has difficulty with include sleeping, stairs, kneeling, and getting off the ground.     Prior treatments for this specific issue have included ice, elevation, naproxen, and neoprene brace.  He also states that he has been gaining weight.    PAST MEDICAL HISTORY   has no past medical history on file.    PAST SURGICAL HISTORY   has a past surgical history that includes UNLISTED PROCEDURE FEMUR/KNEE (Right, 2014).    ALLERGIES  Review of patient's allergies indicates:  No Known Allergies    MEDICATIONS  has a current medication list which includes the following prescription(s): indomethacin and naproxen.    FAMILY HISTORY  family history is not on file.    SOCIAL HISTORY   reports that he has never smoked. He has never used smokeless tobacco. He reports that he drinks about 0.6 oz of alcohol per week . He reports that he does not use drugs.    REVIEW OF SYMPTOMS  A complete review of systems was performed at the clinic visit.  Positive for anxiety.    PHYSICAL EXAM  Vital signs: BP (!) 164/102    Pulse 80    Ht 5' 10.5" (1.791 m)    Wt (!) 249 lb (112.9 kg)    SpO2 99%    BMI 35.22 kg/m   General: Patient in no acute distress. Is alert and appropriate. Answers questions and follows instructions well    Well healed  prior incision, are no deformities, normal muscle tone and bulk. Mild to moderate effusion present. The legs are in normal alignment. Range of motion is from 0-120 without pain at the extremes.  There is no ligamentous laxity to varus, valgus, rotatory, anterior and posterior stress. There is no posterior sag. There is mild joint line tenderness medially. There is mild joint line tenderness laterally. The quadriceps mechanism is intact and can maintain a straight leg raise against resistance without pain. No tenderness to palpation at the patellar tendon. There is no tenderness over the medial retinaculum.  There is crepitus in the patellofemoral joint. There is pain but no apprehension with manipulation of the patella. No motor or sensory deficits distally in the foot or ankle.  Easily palpable pedal pulses.  The uninvolved knee has no lesions of the skin, no deformity, no swelling, and has full range of motion and strength.    RADIOGRAPHS  Partial thickness intrasubstance patellar tendon tear, patellar alto, medial and lateral meniscus tears with moderate to severe patellofemoral and lateral compartment cartilage loss.  Thickening and heterogeneity of the medial, lateral and anterior patellar retinaculum consistent with prior surgery.    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  We talked about his MRI imaging which shows partial patellar tendon tear, and the appearance of the tendon may be post surgical changes or from his misstep, and based on his physical exam today not dangerous. 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 We talked about the anatomy and treatment options moving forward, and we ultimately decided to proceed with a referral for formal physical therapy focused on dedicated strengthening to help stabilize the knee, and to help with his pain we provided a one-time cortisone injection, anti-inflammatories as needed, and lastly at his request hinge knee brace.  We will plan on seeing him back for follow-up in 6-8 weeks to make sure the knee is feeling and functioning better.  If he continues to have problem at that time we can further discuss an arthroscopic cleanup surgery, however hopefully that is unnecessary.    The risks and benefits of the procedure were discussed.  Potential benefits include decreased pain, reduced inflammation, and improved function.  The risks include the small risk of bleeding, a reaction to the medicine, allergy, infection, transient spike in blood glucose levels, skin atrophy or hypopigmentation.  The patient was given an opportunity to ask questions.  Verbal consent was obtained before the procedure.    Verification of correct patient identity, procedure, site, and side was performed prior to start of procedure.    The patient was in the sitting position and using aseptic technique, the skin was prepped with alcohol and a small amount of ethyl chloride. Using a 25G needle, a mixture of 4mL of 1% lidocaine and 40mg  of triamcinolone acetonide (Kenalog) was injected easily into the right knee joint via an anterolateral approach. The knee was cycled several times and a sterile bandage was applied. The patient tolerated the procedure well without any complication.    Sharlee Blew, PA-C  Teaching Associate   [facility]

## 2017-03-27 NOTE — Patient Instructions (Signed)
We saw you today for the right knee.  We went over the images and then play nothing is broken or dislocated, however you do have some partial tearing to the patellar tendon along with arthritis of the knee And outside of the knee and complex tearing of your meniscus.  We talked about how nothing dangerous is occurring, and the patellar tendon tear doesn't need surgery and should heal well nonoperatively.  We did talk about how to make your symptoms better, and taking ibuprofen as needed for pain management, along with a cortisone injection provided today, physical therapy focused on progressive strengthening, and a new brace to help with your stability. You should also continue to ice and elevate.  We will plan on seeing you back for follow-up in 6 weeks to make sure the knee is feeling and functioning better.  If you continue to have symptoms at that point we can further discuss arthroscopic cleanup surgery, however hopefully that is not necessary.

## 2017-03-27 NOTE — Progress Notes (Signed)
Vital signs were documented and reported to the provider face to face.    ""Elevated Blood Pressure" handout given, patient instructed to see PCP.    Bill SetonNatalya Moore, Bill Bartok, MA    [facility]

## 2017-03-30 ENCOUNTER — Encounter (HOSPITAL_BASED_OUTPATIENT_CLINIC_OR_DEPARTMENT_OTHER): Payer: No Typology Code available for payment source | Admitting: Medical

## 2017-05-15 ENCOUNTER — Ambulatory Visit: Payer: 59 | Attending: Orthopaedic Surgery | Admitting: Orthopaedic Surgery

## 2017-05-15 ENCOUNTER — Encounter (HOSPITAL_BASED_OUTPATIENT_CLINIC_OR_DEPARTMENT_OTHER): Payer: Self-pay | Admitting: Orthopaedic Surgery

## 2017-05-15 VITALS — BP 155/82 | HR 80 | Ht 70.51 in | Wt 249.0 lb

## 2017-05-15 DIAGNOSIS — M23300 Other meniscus derangements, unspecified lateral meniscus, right knee: Secondary | ICD-10-CM | POA: Insufficient documentation

## 2017-05-15 DIAGNOSIS — Z6835 Body mass index (BMI) 35.0-35.9, adult: Secondary | ICD-10-CM

## 2017-05-15 DIAGNOSIS — M1710 Unilateral primary osteoarthritis, unspecified knee: Secondary | ICD-10-CM | POA: Insufficient documentation

## 2017-05-15 DIAGNOSIS — M23203 Derangement of unspecified medial meniscus due to old tear or injury, right knee: Secondary | ICD-10-CM | POA: Insufficient documentation

## 2017-05-15 NOTE — Progress Notes (Signed)
SUBJECTIVE:  Patient comes back for follow-up in regards to right knee pain, instability, and weakness, with MRI imaging showing partial intersubstance patellar tendon tearing and status post patellar reconstruction, severe patellofemoral arthritis, and medial and lateral degenerative meniscus tears.  He has a history of patellar tendon repair in 2014.  At his last appointment we provided a one-time cortisone injection along with home exercises and instructions to take anti-inflammatories as needed.  He reports less than 20% improvement after the cortisone injection, with continued knee pain which is primarily located anterior, medial, and lateral.  The activities he has difficulty with include sleeping, stairs, kneeling.    OBJECTIVE:  BP (!) 155/82    Pulse 80    Ht 5' 10.51" (1.791 m)    Wt (!) 249 lb (112.9 kg)    SpO2 99%    BMI 35.21 kg/m \    Well healed prior incision, are no deformities, normal muscle tone and bulk. Mild to moderate effusion present. The legs are in normal alignment. Range of motion is from 0-120 without pain at the extremes.  There is no ligamentous laxity to varus, valgus, rotatory, anterior and posterior stress. There is no posterior sag. There is mild joint line tenderness medially. There is mild joint line tenderness laterally. The quadriceps mechanism is intact and can maintain a straight leg raise against resistance without pain. No tenderness to palpation at the patellar tendon. There is no tenderness over the medial retinaculum.  There is crepitus in the patellofemoral joint. There is pain but no apprehension with manipulation of the patella. No motor or sensory deficits distally in the foot or ankle.  Easily palpable pedal pulses.  The uninvolved knee has no lesions of the skin, no deformity, no swelling, and has full range of motion and strength.    RADIOGRAPHS  Partial thickness intrasubstance patellar tendon tear, patellar alto, medial and lateral meniscus tears with moderate  to severe patellofemoral and lateral compartment cartilage loss.  Thickening and heterogeneity of the medial, lateral and anterior patellar retinaculum consistent with prior surgery.    ASSESSMENT:  Right knee severe patellofemoral arthritis in the setting of prior patellar tendon repair with continued intersubstance partial thickness patellar tendon tearing and medial and lateral meniscus tears    PLAN:  All the clinical and radiographic findings were reviewed with the patient. The anatomy, natural history, treatment options both operative and non-operative were discussed in detail. The rationale for operative intervention, the general risks and benefits of surgery, the specific surgical risks and benefits pertaining to their situation, the expected perioperative course and postoperative recovery were outlined. They showed good understanding, asked appropriate questions which were answered and wish to proceed with surgery. We will start making arrangements to schedule surgery along their requested time frame and will see them back accordingly for their preoperative appointment and/or surgery.    Surgical listing for right knee diagnostic arthroscopy, chondroplasty debridement, partial medial and lateral meniscectomy    Sharlee Blew, PA-C  Teaching Associate   [facility] Department of Orthopaedics & Sports Medicine    I saw and evaluated the patient. All of the patient history, subjective reports, physical exam, and other findings were reviewed. I have reviewed the note as documented above with additions and/or corrections as noted below.    He has the symptoms and the findings on advanced imaging that corresponded and more importantly he feels really limited and has done a lot of reasonable nonoperative treatment that hasn't resolve the issues and he  understands the limitations of a cleanup-type knee arthroscopy and wishes to proceed and we will thus move forward per his wishes.    Cletis Mediahristopher Kweon,  M.D.  Assistant Professor  [employer] [facility] [employer] Department of Orthopaedics & Sports Medicine  Team Physician, [facility]

## 2017-05-15 NOTE — Patient Instructions (Signed)
You have been seen for the right knee today.  We reviewed all of the x-ray and MRI findings and unfortunately the main problem is patella arthritis and tearing of the meniscus. You do have some partial patellar tendon tearing but its working and we don't think this is the main source of your pain. We talked about the anatomy, how to think about this problem, the treatment options both surgical and non-surgical, and the reasons for doing surgery which is to clean up the wear and tear in the knee.  You have decided to go for surgery which is very reasonable and we will start making arrangements.  We provided a card for our surgery scheduler for you to contact once you have a better idea of when you would like to get the surgery done and then we will see you back 1 or 2 weeks before that for a dedicated preoperative appointment.      We appreciate trusting your care to the [facility]. All of the providers are committed to improving your quality of care and you should expect to receive periodic emails from [email or web address] asking questions about how you are doing. Your responses are used so we can keep better track of how to improve patient care and more importantly keep track of how good a job we are doing for you and how to serve you better.

## 2017-05-21 ENCOUNTER — Encounter (HOSPITAL_BASED_OUTPATIENT_CLINIC_OR_DEPARTMENT_OTHER): Payer: Self-pay | Admitting: Orthopaedic Surgery

## 2017-05-21 ENCOUNTER — Ambulatory Visit: Payer: 59 | Attending: Medical | Admitting: Orthopaedic Surgery

## 2017-05-21 VITALS — BP 153/104 | HR 91 | Temp 97.6°F | Ht 70.51 in | Wt 228.6 lb

## 2017-05-21 DIAGNOSIS — M23203 Derangement of unspecified medial meniscus due to old tear or injury, right knee: Secondary | ICD-10-CM | POA: Insufficient documentation

## 2017-05-21 NOTE — Patient Instructions (Signed)
You have been seen for the knee today in anticipation for upcoming surgery.  We talked about the risks and benefits, the rationale for surgical intervention and the goals that we expect and hope to accomplish from surgery.  We had all of the important paperwork signed and you met with the other staff to get everything else that you need for surgery. We also talked about how to take care of yourself right after surgery, which we also outlined below. If there are any problems before then please call and let us know.    Dressing    - After surgery you will wake up with a large white fluffy dressing covered by an ace wrap. If the ace wrap is too tight, you may re-wrap the leg without disturbing the fluffy white dressing. After 3 days you may remove the fluffy white dressing and you will see suture material and/or white paper strips; at this time you may now shower, we only ask you do not scrub or submerge the incision sites.   - Do not shower until 3 days (or 72 hours) after surgery   - The suture will be removed at the first post op appointment 10-14 days after surgery and the paper strips will fall off in 2-3 weeks.     Crutches  - You will be toe touch weightbearing with crutches for comfort for the first 1-2 weeks. As long as you have no terrible pain, swelling, or limping you can progress off the crutches.     Swelling Reduction  - Icing 4-5 times a day for 30 minutes  - Elevating the leg, keeping it flat straight and ramped up.  With increased swelling in the knee, you are at an increased risk for stiffness, and we do not want your knee to get stiff in a flexed position, so it is important to keep it completely extended.   - You will wake up with a compression ace wrap, which you should wear everyday. If you are unable to feel the ice over the ace wrap after the first 3 days, you can remove the ace wrap.     Medications  -  We will prescribe you a strong narcotic for pain management, this does have side effects,  such as constipation, nausea vomiting, itchy rash, and can make your head feel full/ lightheaded/ weird.   - To help prevent constipation and nausea/vomitting we will prescribe additional medications -- Senna for constipation and Zofran for nausea/vomitting. If you experience any itchiness you can take benadryl over the counter.   - Will also prescribe you ibuprofen and tylenol for pain management and to help with inflammation.   - After surgery you are at an increased risk for blood clots, and to help reduce this risk we will prescribe baby Asprin, which is taken once a day for 14 days. To also reduce this risk you should be doing ankle pumps.     Activity  - As long as you can fully straighten the knee equal to the other side, it's safe to start bending the knee, however you must maintain your knee extension after that.     We appreciate trusting your care to the [facility] [employer]. All of the providers are committed to improving your quality of care and you should expect to receive periodic emails from [email or web address] asking questions about how you are doing. Your responses are used so we can keep better track of how to improve patient care and  more importantly keep track of how good a job we are doing for you and how to serve you better.

## 2017-05-21 NOTE — Progress Notes (Signed)
Bill Rolly SalterRandall Difrancesco is a 52 year old year old male who comes for preoperative evaluation in anticipation for right knee diagnostic arthroscopy, chondroplasty debridement, partial medial and lateral meniscectomy to treat right knee severe patellofemoral arthritis in the setting of prior patellar tendon repair and medial and lateral meniscus tears. He would like to proceed with surgery, and has had no symptomatic changes. He understands the purpose of the surgery is to treat pain.     The preoperative patient health assessment was reviewed with the patient.  He has had anesthesia previously for right patellar tendon reconstruction, with no problems.  He denies cardiovascular issues such as MI, murmurs, clots, aneurysms.  However does have fluctuating high blood pressure, and plans on seeing his PCP for this tomorrow.  Denies respiratory problems including asthma, COPD, and sleep apnea.  Denies GERD, diabetes, kidney or liver disease.  No history of MRSA infection.  No known drug allergies.     PAST MEDICAL HISTORY   has no past medical history on file.    PAST SURGICAL HISTORY   has a past surgical history that includes UNLISTED PROCEDURE FEMUR/KNEE (Right, 2014).    ALLERGIES  Review of patient's allergies indicates:  No Known Allergies    MEDICATIONS  has a current medication list which includes the following prescription(s): naproxen.    FAMILY HISTORY  family history is not on file.    SOCIAL HISTORY   reports that he has never smoked. He has never used smokeless tobacco. He reports that he drinks about 0.6 oz of alcohol per week. He reports that he does not use drugs.    REVIEW OF SYMPTOMS  A complete review of systems was performed at the clinic visit.  Except for the positives and negatives included in the HPI, the remaining review of systems is negative.    PHYSICAL EXAM  Vital signs: There were no vitals taken for this visit.  General: Patient in no acute distress. Is alert and appropriate. Answers questions  and follows instructions well  Cardiovascular: RRR, no murmurs    Lungs: Good inspiratory effort, clear lungs bilaterally, no wheezes, rales, rhonchi.   Neck: Supple, good range of motion    IMPRESSION  Overall healthy male ready for right knee arthroscopic partial medial lateral meniscectomy and chondroplasty debridement, with plans of follow-up care out [place]    RECOMMENDATIONS  All of the findings were again reviewed with the patient. The rationale for operative intervention, the general risks and benefits of surgery, the specific surgical risks and benefits pertaining to their unique situation, the expected perioperative course and postoperative recovery were outlined. The mechanics of how the day of surgery will proceed and the aftercare in the several days and weeks after surgery before the first postoperative visit was outlined in detail as well. They showed good understanding, asked appropriate questions which were answered and wish to proceed with surgery.  Surgical consent was obtained, appropriate durable medical equipment was provided as indicated, and general nursing instructions were provided. We will see them on the day of surgery and proceed as planned.    Sharlee BlewLauren Soham Hollett, PA-C  [facility] [place] Department of Orthopaedics & Sports Medicine      Cletis Mediahristopher Kweon, M.D.  Assistant Professor  [employer] [facility] [employer] Department of Orthopaedics & Sports Medicine  Team Physician, [place]

## 2017-05-21 NOTE — Progress Notes (Signed)
PRE-OP CONSULTATION / TEACHING  [facility]    Name: Bill Moore  Date of Birth: 01/24/1966  Medical Record Number: [id number]     Patient was not accompanied by another individual during today's teaching.     Family Member Name(s):  none     Preoperative education was done for right knee diagnostic arthroscopy, chondroplasty, debridement, partial medial menisectomy per [facility] protocol.     Patient given the following literature:   Information About Your Healthcare   Preparing for Your Surgery Reminders   Medicines to Avoid Before Surgery    Pregnancy Testing Before Anesthesia and Surgery/Procedures [optional]   Preventing Blood Clots   Deep Vein Thrombosis   About Your Surgery Experience   After Your Orthopaedic Surgery    Constipation After Your Operation   Prescription Refills   Important Insurance Information [optional]   Video Resource for Patients   [facility] Center Driving/Parking Directions [optional]   [facility] Floor Map for Patients and Visitors [optional]    Patient was instructed to report [facility] the first floor on the scheduled date of surgery. They were also reminded to bring insurance card and photo ID, as this would be required at the time of check-in.    The importance of NPO [nothing per oral route] status prior to surgery was stressed. Two chlorhexidine gluconate [CHG] soap scrubs were dispensed during today's preoperative visit, and Bill Moore was instructed to shower the night before and the morning of surgery with CHG soap from the neck down. Bill Moore was instructed to not use any lotions, hair products, perfume or makeup on the day of surgery, and reminded to remove all jewelry and piercings prior to leaving home. Bill Moore was also instructed not to bring family/friends that are under the age of 817, as they are not permitted unless accompanied by another adult.     Bill Moore  has verbalized understanding and accepted all instructions. During this preoperative teaching, I answered multiple questions that the patient had. All inquiries were answered thoroughly, and clear instructions were provided. Bill Moore was encouraged to call our facility regarding any further questions or issues, both preoperatively and/or postoperatively.    Cory MunchKathy Giani Betzold, RN  [facility]

## 2017-05-26 ENCOUNTER — Ambulatory Visit
Admission: RE | Admit: 2017-05-26 | Discharge: 2017-05-26 | Disposition: A | Discharge status: Home / Self Care | Payer: 59 | Attending: Orthopaedic Surgery | Admitting: Orthopaedic Surgery

## 2017-05-26 ENCOUNTER — Ambulatory Visit (HOSPITAL_BASED_OUTPATIENT_CLINIC_OR_DEPARTMENT_OTHER): Payer: Self-pay | Admitting: Orthopaedic Surgery

## 2017-05-26 DIAGNOSIS — M23351 Other meniscus derangements, posterior horn of lateral meniscus, right knee: Secondary | ICD-10-CM

## 2017-05-26 DIAGNOSIS — M23321 Other meniscus derangements, posterior horn of medial meniscus, right knee: Secondary | ICD-10-CM

## 2017-05-26 DIAGNOSIS — M23221 Derangement of posterior horn of medial meniscus due to old tear or injury, right knee: Secondary | ICD-10-CM | POA: Insufficient documentation

## 2017-05-26 DIAGNOSIS — M94261 Chondromalacia, right knee: Secondary | ICD-10-CM

## 2017-05-26 DIAGNOSIS — M23251 Derangement of posterior horn of lateral meniscus due to old tear or injury, right knee: Secondary | ICD-10-CM | POA: Insufficient documentation

## 2017-05-26 DIAGNOSIS — Z9889 Other specified postprocedural states: Secondary | ICD-10-CM | POA: Insufficient documentation

## 2017-06-08 ENCOUNTER — Encounter (HOSPITAL_BASED_OUTPATIENT_CLINIC_OR_DEPARTMENT_OTHER): Payer: 59 | Admitting: Medical

## 2017-06-08 ENCOUNTER — Telehealth (HOSPITAL_BASED_OUTPATIENT_CLINIC_OR_DEPARTMENT_OTHER): Payer: Self-pay

## 2017-06-08 NOTE — Telephone Encounter (Signed)
Out going call to patient.  Left message on personal voicemail asking if he was going to make his 230 post op appointment today 06/08/17.    When patient calls back please place on Lauren Colpo's schedule on 06/11/17 at 100 per Lauren.                  Rudean HaskellArnold Anshika Pethtel,CMA  [facility]  at [facility].

## 2017-06-09 ENCOUNTER — Ambulatory Visit (HOSPITAL_COMMUNITY)
Admission: EM | Admit: 2017-06-09 | Discharge: 2017-06-09 | Disposition: A | Discharge status: Home / Self Care | Payer: No Typology Code available for payment source

## 2017-06-09 DIAGNOSIS — Z4802 Encounter for removal of sutures: Secondary | ICD-10-CM

## 2017-06-09 NOTE — ED Notes (Signed)
Pt had knee surgery in [place] two weeks ago, has two sutures on knee needing to be removed. Sutures removed. Mild bleeding to site of sutures, non stick dressing applied. Pt has no further questions, will make a follow up appt with ortho since he just moved here. Ambulated out without issue, denies pain.

## 2017-06-10 ENCOUNTER — Encounter (HOSPITAL_BASED_OUTPATIENT_CLINIC_OR_DEPARTMENT_OTHER): Payer: Self-pay | Admitting: Orthopaedic Surgery

## 2017-06-16 ENCOUNTER — Telehealth (HOSPITAL_BASED_OUTPATIENT_CLINIC_OR_DEPARTMENT_OTHER): Payer: Self-pay | Admitting: Orthopaedic Surgery

## 2017-06-16 NOTE — Telephone Encounter (Signed)
Can bike and swim and slowly progress life activities as tolerated after a month from now. Call us if ever has any other questions.

## 2017-06-16 NOTE — Telephone Encounter (Signed)
[place] and is not planning to come back any time soon. States he had his stitches taken out at an Urgent care, advised patient I would have the RN call to go over any concerns he may have.    [phone]    Char Prideaux-Geiszler, PSS/PCC Supervisor  [facility]

## 2017-06-16 NOTE — Telephone Encounter (Signed)
Bill Moore is 3 weeks s/p Right knee diagnostic arthroscopy, partial medial and lateral meniscectomies by Dr. Corinna Lineshris Kweon on 05/26/17. Future appointment scheduled with Dr. Kerry HoughKweon on 07/09/17.  _____________  I left a detailed message on pt's clearly identified VM asking him to call me back directly or call [phone], option 8 and ask to speak with one of the nurses about:   Any pain?   Condition of incision/knee such as swelling, ROM, drainage, etc.   Need any follow up where ever he is?    Routing to Dr. Corinna Lineshris Kweon for comments/recommendations.    Park MeoPat Corrigan Kretschmer, RN  [facility]

## 2017-06-17 NOTE — Telephone Encounter (Signed)
I spoke with Imir about the following:   reviewed Dr. Bosie HelperKweon's comments.   States he is doing relatively well with very little swelling or pain.   Plan:   [place] so [facility] on 07/09/17.   Encouraged pt to call us back with any questions or concerns. Pt states understanding and agreement.     Park MeoPat Jacqulynn Shappell, RN  [place] [facility]

## 2017-07-09 ENCOUNTER — Encounter (HOSPITAL_BASED_OUTPATIENT_CLINIC_OR_DEPARTMENT_OTHER): Payer: 59 | Admitting: Orthopaedic Surgery

## 2020-03-02 ENCOUNTER — Encounter (HOSPITAL_COMMUNITY): Payer: Self-pay

## 2020-03-02 ENCOUNTER — Emergency Department (HOSPITAL_COMMUNITY): Payer: BC Managed Care – PPO

## 2020-03-02 ENCOUNTER — Emergency Department (HOSPITAL_COMMUNITY)
Admission: EM | Admit: 2020-03-02 | Discharge: 2020-03-03 | Disposition: A | Discharge status: Home / Self Care | Payer: BC Managed Care – PPO | Attending: Emergency Medicine | Admitting: Emergency Medicine

## 2020-03-02 ENCOUNTER — Other Ambulatory Visit: Payer: Self-pay

## 2020-03-02 DIAGNOSIS — Z20822 Contact with and (suspected) exposure to covid-19: Secondary | ICD-10-CM | POA: Insufficient documentation

## 2020-03-02 DIAGNOSIS — J45909 Unspecified asthma, uncomplicated: Secondary | ICD-10-CM | POA: Diagnosis not present

## 2020-03-02 DIAGNOSIS — M545 Low back pain, unspecified: Secondary | ICD-10-CM | POA: Diagnosis not present

## 2020-03-02 DIAGNOSIS — R059 Cough, unspecified: Secondary | ICD-10-CM | POA: Insufficient documentation

## 2020-03-02 DIAGNOSIS — R112 Nausea with vomiting, unspecified: Secondary | ICD-10-CM | POA: Insufficient documentation

## 2020-03-02 DIAGNOSIS — R1031 Right lower quadrant pain: Secondary | ICD-10-CM | POA: Insufficient documentation

## 2020-03-02 DIAGNOSIS — R509 Fever, unspecified: Secondary | ICD-10-CM | POA: Insufficient documentation

## 2020-03-02 LAB — CBC
HCT: 50.2 % (ref 39.0–52.0)
Hemoglobin: 16.7 g/dL (ref 13.0–17.0)
MCH: 30.5 pg (ref 26.0–34.0)
MCHC: 33.3 g/dL (ref 30.0–36.0)
MCV: 91.6 fL (ref 80.0–100.0)
Platelets: 222 10*3/uL (ref 150–400)
RBC: 5.48 MIL/uL (ref 4.22–5.81)
RDW: 13.3 % (ref 11.5–15.5)
WBC: 7.5 10*3/uL (ref 4.0–10.5)
nRBC: 0 % (ref 0.0–0.2)

## 2020-03-02 LAB — COMPREHENSIVE METABOLIC PANEL
ALT: 34 U/L (ref 0–44)
AST: 50 U/L — ABNORMAL HIGH (ref 15–41)
Albumin: 4.8 g/dL (ref 3.5–5.0)
Alkaline Phosphatase: 61 U/L (ref 38–126)
Anion gap: 14 (ref 5–15)
BUN: 19 mg/dL (ref 6–20)
CO2: 26 mmol/L (ref 22–32)
Calcium: 9.4 mg/dL (ref 8.9–10.3)
Chloride: 96 mmol/L — ABNORMAL LOW (ref 98–111)
Creatinine, Ser: 1.09 mg/dL (ref 0.61–1.24)
GFR, Estimated: >60 mL/min (ref >60)
Glucose, Bld: 110 mg/dL — ABNORMAL HIGH (ref 70–99)
Potassium: 3.7 mmol/L (ref 3.5–5.1)
Sodium: 136 mmol/L (ref 135–145)
Total Bilirubin: 1.7 mg/dL — ABNORMAL HIGH (ref 0.3–1.2)
Total Protein: 9.1 g/dL — ABNORMAL HIGH (ref 6.5–8.1)

## 2020-03-02 LAB — URINALYSIS, ROUTINE W REFLEX MICROSCOPIC
Bilirubin Urine: NEGATIVE
Glucose, UA: NEGATIVE mg/dL
Hgb urine dipstick: NEGATIVE
Ketones, ur: 5 mg/dL — AB
Leukocytes,Ua: NEGATIVE
Nitrite: NEGATIVE
Protein, ur: 30 mg/dL — AB
Specific Gravity, Urine: 1.033 — ABNORMAL HIGH (ref 1.005–1.030)
pH: 5 (ref 5.0–8.0)

## 2020-03-02 LAB — LIPASE, BLOOD: Lipase: 23 U/L (ref 11–51)

## 2020-03-02 LAB — RESP PANEL BY RT-PCR (FLU A&B, COVID) ARPGX2
Influenza A by PCR: NEGATIVE
Influenza B by PCR: NEGATIVE
SARS Coronavirus 2 by RT PCR: NEGATIVE

## 2020-03-02 MED ORDER — SODIUM CHLORIDE 0.9 % IV BOLUS
1000.0000 mL | Freq: Once | INTRAVENOUS | Status: AC
  Administered 2020-03-02: 1000 mL via INTRAVENOUS

## 2020-03-02 MED ORDER — ONDANSETRON HCL 4 MG PO TABS: 4.0000 mg | ORAL_TABLET | Freq: Four times a day (QID) | ORAL | 0 refills | Status: DC

## 2020-03-02 MED ORDER — ONDANSETRON HCL 4 MG/2ML IJ SOLN
4.0000 mg | Freq: Once | INTRAMUSCULAR | Status: AC
  Administered 2020-03-02: 4 mg via INTRAVENOUS
  Filled 2020-03-02: qty 2

## 2020-03-02 MED ORDER — IOHEXOL 300 MG/ML  SOLN
100.0000 mL | Freq: Once | INTRAMUSCULAR | Status: AC | PRN
  Administered 2020-03-02: 100 mL via INTRAVENOUS

## 2020-03-02 NOTE — ED Provider Notes (Signed)
[facility]-EMERGENCY DEPT Provider Note   CSN: [id number] Arrival date & time: 03/02/20  1546     History Chief Complaint  Patient presents with  . Abdominal Pain  . Emesis  . Fever  . Cough    Francisco Bates is a 54 y.o. male.  HPI   54 year old male with history of asthma, patellar tendon rupture, recovering alcoholic, presents emergency department today for evaluation of nausea and vomiting that started about 3 days ago.  He also reports some cramping to his abdomen and back.  He has some intermittent lower abdominal pain as well.  He denies any diarrhea, constipation.  He denies any dysuria, frequency or urgency but does report dark urine for the last few days.  He has had associated sweats/chills and an intermittent cough.  Past Medical History:  Diagnosis Date  . Asthma    as child - no problems since  . Patellar tendon rupture    RT knee  . Rash    back  . Recovering alcoholic [facility])     Patient Active Problem List   Diagnosis Date Noted  . Chronic alcohol abuse   . Alcohol withdrawal (HCC) 10/19/2014  . 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 Nausea and vomiting in adult 10/19/2014  . Expected blood loss anemia 11/16/2012  . Obese 11/16/2012  . Right patellar tendon rupture 11/15/2012  . Alcohol dependence (HCC) 03/25/2011    Past Surgical History:  Procedure Laterality Date  . ACHILLES TENDON SURGERY    . KNEE SURGERY     1995  . PATELLAR TENDON REPAIR Right 11/15/2012   Procedure: OPEN REPAIR RIGHT PATELLA TENDON ;  Surgeon: [name], MD;  Location: [facility];  Service: Orthopedics;  Laterality: Right;       Family History  Problem Relation Age of Onset  . Hypertension Other   . Cancer Mother   . Cancer Father     Social History   Tobacco Use  . Smoking status: Never Smoker  . Smokeless tobacco: Never Used  Vaping Use  . Vaping Use: Never used  Substance Use Topics  . Alcohol use: No    Comment: quit in 08/1974  .  Drug use: No    Home Medications Prior to Admission medications   Medication Sig Start Date End Date Taking? Authorizing Provider  ibuprofen (ADVIL) 100 MG tablet Take 200 mg by mouth every 6 (six) hours as needed for fever.   Yes [provider]  Multiple Vitamin (MULTIVITAMIN WITH MINERALS) TABS tablet Take 1 tablet by mouth daily. 10/21/14  Yes Dhungel, Nishant, MD  chlordiazePOXIDE (LIBRIUM) 5 MG capsule Please dispense 18 pills - Take 1 pill three times a day for 3 days, then Take 1 pill two times a day for 3 days, then Take 1 pill once a day for 3 days and stop. Patient not taking: No sig reported 10/21/14   Dhungel, Nishant, MD  omeprazole (PRILOSEC) 40 MG capsule Take 1 capsule (40 mg total) by mouth daily. Patient not taking: No sig reported 10/21/14   Dhungel, Nishant, MD  ondansetron (ZOFRAN) 4 MG tablet Take 1 tablet (4 mg total) by mouth every 6 (six) hours. 03/02/20   Chenel Wernli S, PA-C  promethazine (PHENERGAN) 12.5 MG tablet Take 1 tablet (12.5 mg total) by mouth every 6 (six) hours as needed for nausea or vomiting. Patient not taking: No sig reported 10/21/14   Dhungel, Theda Belfast, MD    Allergies    Patient has no  known allergies.  Review of Systems   Review of Systems  Constitutional: Negative for fever.  HENT: Negative for ear pain and sore throat.   Eyes: Negative for visual disturbance.  Respiratory: Negative for cough and shortness of breath.   Cardiovascular: Negative for chest pain.  Gastrointestinal: Positive for nausea and vomiting. Negative for abdominal pain, constipation and diarrhea.  Genitourinary: Negative for dysuria and hematuria.  Musculoskeletal: Negative for back pain.  Skin: Negative for color change and rash.  Neurological: Negative for seizures and syncope.  All other systems reviewed and are negative.   Physical Exam Updated Vital Signs BP (!) 117/93 (BP Location: Right Arm)   Pulse 89   Temp 98.4 F (36.9 C) (Oral)   Resp 18    Ht 5\' 11"  (1.803 m)   Wt 102.1 kg   SpO2 96%   BMI 31.38 kg/m   Physical Exam Vitals and nursing note reviewed.  Constitutional:      Appearance: He is well-developed and well-nourished.  HENT:     Head: Normocephalic and atraumatic.  Eyes:     Conjunctiva/sclera: Conjunctivae normal.  Cardiovascular:     Rate and Rhythm: Normal rate and regular rhythm.     Heart sounds: Normal heart sounds. No murmur heard.   Pulmonary:     Effort: Pulmonary effort is normal. No respiratory distress.     Breath sounds: Normal breath sounds. No wheezing, rhonchi or rales.  Abdominal:     Palpations: Abdomen is soft.     Tenderness: There is abdominal tenderness in the right lower quadrant, suprapubic area and left lower quadrant. There is no guarding or rebound.  Musculoskeletal:        General: No edema.     Cervical back: Neck supple.  Skin:    General: Skin is warm and dry.  Neurological:     Mental Status: He is alert.  Psychiatric:        Mood and Affect: Mood and affect normal.     ED Results / Procedures / Treatments   Labs (all labs ordered are listed, but only abnormal results are displayed) Labs Reviewed  COMPREHENSIVE METABOLIC PANEL - Abnormal; Notable for the following components:      Result Value   Chloride 96 (*)    Glucose, Bld 110 (*)    Total Protein 9.1 (*)    AST 50 (*)    Total Bilirubin 1.7 (*)    All other components within normal limits  URINALYSIS, ROUTINE W REFLEX MICROSCOPIC - Abnormal; Notable for the following components:   Color, Urine AMBER (*)    APPearance HAZY (*)    Specific Gravity, Urine 1.033 (*)    Ketones, ur 5 (*)    Protein, ur 30 (*)    Bacteria, UA RARE (*)    All other components within normal limits  RESP PANEL BY RT-PCR (FLU A&B, COVID) ARPGX2  LIPASE, BLOOD  CBC    EKG None  Radiology DG Chest Portable 1 View  Result Date: 03/02/2020 CLINICAL DATA:  Cough EXAM: PORTABLE CHEST 1 VIEW COMPARISON:  04/20/2009  FINDINGS: Minimal bibasilar opacities, likely atelectasis. Heart is normal size. No effusions or acute bony abnormality. IMPRESSION: Bibasilar atelectasis. Electronically Signed   By: 04/22/2009 M.D.   On: 03/02/2020 21:44    Procedures Procedures (including critical care time)  Medications Ordered in ED Medications  sodium chloride 0.9 % bolus 1,000 mL (1,000 mLs Intravenous New Bag/Given (Non-Interop) 03/02/20 2148)  ondansetron (ZOFRAN) injection 4  mg (4 mg Intravenous Given 03/02/20 2147)    ED Course  I have reviewed the triage vital signs and the nursing notes.  Pertinent labs & imaging results that were available during my care of the patient were reviewed by me and considered in my medical decision making (see chart for details).    MDM Rules/Calculators/A&P                          54 year old male presenting for evaluation of nausea vomiting.  Has some lower abdominal discomfort on exam.  Is also had some slight/chills at home as well as a cough.  Reviewed/interpreted labs CBC, CMP, lipase are unremarkable.  UA without signs of UTI.  Covid testing is negative.  Chest x-ray shows bibasilar atelectasis but evidence of pneumonia or other acute abnormality  At shift change, care transitioned to Elpidio Anis to f/u on pending ct. If negative, suspect viral illness and will d/c with symptomatic tx.   Final Clinical Impression(s) / ED Diagnoses Final diagnoses:  Non-intractable vomiting with nausea, unspecified vomiting type    Rx / DC Orders ED Discharge Orders         Ordered    ondansetron (ZOFRAN) 4 MG tablet  Every 6 hours        03/02/20 2320           Victorio Creeden S, PA-C 03/02/20 2341    Rozelle Logan, DO 03/02/20 2359

## 2020-03-02 NOTE — ED Notes (Signed)
Pt. Removed monitor and blood pressure cuff and sitting in chair. Pt. Declined vital signs at this time.

## 2020-03-02 NOTE — ED Provider Notes (Deleted)
Pt left prior to being seen. I did not establish care with him.    Karrie Meres, [place] 03/02/20 2122

## 2020-03-02 NOTE — Discharge Instructions (Addendum)
You were given a prescription for zofran to help with your nausea. Please take as directed.  You have been given a prescription for worsening longstanding low back pain. Use the patches as directed. See a primary care doctor if pain continues.   Please follow up with your primary doctor within the next 5-7 days.  If you do not have a primary care provider, information for a healthcare clinic has been provided for you to make arrangements for follow up care. Please return to the ER sooner if you have any new or worsening symptoms, or if you have any of the following symptoms:  Abdominal pain that does not go away.  You have a fever.  You keep throwing up (vomiting).  The pain is felt only in portions of the abdomen. Pain in the right side could possibly be appendicitis. In an adult, pain in the left lower portion of the abdomen could be colitis or diverticulitis.  You pass bloody or black tarry stools.  There is bright red blood in the stool.  The constipation stays for more than 4 days.  There is belly (abdominal) or rectal pain.  You do not seem to be getting better.  You have any questions or concerns.

## 2020-03-02 NOTE — ED Triage Notes (Addendum)
Patient c/o abdominal cramping, intermittent fever, cough,and vomiting x 3 days.

## 2020-03-03 MED ORDER — KETOROLAC TROMETHAMINE 30 MG/ML IJ SOLN
30.0000 mg | Freq: Once | INTRAMUSCULAR | Status: AC
  Administered 2020-03-03: 30 mg via INTRAVENOUS
  Filled 2020-03-03: qty 1

## 2020-03-03 MED ORDER — LIDOCAINE 5 % EX PTCH: 1.0000 | MEDICATED_PATCH | CUTANEOUS | 0 refills | Status: DC

## 2020-03-03 NOTE — ED Provider Notes (Signed)
Patient signed out at end of shift by Francisco Bates, Francisco Bates, pending CT for evaluation of abdominal pain, nausea, vomiting and diarrhea x 3 days. No fever. No history of bowel disease. No hematemesis or bloody stools. Also c/o back pain that is more chronic but seems worse with current symptoms. No better with Aleve at home.   VSS in ED. Mildly tachycardic, better after fluids. Labs are nonconcerning, UA concentrated, likely mild dehydration. No infection. CT shows diverticulosis without -itis. He is eating and drinking in ED without further vomiting.   Likely viral cause of symptoms requiring supportive care. Return precautions discussed with the patient. Will refer to primary care to become established for routine care.     Francisco Anis, Francisco Bates 03/03/20 0153    Francisco Bates, Francisco Ruiz, MD 03/03/20 Earle Gell

## 2020-03-03 NOTE — ED Notes (Signed)
Patient given sandwich and ginger ale 

## 2021-09-05 ENCOUNTER — Ambulatory Visit
Admission: EM | Admit: 2021-09-05 | Discharge: 2021-09-05 | Disposition: A | Discharge status: Home / Self Care | Payer: 59 | Attending: Family Medicine | Admitting: Family Medicine

## 2021-09-05 DIAGNOSIS — R350 Frequency of micturition: Secondary | ICD-10-CM

## 2021-09-05 LAB — POCT URINALYSIS DIP (MANUAL ENTRY)
Blood, UA: NEGATIVE
Glucose, UA: NEGATIVE mg/dL
Leukocytes, UA: NEGATIVE
Nitrite, UA: NEGATIVE
Protein Ur, POC: 30 mg/dL — AB
Spec Grav, UA: >=1.03 — AB (ref 1.010–1.025)
Urobilinogen, UA: 1 E.U./dL
pH, UA: 5.5 (ref 5.0–8.0)

## 2021-09-05 NOTE — Discharge Instructions (Addendum)
The urinalysis did not show signs of infection, and it did not have any sugar on it.  I sent it for urine culture to make sure there is no sign of infection on that  Try taking Sudafed as needed for this symptom of ear fullness, to see if it will help.   I think you will need to follow-up with primary care office to make sure these problems are improving

## 2021-09-05 NOTE — ED Provider Notes (Signed)
[name]    CSN: [id number] Arrival date & time: 09/05/21  1645      History   Chief Complaint Chief Complaint  Patient presents with   Ear Fullness   Urinary Frequency    HPI Francisco Bates is a 56 y.o. male.    Ear Fullness  Urinary Frequency   Here for fullness and pain in his left ear.  It began about a week ago.  The pain has subsided but he is still feeling off balance sometimes and still feels like his ear is full.  The ear has been popping sometimes.  He has not really noticed any upper respiratory symptoms or congestion.  He has also had some urinary frequency for about a month but no dysuria or hematuria.  No abdominal pain.  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 Authorizing Provider  ibuprofen (ADVIL) 100 MG tablet Take 200 mg by mouth every 6 (six) hours as needed for fever.    [provider]  lidocaine (LIDODERM) 5 % Place 1 patch onto the skin daily. Remove & Discard patch within 12 hours or as directed by MD 03/03/20   Elpidio Anis, PA-C  Multiple Vitamin  (MULTIVITAMIN WITH MINERALS) TABS tablet Take 1 tablet by mouth daily. 10/21/14   Dhungel, Nishant, MD  ondansetron (ZOFRAN) 4 MG tablet Take 1 tablet (4 mg total) by mouth every 6 (six) hours. 03/02/20   Couture, Cortni S, PA-C    Family History Family History  Problem Relation Age of Onset   Hypertension Other    Cancer Mother    Cancer Father     Social History Social History   Tobacco Use   Smoking status: Never   Smokeless tobacco: Never  Vaping Use   Vaping Use: Never used  Substance Use Topics   Alcohol use: No    Comment: quit in 08/1974   Drug use: No     Allergies   Patient has no known allergies.   Review of Systems Review of Systems  Genitourinary:  Positive for frequency.     Physical Exam Triage Vital Signs ED Triage Vitals  Enc Vitals Group     BP 09/05/21 1711 138/86     Pulse Rate 09/05/21 1708 83     Resp 09/05/21 1708 17     Temp 09/05/21 1708 98 F (36.7 C)     Temp Source 09/05/21 1708 Oral     SpO2  09/05/21 1708 96 %     Weight --      Height --      Head Circumference --      Peak Flow --      Pain Score 09/05/21 1707 4     Pain Loc --      Pain Edu? --      Excl. in GC? --    No data found.  Updated Vital Signs BP 138/86   Pulse 83   Temp 98 F (36.7 C) (Oral)   Resp 17   SpO2 96%   Visual Acuity Right Eye Distance:   Left Eye Distance:   Bilateral Distance:    Right Eye Near:   Left Eye Near:    Bilateral Near:     Physical Exam Vitals reviewed.  Constitutional:      General: He is not in acute distress.    Appearance: He is not toxic-appearing.  HENT:     Right Ear: Tympanic membrane and ear canal normal.     Left Ear: Tympanic membrane and ear canal normal.     Nose: Nose normal.     Mouth/Throat:     Mouth: Mucous membranes are moist.     Pharynx: No oropharyngeal exudate or posterior oropharyngeal erythema.  Eyes:     Extraocular Movements: Extraocular movements intact.     Conjunctiva/sclera:  Conjunctivae normal.     Pupils: Pupils are equal, round, and reactive to light.  Cardiovascular:     Rate and Rhythm: Normal rate and regular rhythm.     Heart sounds: No murmur heard. Pulmonary:     Effort: Pulmonary effort is normal.     Breath sounds: Normal breath sounds.  Abdominal:     Palpations: Abdomen is soft.     Tenderness: There is no abdominal tenderness.  Musculoskeletal:     Cervical back: Neck supple.  Lymphadenopathy:     Cervical: No cervical adenopathy.  Skin:    Capillary Refill: Capillary refill takes less than 2 seconds.     Coloration: Skin is not jaundiced or pale.  Neurological:     General: No focal deficit present.     Mental Status: He is alert and oriented to person, place, and time.  Psychiatric:        Behavior: Behavior normal.      UC Treatments / Results  Labs (all labs ordered are listed, but only abnormal results are displayed) Labs Reviewed  POCT URINALYSIS DIP (MANUAL ENTRY) - Abnormal; Notable for the following components:      Result Value   Color, UA orange (*)    Bilirubin, UA small (*)    Ketones, POC UA trace (5) (*)    Spec Grav, UA >=1.030 (*)    Protein Ur, POC =30 (*)    All other components within normal limits  URINE CULTURE    EKG   Radiology No results found.  Procedures Procedures (including critical care time)  Medications Ordered in UC Medications - No data to display  Initial Impression / Assessment and Plan / UC Course  I have reviewed the triage vital signs and the nursing notes.  Pertinent labs & imaging results that were available during my care of the patient were reviewed by me and considered in my medical decision making (see chart for details).     Urinalysis does not show any white cells or red cells, but it does show it to be concentrated.  There is no sugar.  Final Clinical Impressions(s) / UC Diagnoses   Final diagnoses:  Urinary frequency     Discharge Instructions      The  urinalysis did not show signs of infection, and it did not have any sugar on it.  I sent it for urine culture to make sure there is no sign of infection on that  Try taking Sudafed as needed for this symptom of ear fullness, to see if it will help.   I think you will need to follow-up with primary care office to make sure these problems are improving     ED Prescriptions   None    PDMP not reviewed this encounter.   Zenia Resides, MD 09/05/21 1725

## 2021-09-05 NOTE — ED Triage Notes (Signed)
Pt presents with left ear fullness & pain for over a week.  Pt also complains of urinary frequency X 1 month.

## 2021-09-06 LAB — URINE CULTURE

## 2022-05-09 ENCOUNTER — Emergency Department (HOSPITAL_BASED_OUTPATIENT_CLINIC_OR_DEPARTMENT_OTHER)
Admission: EM | Admit: 2022-05-09 | Discharge: 2022-05-09 | Disposition: A | Discharge status: Home / Self Care | Payer: Medicaid Other | Attending: Emergency Medicine | Admitting: Emergency Medicine

## 2022-05-09 ENCOUNTER — Other Ambulatory Visit: Payer: Self-pay

## 2022-05-09 ENCOUNTER — Emergency Department (HOSPITAL_BASED_OUTPATIENT_CLINIC_OR_DEPARTMENT_OTHER): Payer: Medicaid Other

## 2022-05-09 ENCOUNTER — Encounter (HOSPITAL_BASED_OUTPATIENT_CLINIC_OR_DEPARTMENT_OTHER): Payer: Self-pay

## 2022-05-09 DIAGNOSIS — Z87891 Personal history of nicotine dependence: Secondary | ICD-10-CM | POA: Insufficient documentation

## 2022-05-09 DIAGNOSIS — I10 Essential (primary) hypertension: Secondary | ICD-10-CM | POA: Diagnosis not present

## 2022-05-09 DIAGNOSIS — Z79899 Other long term (current) drug therapy: Secondary | ICD-10-CM | POA: Insufficient documentation

## 2022-05-09 DIAGNOSIS — J45909 Unspecified asthma, uncomplicated: Secondary | ICD-10-CM | POA: Insufficient documentation

## 2022-05-09 DIAGNOSIS — R051 Acute cough: Secondary | ICD-10-CM | POA: Diagnosis not present

## 2022-05-09 DIAGNOSIS — R1013 Epigastric pain: Secondary | ICD-10-CM | POA: Diagnosis not present

## 2022-05-09 DIAGNOSIS — R059 Cough, unspecified: Secondary | ICD-10-CM | POA: Diagnosis not present

## 2022-05-09 DIAGNOSIS — Z1152 Encounter for screening for COVID-19: Secondary | ICD-10-CM | POA: Diagnosis not present

## 2022-05-09 LAB — COMPREHENSIVE METABOLIC PANEL
ALT: 30 U/L (ref 0–44)
AST: 41 U/L (ref 15–41)
Albumin: 4 g/dL (ref 3.5–5.0)
Alkaline Phosphatase: 76 U/L (ref 38–126)
Anion gap: 6 (ref 5–15)
BUN: 15 mg/dL (ref 6–20)
CO2: 24 mmol/L (ref 22–32)
Calcium: 8.5 mg/dL — ABNORMAL LOW (ref 8.9–10.3)
Chloride: 102 mmol/L (ref 98–111)
Creatinine, Ser: 0.99 mg/dL (ref 0.61–1.24)
GFR, Estimated: >60 mL/min (ref >60)
Glucose, Bld: 106 mg/dL — ABNORMAL HIGH (ref 70–99)
Potassium: 3.5 mmol/L (ref 3.5–5.1)
Sodium: 132 mmol/L — ABNORMAL LOW (ref 135–145)
Total Bilirubin: 1.4 mg/dL — ABNORMAL HIGH (ref 0.3–1.2)
Total Protein: 8.4 g/dL — ABNORMAL HIGH (ref 6.5–8.1)

## 2022-05-09 LAB — CBC WITH DIFFERENTIAL/PLATELET
Abs Immature Granulocytes: 0.04 10*3/uL (ref 0.00–0.07)
Basophils Absolute: 0.1 10*3/uL (ref 0.0–0.1)
Basophils Relative: 1 %
Eosinophils Absolute: 0.5 10*3/uL (ref 0.0–0.5)
Eosinophils Relative: 6 %
HCT: 45 % (ref 39.0–52.0)
Hemoglobin: 15.7 g/dL (ref 13.0–17.0)
Immature Granulocytes: 1 %
Lymphocytes Relative: 36 %
Lymphs Abs: 2.6 10*3/uL (ref 0.7–4.0)
MCH: 30.8 pg (ref 26.0–34.0)
MCHC: 34.9 g/dL (ref 30.0–36.0)
MCV: 88.2 fL (ref 80.0–100.0)
Monocytes Absolute: 0.6 10*3/uL (ref 0.1–1.0)
Monocytes Relative: 9 %
Neutro Abs: 3.5 10*3/uL (ref 1.7–7.7)
Neutrophils Relative %: 47 %
Platelets: 254 10*3/uL (ref 150–400)
RBC: 5.1 MIL/uL (ref 4.22–5.81)
RDW: 13.7 % (ref 11.5–15.5)
WBC: 7.3 10*3/uL (ref 4.0–10.5)
nRBC: 0 % (ref 0.0–0.2)

## 2022-05-09 LAB — RESP PANEL BY RT-PCR (RSV, FLU A&B, COVID)  RVPGX2
Influenza A by PCR: NEGATIVE
Influenza B by PCR: NEGATIVE
Resp Syncytial Virus by PCR: NEGATIVE
SARS Coronavirus 2 by RT PCR: NEGATIVE

## 2022-05-09 LAB — LIPASE, BLOOD: Lipase: 21 U/L (ref 11–51)

## 2022-05-09 MED ORDER — ONDANSETRON HCL 4 MG/2ML IJ SOLN
4.0000 mg | Freq: Once | INTRAMUSCULAR | Status: AC
  Administered 2022-05-09: 4 mg via INTRAVENOUS
  Filled 2022-05-09: qty 2

## 2022-05-09 MED ORDER — ALUM & MAG HYDROXIDE-SIMETH 200-200-20 MG/5ML PO SUSP
15.0000 mL | Freq: Once | ORAL | Status: AC
  Administered 2022-05-09: 15 mL via ORAL
  Filled 2022-05-09: qty 30

## 2022-05-09 MED ORDER — PANTOPRAZOLE SODIUM 20 MG PO TBEC: 20.0000 mg | DELAYED_RELEASE_TABLET | Freq: Every day | ORAL | 1 refills | Status: DC

## 2022-05-09 MED ORDER — SODIUM CHLORIDE 0.9 % IV BOLUS
1000.0000 mL | Freq: Once | INTRAVENOUS | Status: AC
  Administered 2022-05-09: 1000 mL via INTRAVENOUS

## 2022-05-09 MED ORDER — FAMOTIDINE 20 MG PO TABS
20.0000 mg | ORAL_TABLET | Freq: Once | ORAL | Status: AC
  Administered 2022-05-09: 20 mg via ORAL
  Filled 2022-05-09: qty 1

## 2022-05-09 MED ORDER — BENZONATATE 100 MG PO CAPS: 100.0000 mg | ORAL_CAPSULE | Freq: Three times a day (TID) | ORAL | 0 refills | Status: DC

## 2022-05-09 NOTE — ED Triage Notes (Signed)
Pt to er, pt states that he has a chronic cough, pt states that he is here for some epigastric pain, states that he took some thera flu yesterday and when he did he had some burning in his chest and throat.  States that he has been vomiting for the past three days.

## 2022-05-09 NOTE — ED Provider Notes (Signed)
[facility] [place] Provider Note   CSN: [id number] Arrival date & time: 05/09/22  1546     History  Chief Complaint  Patient presents with   Cough       Francisco Bates is a 57 y.o. male.   Cough   57 year old male presents emergency department with complaints of cough.  Patient states he has had this cough for the past 2 years and has been unchanged in nature.  States he was seen a provider for it.  Presents today because yesterday he had 2-3 episodes of emesis associated with coughing episodes.  Denies any known sick contact.  Reports some epigastric abdominal pain isolated to episodes of emesis with no pain in between.  Denies fever, congestion, hematemesis, urinary symptoms, change in bowel habits.  Patient states he had diagnosis of GERD in the past but has not been treated.  Denies history of tobacco use.  Past medical history significant for alcoholic gastritis, asthma, chronic alcohol use  Home Medications Prior to Admission medications   Medication Sig Start Date End Date Taking? Authorizing Provider  benzonatate (TESSALON) 100 MG capsule Take 1 capsule (100 mg total) by mouth every 8 (eight) hours. 05/09/22  Yes Dion Saucier A, PA  pantoprazole (PROTONIX) 20 MG tablet Take 1 tablet (20 mg total) by mouth daily. 05/09/22  Yes Dion Saucier A, PA  ibuprofen (ADVIL) 100 MG tablet Take 200 mg by mouth every 6 (six) hours as needed for fever.    [provider]  lidocaine (LIDODERM) 5 % Place 1 patch onto the skin daily. Remove & Discard patch within 12 hours or as directed by MD 03/03/20   Charlann Lange, PA-C  Multiple Vitamin (MULTIVITAMIN WITH MINERALS) TABS tablet Take 1 tablet by mouth daily. 10/21/14   Dhungel, Nishant, MD  ondansetron (ZOFRAN) 4 MG tablet Take 1 tablet (4 mg total) by mouth every 6 (six) hours. 03/02/20   Couture, Cortni S, PA-C      Allergies    Patient has no known allergies.    Review of Systems    Review of Systems  Respiratory:  Positive for cough.   All other systems reviewed and are negative.   Physical Exam Updated Vital Signs BP (!) 135/104 (BP Location: Right Arm)   Pulse 100   Temp 98.6 F (37 C) (Oral)   Resp 20   Ht 5' 11"$  (1.803 m)   Wt 104.3 kg   SpO2 97%   BMI 32.08 kg/m  Physical Exam Vitals and nursing note reviewed.  Constitutional:      General: He is not in acute distress.    Appearance: He is well-developed.  HENT:     Head: Normocephalic and atraumatic.  Eyes:     Conjunctiva/sclera: Conjunctivae normal.  Cardiovascular:     Rate and Rhythm: Normal rate and regular rhythm.     Heart sounds: No murmur heard. Pulmonary:     Effort: Pulmonary effort is normal. No respiratory distress.     Breath sounds: Normal breath sounds.  Abdominal:     Palpations: Abdomen is soft.     Tenderness: There is no abdominal tenderness.  Musculoskeletal:        General: No swelling.     Cervical back: Neck supple.  Skin:    General: Skin is warm and dry.     Capillary Refill: Capillary refill takes less than 2 seconds.  Neurological:     Mental Status: He is alert.  Psychiatric:        Mood and Affect: Mood normal.     ED Results / Procedures / Treatments   Labs (all labs ordered are listed, but only abnormal results are displayed) Labs Reviewed  COMPREHENSIVE METABOLIC PANEL - Abnormal; Notable for the following components:      Result Value   Sodium 132 (*)    Glucose, Bld 106 (*)    Calcium 8.5 (*)    Total Protein 8.4 (*)    Total Bilirubin 1.4 (*)    All other components within normal limits  RESP PANEL BY RT-PCR (RSV, FLU A&B, COVID)  [id number]  CBC WITH DIFFERENTIAL/PLATELET  LIPASE, BLOOD    EKG None  Radiology DG Chest Portable 1 View  Result Date: 05/09/2022 CLINICAL DATA:  Cough. EXAM: PORTABLE CHEST 1 VIEW COMPARISON:  March 02, 2020. FINDINGS: The heart size and mediastinal contours are within normal limits. Both lungs are  clear. The visualized skeletal structures are unremarkable. IMPRESSION: No active disease. Electronically Signed   By: Marijo Conception M.D.   On: 05/09/2022 16:16    Procedures Procedures    Medications Ordered in ED Medications  sodium chloride 0.9 % bolus 1,000 mL (0 mLs Intravenous Stopped 05/09/22 1816)  ondansetron (ZOFRAN) injection 4 mg (4 mg Intravenous Given 05/09/22 1646)  alum & mag hydroxide-simeth (MAALOX/MYLANTA) 200-200-20 MG/5ML suspension 15 mL (15 mLs Oral Given 05/09/22 1648)  famotidine (PEPCID) tablet 20 mg (20 mg Oral Given 05/09/22 1647)    ED Course/ Medical Decision Making/ A&P                             Medical Decision Making Amount and/or Complexity of Data Reviewed Labs: ordered. Radiology: ordered.  Risk OTC drugs. Prescription drug management.   This patient presents to the ED for concern of cough, this involves an extensive number of treatment options, and is a complaint that carries with it a high risk of complications and morbidity.  The differential diagnosis includes COVID, influenza, RSV, GERD, medication side effect, pneumonia, malignancy, postviral cough syndrome   Co morbidities that complicate the patient evaluation  See HPI   Additional history obtained:  Additional history obtained from EMR External records from outside source obtained and reviewed including hospital records   Lab Tests:  I Ordered, and personally interpreted labs.  The pertinent results include: Sodium of 132 of which is supplemented via IV fluids.  No transaminitis.  No renal dysfunction.  No leukocytosis.  No evidence of anemia.  Platelets within range.  Lipase within normal limits.   Imaging Studies ordered:  I ordered imaging studies including chest x-ray I independently visualized and interpreted imaging which showed no acute cardiopulmonary abnormalities. I agree with the radiologist interpretation   Cardiac Monitoring: / EKG:  The patient was  maintained on a cardiac monitor.  I personally viewed and interpreted the cardiac monitored which showed an underlying rhythm of: Sinus rhythm without acute ischemic changes   Consultations Obtained:  N/a   Problem List / ED Course / Critical interventions / Medication management  Cough I ordered medication including Maalox, Pepcid, Zofran, 1 L normal saline   Reevaluation of the patient after these medicines showed that the patient improved I have reviewed the patients home medicines and have made adjustments as needed   Social Determinants of Health:  Denies tobacco, illicit drug use.   Test / Admission - Considered:  Cough Vitals signs significant for  hypertension with blood pressure of 135/104.  Recommend follow-up with primary care for elevation of blood pressure.. Otherwise within normal range and stable throughout visit. Laboratory/imaging studies significant for: See above Patient with chronic cough unchanged over the past 2 years with 2-3 bouts of emesis with coughing episodes.  Doubt serious intra-abdominal pathology given lack of abdominal tenderness, afebrile nature and reassuring laboratory studies.  Patient given 1 L of normal saline for electrolyte replenishment/dehydration.  Presumed cough possibly from untreated GERD given history of GERD, worsening of cough in the early morning and late at night when lying flat.  Will treat with PPI as well as cough suppressant.  Doubt pneumonia, ACE/ARB induced, malignancy.  Close follow-up with primary care recommended for reevaluation of symptoms.  Treatment plan discussed at length with patient and he acknowledged understanding was agreeable to said plan. Worrisome signs and symptoms were discussed with the patient, and the patient acknowledged understanding to return to the ED if noticed. Patient was stable upon discharge.          Final Clinical Impression(s) / ED Diagnoses Final diagnoses:  Acute cough    Rx / DC  Orders ED Discharge Orders          Ordered    pantoprazole (PROTONIX) 20 MG tablet  Daily        05/09/22 1659    benzonatate (TESSALON) 100 MG capsule  Every 8 hours        05/09/22 1659              Wilnette Kales, [place] 05/09/22 1906    Lennice Sites, DO 05/09/22 1908

## 2022-05-09 NOTE — Discharge Instructions (Addendum)
Note the workup today was overall reassuring.  As discussed, I think the cough is most likely secondary from GERD.  I will send in cough suppressant to use as needed for cough as well as medicine to take for reflux.  Recommend follow-up with primary care for reassessment of your symptoms.  Attached is number to set up primary care.  See attached information for GERD to avoid liquids and foods that make it worse.  Please do not hesitate to return to emergency department for worrisome signs and symptoms we discussed become apparent.

## 2022-05-13 IMAGING — DX DG CHEST 1V PORT
1 series · 1 of 1 positions shown · non-contrast
Comparison: 04/20/2009

CLINICAL DATA: Cough

EXAM:
PORTABLE CHEST 1 VIEW

[chest ap]
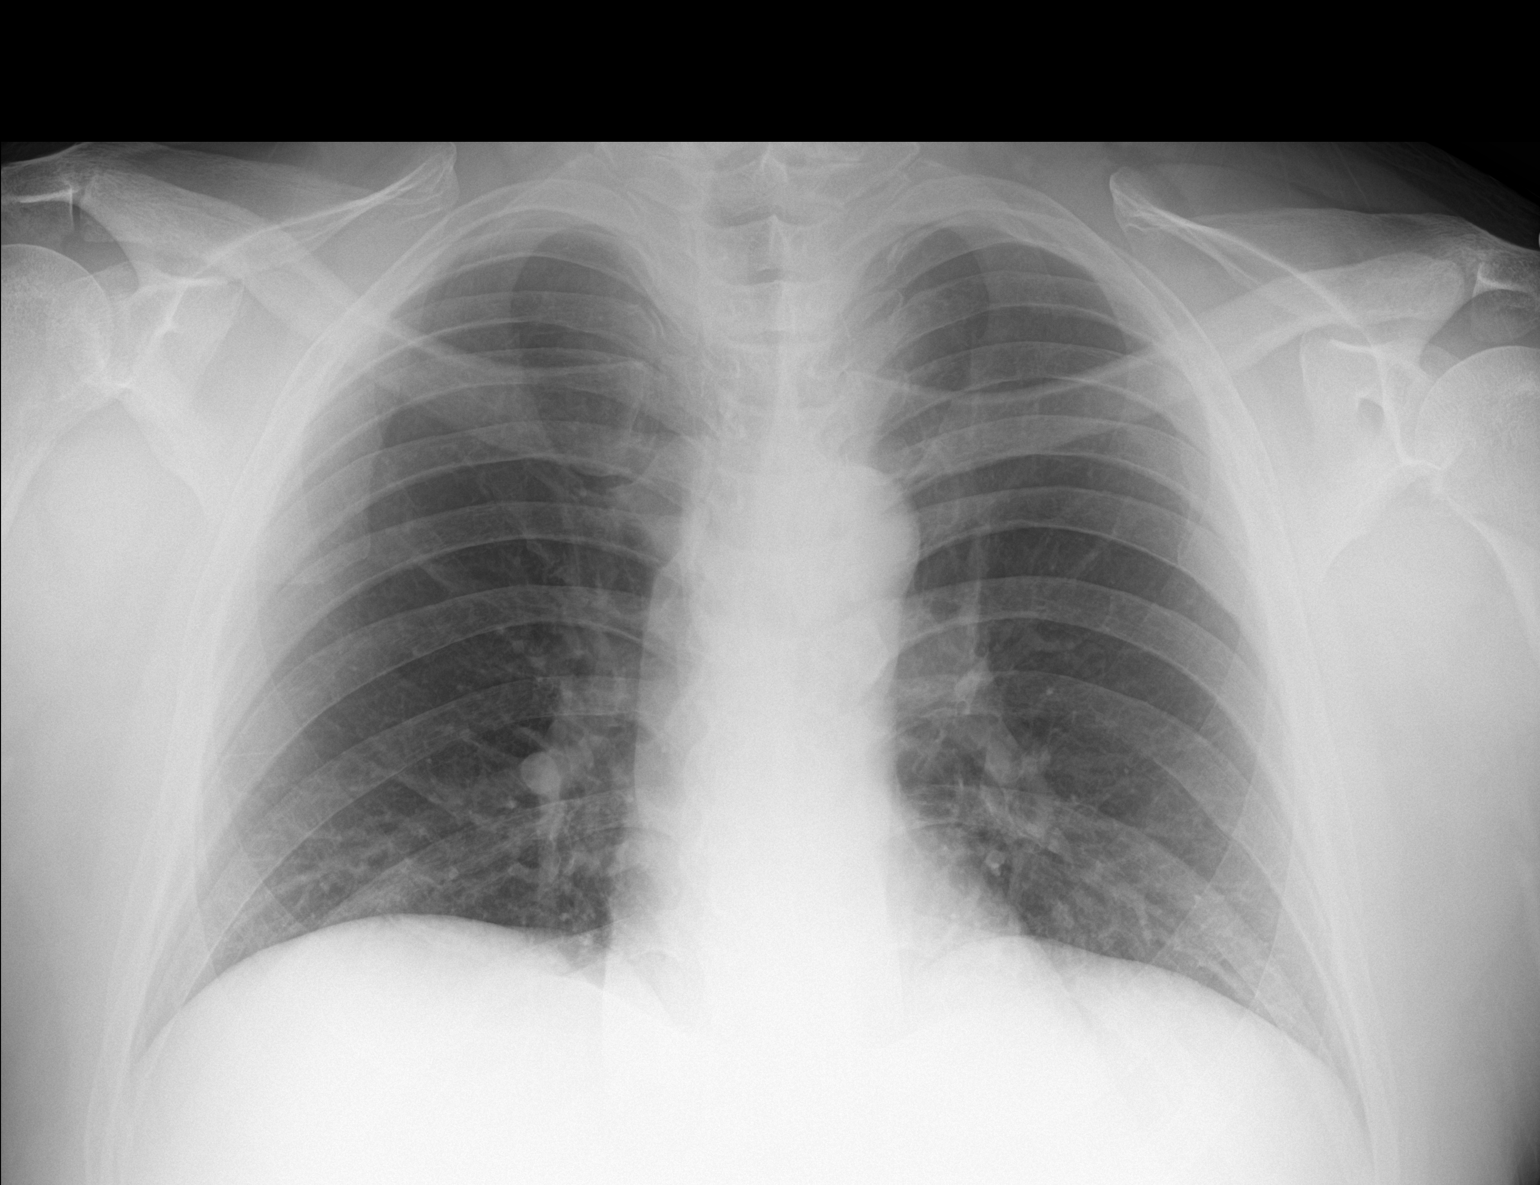

[1 of 1 positions shown; findings below may reference images not displayed]

FINDINGS: Minimal bibasilar opacities, likely atelectasis. Heart is normal
size. No effusions or acute bony abnormality.
IMPRESSION: Bibasilar atelectasis.

## 2022-05-13 IMAGING — CT CT ABD-PELV W/ CM
2 of 5 series · 17 of 46 positions shown, 19 images · IV contrast (omnipaque)
Comparison: None.

CLINICAL DATA: Abdominal cramping, pain, fever

EXAM:
CT ABDOMEN AND PELVIS WITH CONTRAST
TECHNIQUE: Multidetector CT imaging of the abdomen and pelvis was performed
using the standard protocol following bolus administration of
intravenous contrast.
CONTRAST:  100mL OMNIPAQUE IOHEXOL 300 MG/ML  SOLN

[Series 2: axial st · axial · 0.85mm/px · z∈[-478,-58]mm · 14 of 97 slices shown, 16 images]
[im 7/97  soft-tissue]
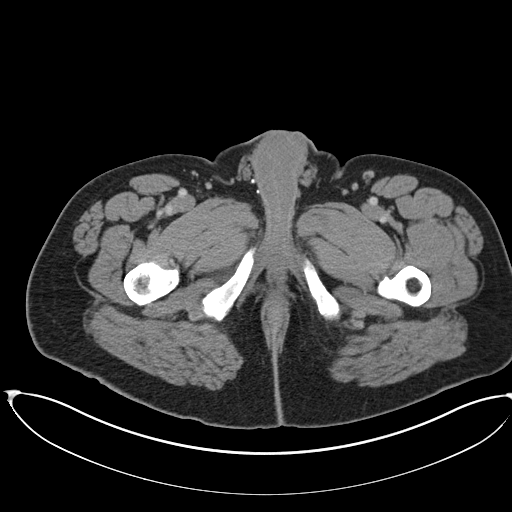
[im 7/97  bone]
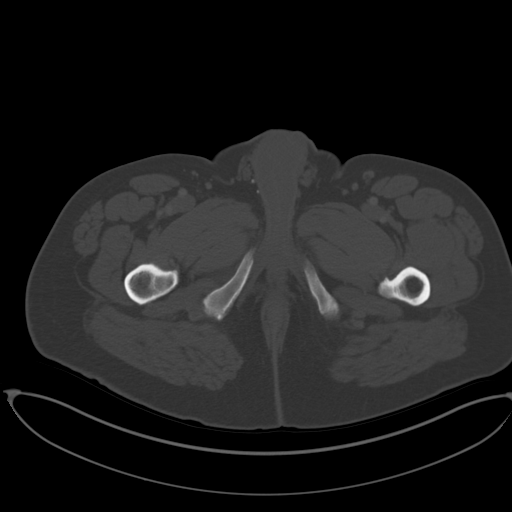
[im 13/97  soft-tissue]
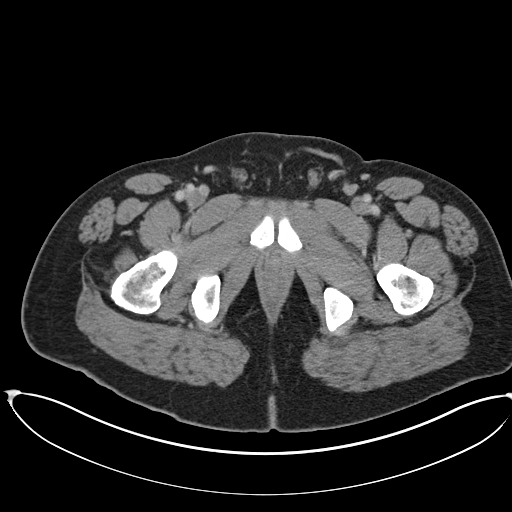
[im 19/97  soft-tissue]
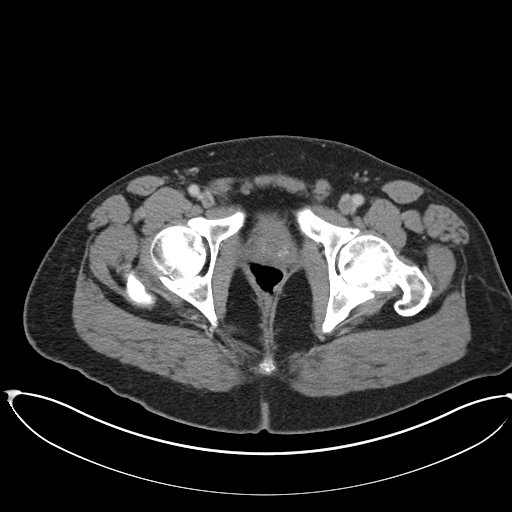
[im 25/97  soft-tissue]
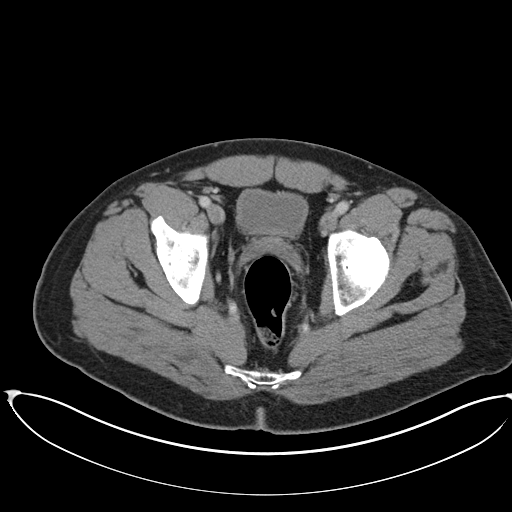
[im 31/97  soft-tissue]
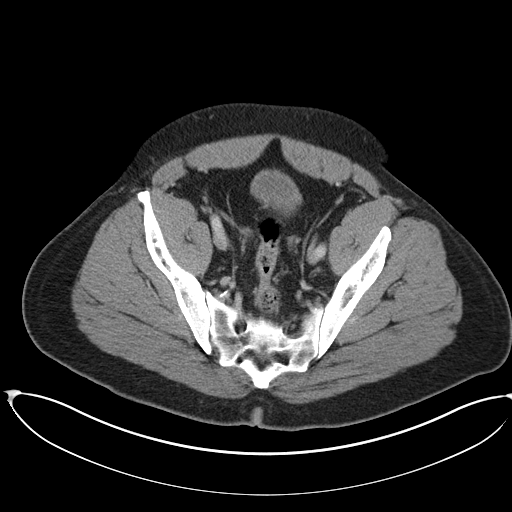
[im 37/97  soft-tissue]
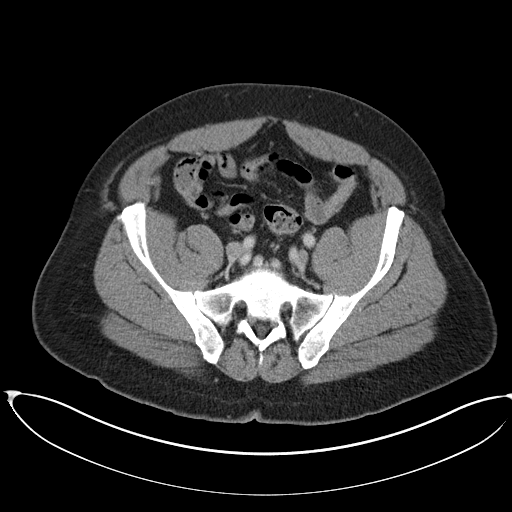
[im 43/97  soft-tissue]
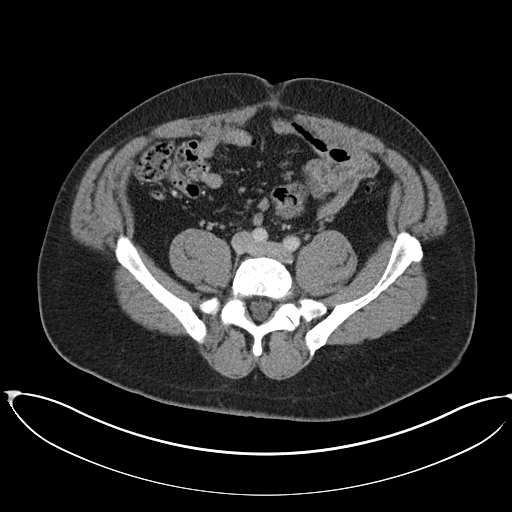
[im 55/97  soft-tissue]
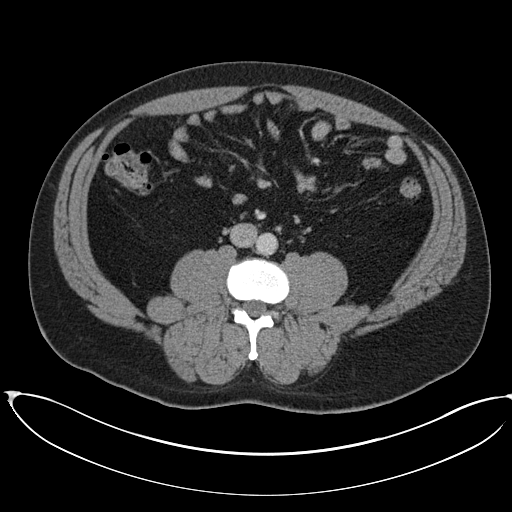
[im 61/97  soft-tissue]
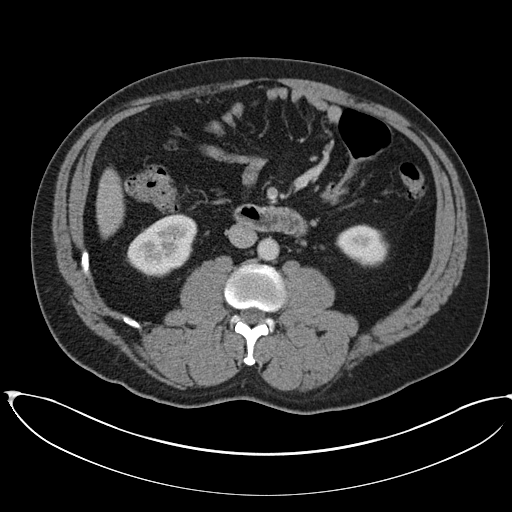
[im 61/97  bone]
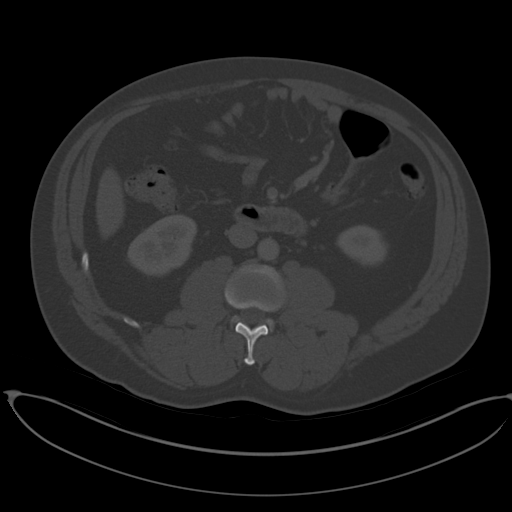
[im 67/97  soft-tissue]
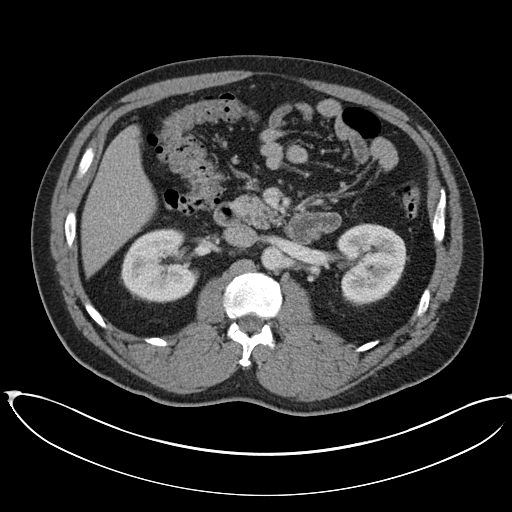
[im 73/97  soft-tissue]
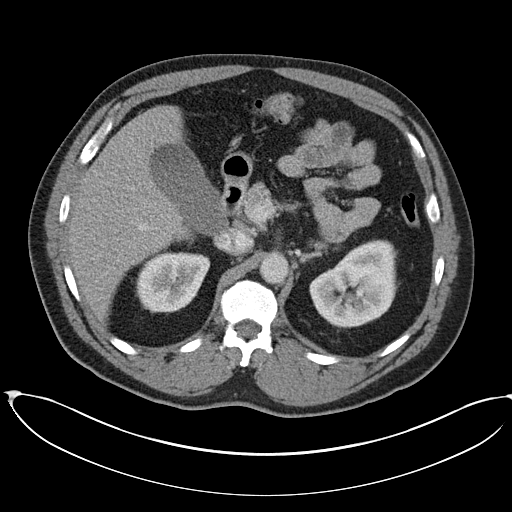
[im 79/97  soft-tissue]
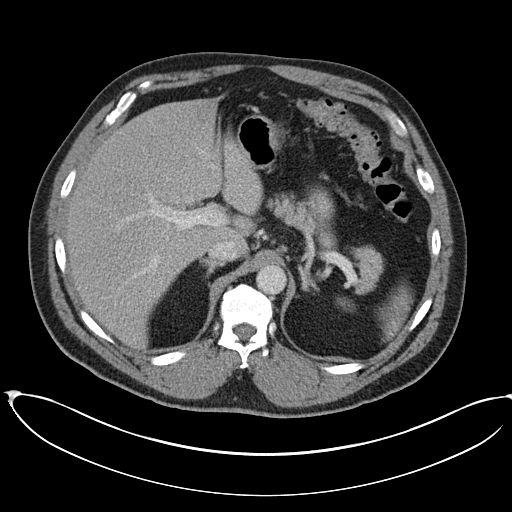
[im 85/97  soft-tissue]
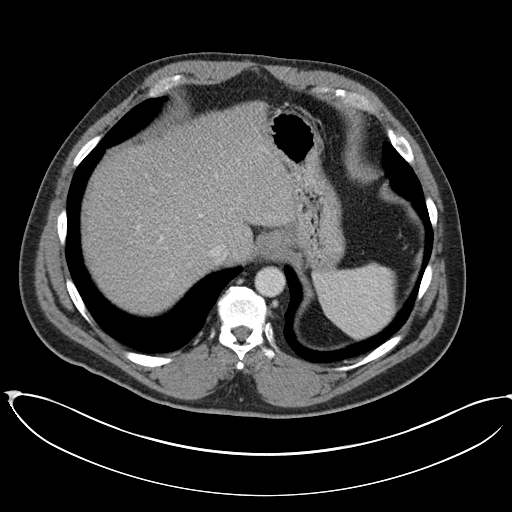
[im 91/97  soft-tissue]
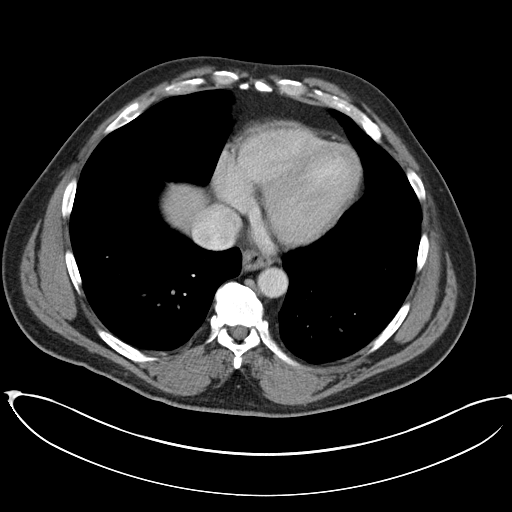

[Series 5: coronal st · coronal · 0.84mm/px · 3 of 163 slices shown]
[im 55/163  soft-tissue]
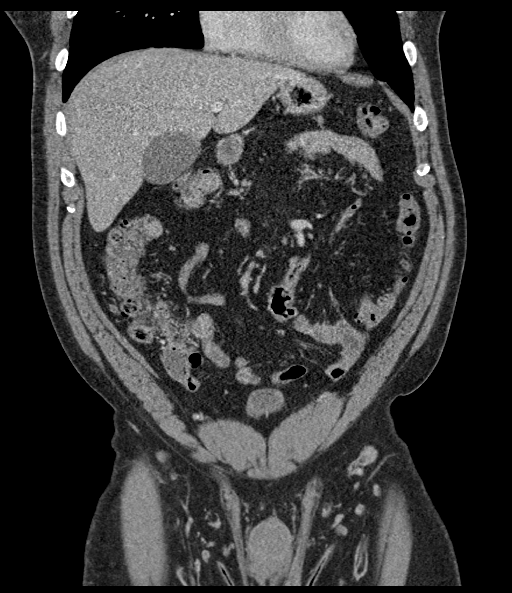
[im 73/163  soft-tissue]
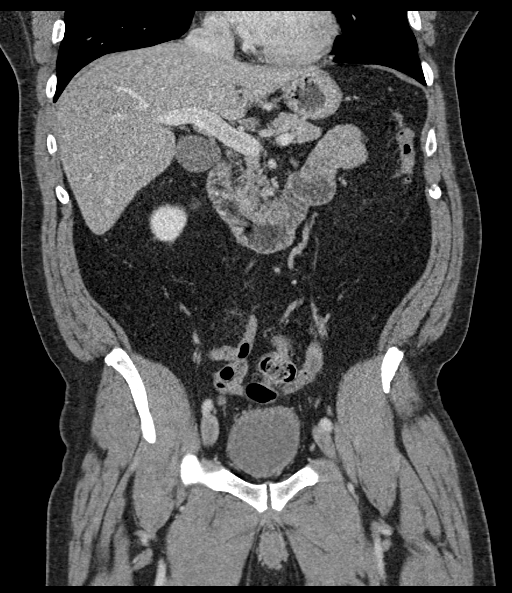
[im 91/163  soft-tissue]
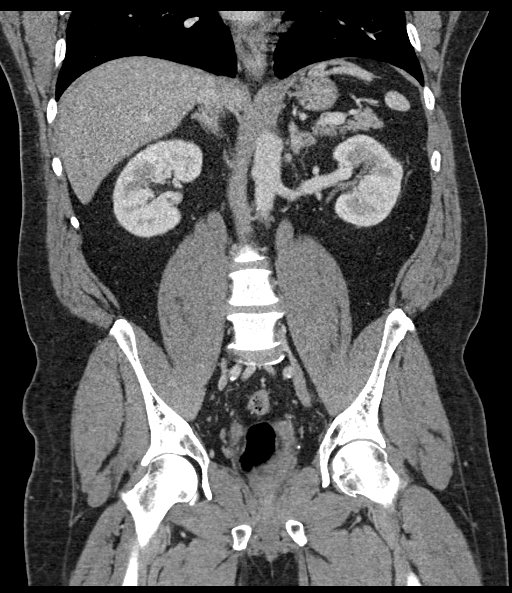

[17 of 46 positions shown; findings below may reference images not displayed]

FINDINGS: Lower chest: Lung bases are clear. No effusions. Heart is normal
size.

Hepatobiliary: Diffuse low-density throughout the liver compatible
with fatty infiltration. No focal abnormality. Gallbladder
unremarkable.

Pancreas: No focal abnormality or ductal dilatation.

Spleen: No focal abnormality.  Normal size.

Adrenals/Urinary Tract: No adrenal abnormality. No focal renal
abnormality. No stones or hydronephrosis. Urinary bladder is
unremarkable.

Stomach/Bowel: Normal appendix. Descending colonic diverticulosis.
No active diverticulitis. Stomach and small bowel decompressed,
unremarkable.

Vascular/Lymphatic: No evidence of aneurysm or adenopathy.

Reproductive: No visible focal abnormality.

Other: No free fluid or free air.

Musculoskeletal: No acute bony abnormality.
IMPRESSION: Mild hepatic steatosis.

Left colonic diverticulosis.

No acute findings.

## 2022-07-15 ENCOUNTER — Encounter (HOSPITAL_COMMUNITY): Payer: Self-pay

## 2022-07-15 ENCOUNTER — Other Ambulatory Visit: Payer: Self-pay

## 2022-07-15 ENCOUNTER — Emergency Department (HOSPITAL_COMMUNITY)
Admission: EM | Admit: 2022-07-15 | Discharge: 2022-07-16 | Disposition: A | Discharge status: Home / Self Care | Payer: Medicaid Other | Attending: Emergency Medicine | Admitting: Emergency Medicine

## 2022-07-15 ENCOUNTER — Ambulatory Visit
Admission: EM | Admit: 2022-07-15 | Discharge: 2022-07-15 | Disposition: A | Discharge status: Home / Self Care | Payer: Medicaid Other

## 2022-07-15 ENCOUNTER — Encounter: Payer: Self-pay | Admitting: Physician Assistant

## 2022-07-15 DIAGNOSIS — K920 Hematemesis: Secondary | ICD-10-CM | POA: Insufficient documentation

## 2022-07-15 DIAGNOSIS — J45909 Unspecified asthma, uncomplicated: Secondary | ICD-10-CM | POA: Insufficient documentation

## 2022-07-15 DIAGNOSIS — R638 Other symptoms and signs concerning food and fluid intake: Secondary | ICD-10-CM

## 2022-07-15 LAB — COMPREHENSIVE METABOLIC PANEL
ALT: 21 U/L (ref 0–44)
AST: 25 U/L (ref 15–41)
Albumin: 4.1 g/dL (ref 3.5–5.0)
Alkaline Phosphatase: 63 U/L (ref 38–126)
Anion gap: 10 (ref 5–15)
BUN: 12 mg/dL (ref 6–20)
CO2: 23 mmol/L (ref 22–32)
Calcium: 9 mg/dL (ref 8.9–10.3)
Chloride: 103 mmol/L (ref 98–111)
Creatinine, Ser: 0.99 mg/dL (ref 0.61–1.24)
GFR, Estimated: >60 mL/min (ref >60)
Glucose, Bld: 97 mg/dL (ref 70–99)
Potassium: 4 mmol/L (ref 3.5–5.1)
Sodium: 136 mmol/L (ref 135–145)
Total Bilirubin: 1.4 mg/dL — ABNORMAL HIGH (ref 0.3–1.2)
Total Protein: 8.6 g/dL — ABNORMAL HIGH (ref 6.5–8.1)

## 2022-07-15 LAB — LIPASE, BLOOD: Lipase: 22 U/L (ref 11–51)

## 2022-07-15 LAB — CBC WITH DIFFERENTIAL/PLATELET
Abs Immature Granulocytes: 0.05 10*3/uL (ref 0.00–0.07)
Basophils Absolute: 0.1 10*3/uL (ref 0.0–0.1)
Basophils Relative: 1 %
Eosinophils Absolute: 0.2 10*3/uL (ref 0.0–0.5)
Eosinophils Relative: 3 %
HCT: 45.7 % (ref 39.0–52.0)
Hemoglobin: 15.7 g/dL (ref 13.0–17.0)
Immature Granulocytes: 1 %
Lymphocytes Relative: 37 %
Lymphs Abs: 2.6 10*3/uL (ref 0.7–4.0)
MCH: 30.4 pg (ref 26.0–34.0)
MCHC: 34.4 g/dL (ref 30.0–36.0)
MCV: 88.4 fL (ref 80.0–100.0)
Monocytes Absolute: 0.6 10*3/uL (ref 0.1–1.0)
Monocytes Relative: 8 %
Neutro Abs: 3.5 10*3/uL (ref 1.7–7.7)
Neutrophils Relative %: 50 %
Platelets: 245 10*3/uL (ref 150–400)
RBC: 5.17 MIL/uL (ref 4.22–5.81)
RDW: 12.7 % (ref 11.5–15.5)
WBC: 7 10*3/uL (ref 4.0–10.5)
nRBC: 0 % (ref 0.0–0.2)

## 2022-07-15 MED ORDER — ONDANSETRON HCL 4 MG/2ML IJ SOLN
4.0000 mg | Freq: Once | INTRAMUSCULAR | Status: AC
  Administered 2022-07-15: 4 mg via INTRAVENOUS
  Filled 2022-07-15: qty 2

## 2022-07-15 MED ORDER — SODIUM CHLORIDE 0.9 % IV BOLUS
1000.0000 mL | Freq: Once | INTRAVENOUS | Status: AC
  Administered 2022-07-15: 1000 mL via INTRAVENOUS

## 2022-07-15 NOTE — Discharge Instructions (Signed)
I am concerned that you have had blood in your vomit and cannot keep anything down since yesterday.  Please go to the emergency room for further evaluation and management as we discussed.

## 2022-07-15 NOTE — ED Triage Notes (Signed)
Pt sts vomiting with some blood tinged vomit for 1.5 days; pt sts some diarrhea; pt sts last vomited 8 hours ago but unable to hold down POs; pt sts hx of GERD

## 2022-07-15 NOTE — ED Provider Notes (Signed)
[name]    CSN: [id number] Arrival date & time: 07/15/22  1837      History   Chief Complaint Chief Complaint  Patient presents with   Vomiting    HPI Francisco Bates is a 57 y.o. male.   Patient presents today with a 1.5 day history of nausea and vomiting.  He reports that he has had difficulty keeping anything down and last ate or drink anything approximately 8 hours ago.  He does have a history of GERD and has been taking Protonix but has not been taking it recently.  He denies any history of additional gastrointestinal disorder including peptic ulcer disease.  He does not take any blood thinning medications.  He does not take NSAIDs on a regular basis.  Reports he is not drinking alcohol regularly.  He denies any significant abdominal pain, chest pain, shortness of breath.  He reports that his emesis has had blood streaks in it prompting evaluation.  He denies history of cirrhosis or esophageal varices.    Past Medical History:  Diagnosis Date   Asthma    as child - no problems since   Patellar tendon rupture    RT knee   Rash    back   Recovering alcoholic     Patient Active Problem List   Diagnosis Date Noted   Chronic alcohol abuse    Alcohol withdrawal 10/19/2014   Alcoholic gastritis 10/19/2014   Nausea and vomiting in adult 10/19/2014   Expected blood loss anemia 11/16/2012   Obese 11/16/2012   Right patellar tendon rupture 11/15/2012   Alcohol dependence 03/25/2011    Past Surgical History:  Procedure Laterality Date   ACHILLES TENDON SURGERY     KNEE SURGERY     1995   PATELLAR TENDON REPAIR Right 11/15/2012   Procedure: OPEN REPAIR RIGHT PATELLA TENDON ;  Surgeon: [name], MD;  Location: [facility];  Service: Orthopedics;  Laterality: Right;       Home Medications    Prior to Admission medications   Medication Sig Start Date End Date Taking? Authorizing Provider  benzonatate (TESSALON) 100 MG capsule Take 1 capsule (100 mg  total) by mouth every 8 (eight) hours. 05/09/22   Peter Garter, PA  ibuprofen (ADVIL) 100 MG tablet Take 200 mg by mouth every 6 (six) hours as needed for fever.    [provider]  lidocaine (LIDODERM) 5 % Place 1 patch onto the skin daily. Remove & Discard patch within 12 hours or as directed by MD 03/03/20   Elpidio Anis, PA-C  Multiple Vitamin (MULTIVITAMIN WITH MINERALS) TABS tablet Take 1 tablet by mouth daily. 10/21/14   Dhungel, Nishant, MD  ondansetron (ZOFRAN) 4 MG tablet Take 1 tablet (4 mg total) by mouth every 6 (six) hours. 03/02/20   Couture, Cortni S, PA-C  pantoprazole (PROTONIX) 20 MG tablet Take 1 tablet (20 mg total) by mouth daily. 05/09/22   Peter Garter, PA    Family History Family History  Problem Relation Age of Onset   Hypertension Other    Cancer Mother    Cancer Father     Social History Social History   Tobacco Use   Smoking status: Never   Smokeless tobacco: Never  Vaping Use   Vaping Use: Never used  Substance Use Topics   Alcohol use: No    Comment: quit in 08/1974   Drug use: No     Allergies   Patient has no known  allergies.   Review of Systems Review of Systems  Constitutional:  Positive for activity change. Negative for appetite change, fatigue and fever.  Respiratory:  Positive for cough (Chronic). Negative for shortness of breath.   Cardiovascular:  Negative for chest pain.  Gastrointestinal:  Positive for diarrhea, nausea and vomiting. Negative for abdominal pain, blood in stool and constipation.  Neurological:  Positive for weakness (Generalized).     Physical Exam Triage Vital Signs ED Triage Vitals  Enc Vitals Group     BP 07/15/22 1916 (!) 142/100     Pulse Rate 07/15/22 1916 93     Resp 07/15/22 1916 20     Temp 07/15/22 1916 98.4 F (36.9 C)     Temp Source 07/15/22 1916 Oral     SpO2 07/15/22 1916 96 %     Weight --      Height --      Head Circumference --      Peak Flow --      Pain Score  07/15/22 1926 2     Pain Loc --      Pain Edu? --      Excl. in GC? --    No data found.  Updated Vital Signs BP (!) 142/100 (BP Location: Right Arm)   Pulse 93   Temp 98.4 F (36.9 C) (Oral)   Resp 20   SpO2 96%   Visual Acuity Right Eye Distance:   Left Eye Distance:   Bilateral Distance:    Right Eye Near:   Left Eye Near:    Bilateral Near:     Physical Exam Vitals reviewed.  Constitutional:      General: He is awake.     Appearance: Normal appearance. He is well-developed. He is not ill-appearing.     Comments: Very pleasant male appears stated age in no acute distress sitting comfortably in exam room  HENT:     Head: Normocephalic and atraumatic.     Mouth/Throat:     Pharynx: Uvula midline. No oropharyngeal exudate or posterior oropharyngeal erythema.  Cardiovascular:     Rate and Rhythm: Normal rate and regular rhythm.     Heart sounds: Normal heart sounds, S1 normal and S2 normal. No murmur heard. Pulmonary:     Effort: Pulmonary effort is normal.     Breath sounds: Normal breath sounds. No stridor. No wheezing, rhonchi or rales.     Comments: Clear to auscultation bilaterally Abdominal:     General: Bowel sounds are normal.     Palpations: Abdomen is soft.     Tenderness: There is no abdominal tenderness. There is no right CVA tenderness, left CVA tenderness, guarding or rebound.     Comments: Benign abdominal exam  Neurological:     Mental Status: He is alert.  Psychiatric:        Behavior: Behavior is cooperative.      UC Treatments / Results  Labs (all labs ordered are listed, but only abnormal results are displayed) Labs Reviewed - No data to display  EKG   Radiology No results found.  Procedures Procedures (including critical care time)  Medications Ordered in UC Medications - No data to display  Initial Impression / Assessment and Plan / UC Course  I have reviewed the triage vital signs and the nursing notes.  Pertinent labs &  imaging results that were available during my care of the patient were reviewed by me and considered in my medical decision making (see chart for details).  Patient is well-appearing, afebrile, nontoxic, nontachycardic.  Discussed that his blood-streaked vomit could just be esophageal irritation versus gastritis, however, given his history I do think it is reasonable to go to the emergency room for further evaluation and management.  Patient is agreeable and will go directly to ER for further evaluation.  He does not currently have a primary care does not follow-up with GI outpatient so we will try to establish him with the services.  Will try to establish via PCP assistance.  He was stable at time of discharge and safe for private transport.  Final Clinical Impressions(s) / UC Diagnoses   Final diagnoses:  Hematemesis with nausea  Decreased oral intake     Discharge Instructions      I am concerned that you have had blood in your vomit and cannot keep anything down since yesterday.  Please go to the emergency room for further evaluation and management as we discussed.     ED Prescriptions   None    PDMP not reviewed this encounter.   Jeani Hawking, PA-C 07/15/22 1956

## 2022-07-15 NOTE — ED Triage Notes (Signed)
Patient reports going to UC today for blood in vomit, has known GERD. Was sent here for evaluation. Hasn't been able to keep down much food since yesterday.

## 2022-07-15 NOTE — ED Notes (Signed)
Patient is being discharged from the Urgent Care and sent to the Emergency Department via POV . Per ER, patient is in need of higher level of care due to bloody emesis. Patient is aware and verbalizes understanding of plan of care.  Vitals:   07/15/22 1916  BP: (!) 142/100  Pulse: 93  Resp: 20  Temp: 98.4 F (36.9 C)  SpO2: 96%

## 2022-07-15 NOTE — ED Provider Notes (Signed)
[place] [facility] AT [facility] Provider Note   CSN: [id number] Arrival date & time: 07/15/22  2052     History  Chief Complaint  Patient presents with   Hematemesis   HPI Francisco Bates is a 57 y.o. male with history of asthma, chronic alcohol abuse presenting for hematemesis.  Nausea vomiting started 2 days ago.  Denies any sick contacts.  Noticed traces of bright red blood in his emesis x 2.  States last bowel movement was 3 days ago.  Denies fever or chills.  Denies abdominal pain.  Was seen earlier today by urgent care and was diagnosed with GERD and started on pantoprazole.  Denies urinary changes.  He presented here because his vomiting is still very persistent and he "cannot keep anything down" and is concerned he is severely dehydrated.  Patient mentioned that he used to be a "heavy drinker about 10 years ago.  Now he only drinks "a 40oz every Friday night".   HPI     Home Medications Prior to Admission medications   Medication Sig Start Date End Date Taking? Authorizing Provider  ondansetron (ZOFRAN) 4 MG tablet Take 1 tablet (4 mg total) by mouth every 6 (six) hours. 07/16/22  Yes Gareth [facility], PA-C  benzonatate (TESSALON) 100 MG capsule Take 1 capsule (100 mg total) by mouth every 8 (eight) hours. 05/09/22   Peter Garter, PA  ibuprofen (ADVIL) 100 MG tablet Take 200 mg by mouth every 6 (six) hours as needed for fever.    [provider]  lidocaine (LIDODERM) 5 % Place 1 patch onto the skin daily. Remove & Discard patch within 12 hours or as directed by MD 03/03/20   Elpidio Anis, PA-C  Multiple Vitamin (MULTIVITAMIN WITH MINERALS) TABS tablet Take 1 tablet by mouth daily. 10/21/14   Dhungel, Nishant, MD  pantoprazole (PROTONIX) 20 MG tablet Take 1 tablet (20 mg total) by mouth daily. 05/09/22   Peter Garter, PA      Allergies    Patient has no known allergies.    Review of Systems   See HPI for pertinent  positves   Physical Exam   Vitals:   07/16/22 0030 07/16/22 0100  BP: (!) 152/94 (!) 145/95  Pulse: 77 80  Resp:    Temp:    SpO2: 97% 100%    CONSTITUTIONAL:  well-appearing, NAD NEURO:  Alert and oriented x 3, CN 3-12 grossly intact EYES:  eyes equal and reactive ENT/NECK:  Supple, no stridor , no bleeding noted in posterior nasopharynx  CARDIO:  regular rate and rhythm, appears well-perfused  PULM:  No respiratory distress, CTAB GI/GU:  non-distended, soft, non tender and non distended MSK/SPINE:  No gross deformities, no edema, moves all extremities  SKIN:  no rash, atraumatic  *Additional and/or pertinent findings included in MDM below   ED Results / Procedures / Treatments   Labs (all labs ordered are listed, but only abnormal results are displayed) Labs Reviewed  COMPREHENSIVE METABOLIC PANEL - Abnormal; Notable for the following components:      Result Value   Total Protein 8.6 (*)    Total Bilirubin 1.4 (*)    All other components within normal limits  CBC WITH DIFFERENTIAL/PLATELET  LIPASE, BLOOD  URINALYSIS, ROUTINE W REFLEX MICROSCOPIC    EKG None  Radiology No results found.  Procedures Procedures    Medications Ordered in ED Medications  sodium chloride 0.9 % bolus 1,000 mL (1,000 mLs Intravenous New Bag/Given  07/15/22 2303)  ondansetron [facility]) injection 4 mg (4 mg Intravenous Given 07/15/22 2303)    ED Course/ Medical Decision Making/ A&P                             Medical Decision Making Amount and/or Complexity of Data Reviewed Labs: ordered.  Risk Prescription drug management.   57 year old well-appearing male presenting for hematemesis.  Exam was unremarkable.  DDx includes esophageal varices, portal hypertension, Mallory-Weiss tear, other intra-abdominal infection, dehydration, electrolyte derangement.  Overall patient appears clinically well.  Given his history of chronic alcohol abuse there was some concern for esophageal  varices although less likely given hemoglobin 15.7, and patient only endorses trace amounts of bright red blood and 2 occurrences of emesis along with reassuring vital signs.  Nevertheless, do feel it is appropriate for him to urgently follow-up with GI in the outpatient setting.  After volume gestation and treatment of his nausea with Zofran, the patient stated that he did feel much better.  Discharged with stable vitals.  Advised to follow-up with GI.  Discussed pertinent return precautions.  Sent Zofran to his pharmacy.        Final Clinical Impression(s) / ED Diagnoses Final diagnoses:  Hematemesis, unspecified whether nausea present    Rx / DC Orders ED Discharge Orders          Ordered    ondansetron (ZOFRAN) 4 MG tablet  Every 6 hours        07/16/22 0129              Gareth Eagle, PA-C 07/16/22 0157    Ernie Avena, MD 07/19/22 1243

## 2022-07-16 LAB — URINALYSIS, ROUTINE W REFLEX MICROSCOPIC
Bilirubin Urine: NEGATIVE
Glucose, UA: NEGATIVE mg/dL
Hgb urine dipstick: NEGATIVE
Ketones, ur: NEGATIVE mg/dL
Leukocytes,Ua: NEGATIVE
Nitrite: NEGATIVE
Protein, ur: NEGATIVE mg/dL
Specific Gravity, Urine: 1.021 (ref 1.005–1.030)
pH: 5 (ref 5.0–8.0)

## 2022-07-16 MED ORDER — ONDANSETRON HCL 4 MG PO TABS: 4.0000 mg | ORAL_TABLET | Freq: Four times a day (QID) | ORAL | 0 refills | Status: DC

## 2022-07-16 NOTE — Discharge Instructions (Addendum)
Evaluation for your nausea and vomiting is overall reassuring.  Recommend you follow-up with [facility] in the outpatient setting for the blood in your vomit.  If you have worsening blood in your vomit or stool, fatigue, lightheadedness, trouble breathing, chest pain or any other concerning symptom please return emerged part for further evaluation.

## 2022-09-18 ENCOUNTER — Inpatient Hospital Stay (HOSPITAL_BASED_OUTPATIENT_CLINIC_OR_DEPARTMENT_OTHER)
Admission: EM | Admit: 2022-09-18 | Discharge: 2022-09-23 | DRG: 392 | Disposition: A | Discharge status: Home / Self Care | Payer: BC Managed Care – PPO | Attending: Internal Medicine | Admitting: Internal Medicine

## 2022-09-18 ENCOUNTER — Other Ambulatory Visit: Payer: Self-pay

## 2022-09-18 ENCOUNTER — Encounter (HOSPITAL_BASED_OUTPATIENT_CLINIC_OR_DEPARTMENT_OTHER): Payer: Self-pay | Admitting: Emergency Medicine

## 2022-09-18 ENCOUNTER — Emergency Department (HOSPITAL_BASED_OUTPATIENT_CLINIC_OR_DEPARTMENT_OTHER): Payer: BC Managed Care – PPO

## 2022-09-18 DIAGNOSIS — R112 Nausea with vomiting, unspecified: Secondary | ICD-10-CM

## 2022-09-18 DIAGNOSIS — Z1152 Encounter for screening for COVID-19: Secondary | ICD-10-CM

## 2022-09-18 DIAGNOSIS — R109 Unspecified abdominal pain: Secondary | ICD-10-CM | POA: Diagnosis not present

## 2022-09-18 DIAGNOSIS — M6282 Rhabdomyolysis: Secondary | ICD-10-CM | POA: Diagnosis not present

## 2022-09-18 DIAGNOSIS — Z79899 Other long term (current) drug therapy: Secondary | ICD-10-CM | POA: Diagnosis not present

## 2022-09-18 DIAGNOSIS — J45909 Unspecified asthma, uncomplicated: Secondary | ICD-10-CM | POA: Diagnosis present

## 2022-09-18 DIAGNOSIS — F1020 Alcohol dependence, uncomplicated: Secondary | ICD-10-CM | POA: Diagnosis present

## 2022-09-18 DIAGNOSIS — E872 Acidosis, unspecified: Secondary | ICD-10-CM | POA: Diagnosis not present

## 2022-09-18 DIAGNOSIS — Z8249 Family history of ischemic heart disease and other diseases of the circulatory system: Secondary | ICD-10-CM

## 2022-09-18 DIAGNOSIS — K529 Noninfective gastroenteritis and colitis, unspecified: Secondary | ICD-10-CM | POA: Diagnosis not present

## 2022-09-18 DIAGNOSIS — I10 Essential (primary) hypertension: Secondary | ICD-10-CM | POA: Diagnosis present

## 2022-09-18 DIAGNOSIS — F1010 Alcohol abuse, uncomplicated: Secondary | ICD-10-CM

## 2022-09-18 DIAGNOSIS — F1021 Alcohol dependence, in remission: Secondary | ICD-10-CM | POA: Diagnosis present

## 2022-09-18 DIAGNOSIS — R000 Tachycardia, unspecified: Secondary | ICD-10-CM | POA: Diagnosis not present

## 2022-09-18 DIAGNOSIS — R059 Cough, unspecified: Secondary | ICD-10-CM | POA: Diagnosis not present

## 2022-09-18 DIAGNOSIS — N3289 Other specified disorders of bladder: Secondary | ICD-10-CM | POA: Diagnosis not present

## 2022-09-18 DIAGNOSIS — R9431 Abnormal electrocardiogram [ECG] [EKG]: Secondary | ICD-10-CM | POA: Diagnosis not present

## 2022-09-18 DIAGNOSIS — K2920 Alcoholic gastritis without bleeding: Secondary | ICD-10-CM | POA: Diagnosis not present

## 2022-09-18 LAB — CBC
HCT: 46.6 % (ref 39.0–52.0)
Hemoglobin: 15.9 g/dL (ref 13.0–17.0)
MCH: 30.2 pg (ref 26.0–34.0)
MCHC: 34.1 g/dL (ref 30.0–36.0)
MCV: 88.4 fL (ref 80.0–100.0)
Platelets: 301 10*3/uL (ref 150–400)
RBC: 5.27 MIL/uL (ref 4.22–5.81)
RDW: 14.7 % (ref 11.5–15.5)
WBC: 5.9 10*3/uL (ref 4.0–10.5)
nRBC: 0 % (ref 0.0–0.2)

## 2022-09-18 LAB — URINALYSIS, MICROSCOPIC (REFLEX)
RBC / HPF: NONE SEEN RBC/hpf (ref 0–5)
WBC, UA: NONE SEEN WBC/hpf (ref 0–5)

## 2022-09-18 LAB — SARS CORONAVIRUS 2 BY RT PCR: SARS Coronavirus 2 by RT PCR: NEGATIVE

## 2022-09-18 LAB — URINALYSIS, ROUTINE W REFLEX MICROSCOPIC
Bilirubin Urine: NEGATIVE
Glucose, UA: NEGATIVE mg/dL
Hgb urine dipstick: NEGATIVE
Ketones, ur: NEGATIVE mg/dL
Leukocytes,Ua: NEGATIVE
Nitrite: NEGATIVE
Protein, ur: 100 mg/dL — AB
Specific Gravity, Urine: 1.02 (ref 1.005–1.030)
pH: 7.5 (ref 5.0–8.0)

## 2022-09-18 LAB — COMPREHENSIVE METABOLIC PANEL
ALT: 32 U/L (ref 0–44)
AST: 57 U/L — ABNORMAL HIGH (ref 15–41)
Albumin: 4.1 g/dL (ref 3.5–5.0)
Alkaline Phosphatase: 75 U/L (ref 38–126)
Anion gap: 13 (ref 5–15)
BUN: 7 mg/dL (ref 6–20)
CO2: 23 mmol/L (ref 22–32)
Calcium: 8.7 mg/dL — ABNORMAL LOW (ref 8.9–10.3)
Chloride: 101 mmol/L (ref 98–111)
Creatinine, Ser: 1.08 mg/dL (ref 0.61–1.24)
GFR, Estimated: >60 mL/min (ref >60)
Glucose, Bld: 115 mg/dL — ABNORMAL HIGH (ref 70–99)
Potassium: 3.8 mmol/L (ref 3.5–5.1)
Sodium: 137 mmol/L (ref 135–145)
Total Bilirubin: 1 mg/dL (ref 0.3–1.2)
Total Protein: 8.7 g/dL — ABNORMAL HIGH (ref 6.5–8.1)

## 2022-09-18 LAB — LIPASE, BLOOD: Lipase: 22 U/L (ref 11–51)

## 2022-09-18 LAB — LACTIC ACID, PLASMA
Lactic Acid, Venous: 3.6 mmol/L (ref 0.5–1.9)
Lactic Acid, Venous: 3.3 mmol/L (ref 0.5–1.9)

## 2022-09-18 LAB — CK: Total CK: 535 U/L — ABNORMAL HIGH (ref 49–397)

## 2022-09-18 MED ORDER — ONDANSETRON HCL 4 MG/2ML IJ SOLN
4.0000 mg | Freq: Once | INTRAMUSCULAR | Status: AC
  Administered 2022-09-18: 4 mg via INTRAVENOUS
  Filled 2022-09-18: qty 2

## 2022-09-18 MED ORDER — ONDANSETRON HCL 4 MG/2ML IJ SOLN
4.0000 mg | Freq: Once | INTRAMUSCULAR | Status: AC | PRN
  Administered 2022-09-18: 4 mg via INTRAVENOUS
  Filled 2022-09-18: qty 2

## 2022-09-18 MED ORDER — IOHEXOL 300 MG/ML  SOLN
100.0000 mL | Freq: Once | INTRAMUSCULAR | Status: AC | PRN
  Administered 2022-09-18: 100 mL via INTRAVENOUS

## 2022-09-18 MED ORDER — SODIUM CHLORIDE 0.9 % IV SOLN: Freq: Once | INTRAVENOUS | Status: AC

## 2022-09-18 MED ORDER — FAMOTIDINE IN NACL 20-0.9 MG/50ML-% IV SOLN
20.0000 mg | Freq: Once | INTRAVENOUS | Status: AC
  Administered 2022-09-18: 20 mg via INTRAVENOUS
  Filled 2022-09-18: qty 50

## 2022-09-18 MED ORDER — SODIUM CHLORIDE 0.9 % IV BOLUS
1000.0000 mL | Freq: Once | INTRAVENOUS | Status: AC
  Administered 2022-09-18: 1000 mL via INTRAVENOUS

## 2022-09-18 NOTE — ED Triage Notes (Signed)
N/V for several days, worse today.  Chills and sweats.  No diarrhea.  Some cough.   Decreased appetite.

## 2022-09-18 NOTE — ED Provider Notes (Addendum)
[facility] Provider Note   CSN: [id number] Arrival date & time: 09/18/22  1615     History  Chief Complaint  Patient presents with   Emesis    Francisco Bates is a 57 y.o. male.  Patient is a 57 year old male with no significant past medical history presenting for complaints of vomiting.  Patient admits to decreased appetite, nausea, vomiting over the past 3 days.  No specific abdominal pain.  No constipation or diarrhea.  No hematochezia or melena.  Denies abdominal surgeries.  Denies any sick contacts.  Denies any suspicious food intake. Patient admits to drinking approximately 3-4 beers a day on the weekends.  Does not have a primary care physician.  Has not been seen by medical health professional in the past 10 years.  Patient presenting to the emergency department tachypneic, tachycardic, and hypertensive.   The history is provided by the patient. No language interpreter was used.  Emesis Associated symptoms: no abdominal pain, no arthralgias, no chills, no cough, no fever and no sore throat        Home Medications Prior to Admission medications   Medication Sig Start Date End Date Taking? Authorizing Provider  benzonatate (TESSALON) 100 MG capsule Take 1 capsule (100 mg total) by mouth every 8 (eight) hours. 05/09/22   Peter Garter, PA  ibuprofen (ADVIL) 100 MG tablet Take 200 mg by mouth every 6 (six) hours as needed for fever.    [provider]  lidocaine (LIDODERM) 5 % Place 1 patch onto the skin daily. Remove & Discard patch within 12 hours or as directed by MD 03/03/20   Elpidio Anis, PA-C  Multiple Vitamin (MULTIVITAMIN WITH MINERALS) TABS tablet Take 1 tablet by mouth daily. 10/21/14   Dhungel, Nishant, MD  ondansetron (ZOFRAN) 4 MG tablet Take 1 tablet (4 mg total) by mouth every 6 (six) hours. 07/16/22   Gareth Eagle, PA-C  pantoprazole (PROTONIX) 20 MG tablet Take 1 tablet (20 mg total) by mouth  daily. 05/09/22   Peter Garter, PA      Allergies    Patient has no known allergies.    Review of Systems   Review of Systems  Constitutional:  Positive for appetite change. Negative for chills and fever.  HENT:  Negative for ear pain and sore throat.   Eyes:  Negative for pain and visual disturbance.  Respiratory:  Negative for cough and shortness of breath.   Cardiovascular:  Negative for chest pain and palpitations.  Gastrointestinal:  Positive for nausea and vomiting. Negative for abdominal pain.  Genitourinary:  Negative for dysuria and hematuria.  Musculoskeletal:  Negative for arthralgias and back pain.  Skin:  Negative for color change and rash.  Neurological:  Negative for seizures and syncope.  All other systems reviewed and are negative.   Physical Exam Updated Vital Signs BP (!) 171/94   Pulse 79   Temp 99 F (37.2 C) (Oral)   Resp 18   Ht 5\' 11"  (1.803 m)   Wt 106.6 kg   SpO2 98%   BMI 32.78 kg/m  Physical Exam Vitals and nursing note reviewed.  Constitutional:      General: He is not in acute distress.    Appearance: He is well-developed.  HENT:     Head: Normocephalic and atraumatic.  Eyes:     Conjunctiva/sclera: Conjunctivae normal.  Cardiovascular:     Rate and Rhythm: Regular rhythm. Tachycardia present.  Heart sounds: No murmur heard. Pulmonary:     Effort: Pulmonary effort is normal. Tachypnea present. No respiratory distress.     Breath sounds: Normal breath sounds.  Abdominal:     Palpations: Abdomen is soft.     Tenderness: There is no abdominal tenderness.  Musculoskeletal:        General: No swelling.     Cervical back: Neck supple.  Skin:    General: Skin is warm and dry.     Capillary Refill: Capillary refill takes less than 2 seconds.  Neurological:     Mental Status: He is alert.  Psychiatric:        Mood and Affect: Mood normal.     ED Results / Procedures / Treatments   Labs (all labs ordered are listed, but only  abnormal results are displayed) Labs Reviewed  COMPREHENSIVE METABOLIC PANEL - Abnormal; Notable for the following components:      Result Value   Glucose, Bld 115 (*)    Calcium 8.7 (*)    Total Protein 8.7 (*)    AST 57 (*)    All other components within normal limits  URINALYSIS, ROUTINE W REFLEX MICROSCOPIC - Abnormal; Notable for the following components:   Protein, ur 100 (*)    All other components within normal limits  LACTIC ACID, PLASMA - Abnormal; Notable for the following components:   Lactic Acid, Venous 3.6 (*)    All other components within normal limits  LACTIC ACID, PLASMA - Abnormal; Notable for the following components:   Lactic Acid, Venous 3.3 (*)    All other components within normal limits  URINALYSIS, MICROSCOPIC (REFLEX) - Abnormal; Notable for the following components:   Bacteria, UA RARE (*)    All other components within normal limits  CK - Abnormal; Notable for the following components:   Total CK 535 (*)    All other components within normal limits  SARS CORONAVIRUS 2 BY RT PCR  CULTURE, BLOOD (ROUTINE X 2)  CULTURE, BLOOD (ROUTINE X 2)  LIPASE, BLOOD  CBC    EKG EKG Interpretation Date/Time:  Thursday September 18 2022 23:18:12 EDT Ventricular Rate:  78 PR Interval:  176 QRS Duration:  105 QT Interval:  441 QTC Calculation: 503 R Axis:   -26  Text Interpretation: Sinus rhythm Borderline left axis deviation Borderline T wave abnormalities Prolonged QT interval Confirmed by Edwin Dada ([id number]) on 09/18/2022 11:23:38 PM  Radiology CT ABDOMEN PELVIS W CONTRAST  Result Date: 09/18/2022 CLINICAL DATA:  Acute generalized abdominal pain, nausea, vomiting. EXAM: CT ABDOMEN AND PELVIS WITH CONTRAST TECHNIQUE: Multidetector CT imaging of the abdomen and pelvis was performed using the standard protocol following bolus administration of intravenous contrast. RADIATION DOSE REDUCTION: This exam was performed according to the departmental dose-optimization  program which includes automated exposure control, adjustment of the mA and/or kV according to patient size and/or use of iterative reconstruction technique. CONTRAST:  OMNIPAQUE IOHEXOL 300 MG/ML  SOLN COMPARISON:  March 02, 2020. FINDINGS: Lower chest: No acute abnormality. Hepatobiliary: No focal liver abnormality is seen. No gallstones, gallbladder wall thickening, or biliary dilatation. Pancreas: Unremarkable. No pancreatic ductal dilatation or surrounding inflammatory changes. Spleen: Normal in size without focal abnormality. Adrenals/Urinary Tract: Adrenal glands are unremarkable. Kidneys are normal, without renal calculi, focal lesion, or hydronephrosis. Bladder is unremarkable. Stomach/Bowel: Stomach is within normal limits. Appendix appears normal. No evidence of bowel wall thickening, distention, or inflammatory changes. Vascular/Lymphatic: No significant vascular findings are present. No enlarged abdominal or  pelvic lymph nodes. Reproductive: Prostate is unremarkable. Other: No abdominal wall hernia or abnormality. No abdominopelvic ascites. Musculoskeletal: No acute or significant osseous findings. IMPRESSION: No definite abnormality seen in the abdomen or pelvis. Electronically Signed   By: Lupita Raider M.D.   On: 09/18/2022 18:39    Procedures Procedures    Medications Ordered in ED Medications  0.9 %  sodium chloride infusion (has no administration in time range)  ondansetron (ZOFRAN) injection 4 mg (4 mg Intravenous Given 09/18/22 1744)  sodium chloride 0.9 % bolus 1,000 mL (0 mLs Intravenous Stopped 09/18/22 1927)  iohexol (OMNIPAQUE) 300 MG/ML solution 100 mL (100 mLs Intravenous Contrast Given 09/18/22 1812)  sodium chloride 0.9 % bolus 1,000 mL (0 mLs Intravenous Stopped 09/18/22 2112)  famotidine (PEPCID) IVPB 20 mg premix (0 mg Intravenous Stopped 09/18/22 2113)  ondansetron (ZOFRAN) injection 4 mg (4 mg Intravenous Given 09/18/22 2104)    ED Course/ Medical Decision  Making/ A&P                             Medical Decision Making Amount and/or Complexity of Data Reviewed Labs: ordered. Radiology: ordered.  Risk Prescription drug management. Decision regarding hospitalization.   79:19 PM  57 year old male with no significant past medical history presenting for complaints of vomiting.  Patient admits to decreased appetite, nausea, vomiting over the past 3 days.  Patient is alert and oriented x 3, no acute distress, afebrile, with tachycardia at 108 bpm, tachypnea with a rate of 21 breaths/min, and hypertensive at 171/123.  Differential diagnosis includes but is not limited to bowel obstruction, diverticulitis, pancreatitis, or any other intra-abdominal etiology for nausea and vomiting.  Vitals signs may be secondary to vomiting and dehydration versus sepsis.  IV fluids and Zofran given.  Sepsis workup ordered.  No leukocytosis.  Liver profile, lipase, and renal function stable.  UA demonstrates no UTI.  CT abdomen pelvis demonstrates no acute process.  Will hold off on antibiotics at this time due to no source of infection.  Lactic acidosis of 3 and elevated CPK of 535 concerning for dehydration.  Symptoms likely secondary to alcohol abuse.  2 L IV fluid ordered.  Maintenance fluids ordered.  Zofran given twice.   Patient recommended for admission for intractable nausea and vomiting with lactic acidosis and mild rhabdomyolysis.  Patient accepted by admitting provider Dr. Antionette Char.      Final Clinical Impression(s) / ED Diagnoses Final diagnoses:  Intractable nausea and vomiting  Alcohol abuse  Lactic acidosis  Non-traumatic rhabdomyolysis    Rx / DC Orders ED Discharge Orders     None         Franne Forts, DO 09/18/22 2336    Franne Forts, DO 09/18/22 2354

## 2022-09-18 NOTE — ED Notes (Signed)
Lab Called Critical Lactic Acid 3.6 Reported to [employer]

## 2022-09-18 NOTE — Progress Notes (Signed)
Plan of Care Note for accepted transfer   Patient: Francisco Bates MRN: [id number]   DOA: 09/18/2022  Facility requesting transfer: [facility]   Requesting Provider: Dr. Wallace Cullens   Reason for transfer: Intractable N/V   Facility course: 57 yr old man with hx of asthma and alcohol abuse p/w several days of N/V.   Lactate was 3.6 then 3.3, WBC normal, CK 535, lipase normal, EKG with QTc 503 ms, and no acute findings on CT abd/pelvis.   He was given 2 liters NS, IV Pepcid, and multiple doses of Zofran but continues to have N/V.   Plan of care: The patient is accepted for admission to Telemetry unit, at [facility].   Author: Briscoe Deutscher, MD 09/18/2022  Check [email or web address] for on-call coverage.  Nursing staff, Please call [facility] Admits & Consults System-Wide number on Amion as soon as patient's arrival, so appropriate admitting provider can evaluate the pt.

## 2022-09-19 DIAGNOSIS — K529 Noninfective gastroenteritis and colitis, unspecified: Secondary | ICD-10-CM | POA: Diagnosis present

## 2022-09-19 DIAGNOSIS — M6282 Rhabdomyolysis: Secondary | ICD-10-CM | POA: Diagnosis not present

## 2022-09-19 DIAGNOSIS — R112 Nausea with vomiting, unspecified: Secondary | ICD-10-CM | POA: Diagnosis not present

## 2022-09-19 DIAGNOSIS — Z8249 Family history of ischemic heart disease and other diseases of the circulatory system: Secondary | ICD-10-CM | POA: Diagnosis not present

## 2022-09-19 DIAGNOSIS — K2920 Alcoholic gastritis without bleeding: Secondary | ICD-10-CM | POA: Diagnosis present

## 2022-09-19 DIAGNOSIS — Z1152 Encounter for screening for COVID-19: Secondary | ICD-10-CM | POA: Diagnosis not present

## 2022-09-19 DIAGNOSIS — F1010 Alcohol abuse, uncomplicated: Secondary | ICD-10-CM | POA: Diagnosis not present

## 2022-09-19 DIAGNOSIS — I10 Essential (primary) hypertension: Secondary | ICD-10-CM | POA: Diagnosis present

## 2022-09-19 DIAGNOSIS — E872 Acidosis, unspecified: Secondary | ICD-10-CM | POA: Diagnosis present

## 2022-09-19 DIAGNOSIS — N3289 Other specified disorders of bladder: Secondary | ICD-10-CM | POA: Diagnosis present

## 2022-09-19 DIAGNOSIS — J45909 Unspecified asthma, uncomplicated: Secondary | ICD-10-CM | POA: Diagnosis present

## 2022-09-19 DIAGNOSIS — F1020 Alcohol dependence, uncomplicated: Secondary | ICD-10-CM | POA: Diagnosis present

## 2022-09-19 DIAGNOSIS — Z79899 Other long term (current) drug therapy: Secondary | ICD-10-CM | POA: Diagnosis not present

## 2022-09-19 DIAGNOSIS — F1021 Alcohol dependence, in remission: Secondary | ICD-10-CM | POA: Diagnosis present

## 2022-09-19 LAB — CBC
HCT: 41.7 % (ref 39.0–52.0)
Hemoglobin: 14.5 g/dL (ref 13.0–17.0)
MCH: 30.4 pg (ref 26.0–34.0)
MCHC: 34.8 g/dL (ref 30.0–36.0)
MCV: 87.4 fL (ref 80.0–100.0)
Platelets: 232 10*3/uL (ref 150–400)
RBC: 4.77 MIL/uL (ref 4.22–5.81)
RDW: 14 % (ref 11.5–15.5)
WBC: 6.7 10*3/uL (ref 4.0–10.5)
nRBC: 0 % (ref 0.0–0.2)

## 2022-09-19 LAB — CREATININE, SERUM
Creatinine, Ser: 0.98 mg/dL (ref 0.61–1.24)
GFR, Estimated: >60 mL/min (ref >60)

## 2022-09-19 LAB — URINALYSIS, W/ REFLEX TO CULTURE (INFECTION SUSPECTED)
Bacteria, UA: NONE SEEN
Bilirubin Urine: NEGATIVE
Glucose, UA: NEGATIVE mg/dL
Hgb urine dipstick: NEGATIVE
Ketones, ur: NEGATIVE mg/dL
Leukocytes,Ua: NEGATIVE
Nitrite: NEGATIVE
Protein, ur: NEGATIVE mg/dL
Specific Gravity, Urine: 1.01 (ref 1.005–1.030)
pH: 8 (ref 5.0–8.0)

## 2022-09-19 LAB — LACTIC ACID, PLASMA
Lactic Acid, Venous: 2.2 mmol/L (ref 0.5–1.9)
Lactic Acid, Venous: 1.5 mmol/L (ref 0.5–1.9)

## 2022-09-19 MED ORDER — FAMOTIDINE IN NACL 20-0.9 MG/50ML-% IV SOLN
20.0000 mg | INTRAVENOUS | Status: DC
  Administered 2022-09-19 – 2022-09-21 (×3): 20 mg via INTRAVENOUS
  Filled 2022-09-19 (×4): qty 50

## 2022-09-19 MED ORDER — HYDRALAZINE HCL 25 MG PO TABS
25.0000 mg | ORAL_TABLET | Freq: Three times a day (TID) | ORAL | Status: DC | PRN
  Administered 2022-09-21: 25 mg via ORAL
  Filled 2022-09-19: qty 1

## 2022-09-19 MED ORDER — AMLODIPINE BESYLATE 5 MG PO TABS
5.0000 mg | ORAL_TABLET | Freq: Every day | ORAL | Status: DC
  Administered 2022-09-19 – 2022-09-20 (×2): 5 mg via ORAL
  Filled 2022-09-19 (×2): qty 1

## 2022-09-19 MED ORDER — LACTATED RINGERS IV BOLUS
1000.0000 mL | Freq: Once | INTRAVENOUS | Status: AC
  Administered 2022-09-19: 1000 mL via INTRAVENOUS

## 2022-09-19 MED ORDER — POTASSIUM CHLORIDE IN NACL 20-0.9 MEQ/L-% IV SOLN
INTRAVENOUS | Status: DC
  Filled 2022-09-19 (×3): qty 1000

## 2022-09-19 MED ORDER — PNEUMOCOCCAL 20-VAL CONJ VACC 0.5 ML IM SUSY
0.5000 mL | PREFILLED_SYRINGE | INTRAMUSCULAR | Status: DC
  Filled 2022-09-19: qty 0.5

## 2022-09-19 MED ORDER — PHENAZOPYRIDINE HCL 100 MG PO TABS
100.0000 mg | ORAL_TABLET | Freq: Three times a day (TID) | ORAL | Status: DC
  Administered 2022-09-19 – 2022-09-22 (×8): 100 mg via ORAL
  Filled 2022-09-19 (×9): qty 1

## 2022-09-19 MED ORDER — ONDANSETRON HCL 4 MG/2ML IJ SOLN
4.0000 mg | Freq: Four times a day (QID) | INTRAMUSCULAR | Status: DC | PRN
  Administered 2022-09-19: 4 mg via INTRAVENOUS
  Filled 2022-09-19: qty 2

## 2022-09-19 MED ORDER — ENOXAPARIN SODIUM 40 MG/0.4ML IJ SOSY
40.0000 mg | PREFILLED_SYRINGE | INTRAMUSCULAR | Status: DC
  Administered 2022-09-19 – 2022-09-22 (×4): 40 mg via SUBCUTANEOUS
  Filled 2022-09-19 (×4): qty 0.4

## 2022-09-19 NOTE — ED Notes (Signed)
[facility] called @ 01:19am

## 2022-09-19 NOTE — H&P (Addendum)
PCP:   Default, Provider, MD   Chief Complaint:  Nausea and vomiting  HPI: This is a 57 year old male with past medical history of alcohol dependence.  Last few days developed nausea and vomiting, he can keep anything down.  He endorses abdominal cramps, lightheadedness and dizziness.  He denies hematemesis.  He denies diarrhea in the BM in the last few days.  He states his urine output has been normal.  Per patient he drinks 324 ounce cans of beers on the weekends, total of 6.  He went to urgent care where his lactic acid was persistently elevated.  CT abdomen pelvis was unrevealing. Patient transferred to [facility].  Last emesis approximately 12 hours ago.  Review of Systems:  Per HPI  Past Medical History: Past Medical History:  Diagnosis Date   Asthma    as child - no problems since   Patellar tendon rupture    RT knee   Rash    back   Recovering alcoholic (HCC)    Past Surgical History:  Procedure Laterality Date   ACHILLES TENDON SURGERY     KNEE SURGERY     1995   PATELLAR TENDON REPAIR Right 11/15/2012   Procedure: OPEN REPAIR RIGHT PATELLA TENDON ;  Surgeon: [name], MD;  Location: [facility]S;  Service: Orthopedics;  Laterality: Right;    Medications: Prior to Admission medications   Medication Sig Start Date End Date Taking? Authorizing Provider  benzonatate (TESSALON) 100 MG capsule Take 1 capsule (100 mg total) by mouth every 8 (eight) hours. 05/09/22   Peter Garter, PA  ibuprofen (ADVIL) 100 MG tablet Take 200 mg by mouth every 6 (six) hours as needed for fever.    [provider]  lidocaine (LIDODERM) 5 % Place 1 patch onto the skin daily. Remove & Discard patch within 12 hours or as directed by MD 03/03/20   Elpidio Anis, PA-C  Multiple Vitamin (MULTIVITAMIN WITH MINERALS) TABS tablet Take 1 tablet by mouth daily. 10/21/14   Dhungel, Nishant, MD  ondansetron (ZOFRAN) 4 MG tablet Take 1 tablet (4 mg total) by mouth every 6 (six) hours. 07/16/22    Gareth Eagle, PA-C  pantoprazole (PROTONIX) 20 MG tablet Take 1 tablet (20 mg total) by mouth daily. 05/09/22   Peter Garter, PA    Allergies:  No Known Allergies  Social History:  reports that he has never smoked. He has never used smokeless tobacco. He reports that he does not drink alcohol and does not use drugs.  Family History: Family History  Problem Relation Age of Onset   Hypertension Other    Cancer Mother    Cancer Father     Physical Exam: Vitals:   09/19/22 0230 09/19/22 0300 09/19/22 0339 09/19/22 0423  BP: (!) 178/94 (!) 181/92  (!) 168/96  Pulse: 64 72  74  Resp: 20 16  18   Temp: 98 F (36.7 C)  98 F (36.7 C) 98 F (36.7 C)  TempSrc: Oral  Oral   SpO2: 99% 97%  93%  Weight:      Height:        General:  Alert and oriented times three, well developed and nourished, no acute distress Eyes: PERRLA, pink conjunctiva, no scleral icterus ENT: Moist oral mucosa, neck supple, no thyromegaly Lungs: clear to ascultation, no wheeze, no crackles, no use of accessory muscles Cardiovascular: regular rate and rhythm, no regurgitation, no gallops, no murmurs. No carotid bruits, no JVD Abdomen: soft, positive  BS, non-tender, non-distended, no organomegaly, not an acute abdomen GU: not examined Neuro: CN II - XII grossly intact, sensation intact Musculoskeletal: strength 5/5 all extremities, no clubbing, cyanosis or edema Skin: no rash, no subcutaneous crepitation, no decubitus Psych: appropriate patient   Labs on Admission:  Recent Labs    09/18/22 1706  NA 137  K 3.8  CL 101  CO2 23  GLUCOSE 115*  BUN 7  CREATININE 1.08  CALCIUM 8.7*   Recent Labs    09/18/22 1706  AST 57*  ALT 32  ALKPHOS 75  BILITOT 1.0  PROT 8.7*  ALBUMIN 4.1   Recent Labs    09/18/22 1706  LIPASE 22   Recent Labs    09/18/22 1706  WBC 5.9  HGB 15.9  HCT 46.6  MCV 88.4  PLT 301   Recent Labs    09/18/22 1706  CKTOTAL 535*    Micro Results: Recent  Results (from the past 240 hour(s))  SARS Coronavirus 2 by RT PCR (hospital order, performed in [facility] hospital lab) *cepheid single result test* Anterior Nasal Swab     Status: None   Collection Time: 09/18/22  4:31 PM   Specimen: Anterior Nasal Swab  Result Value Ref Range Status   SARS Coronavirus 2 by RT PCR NEGATIVE NEGATIVE Final    Comment: (NOTE) SARS-CoV-2 target nucleic acids are NOT DETECTED.  The SARS-CoV-2 RNA is generally detectable in upper and lower respiratory specimens during the acute phase of infection. The lowest concentration of SARS-CoV-2 viral copies this assay can detect is 250 copies / mL. A negative result does not preclude SARS-CoV-2 infection and should not be used as the sole basis for treatment or other patient management decisions.  A negative result may occur with improper specimen collection / handling, submission of specimen other than nasopharyngeal swab, presence of viral mutation(s) within the areas targeted by this assay, and inadequate number of viral copies (<250 copies / mL). A negative result must be combined with clinical observations, patient history, and epidemiological information.  Fact Sheet for Patients:   RoadLapTop.co.za  Fact Sheet for Healthcare Providers: http://kim-miller.com/  This test is not yet approved or  cleared by the [place] FDA and has been authorized for detection and/or diagnosis of SARS-CoV-2 by FDA under an Emergency Use Authorization (EUA).  This EUA will remain in effect (meaning this test can be used) for the duration of the COVID-19 declaration under Section 564(b)(1) of the Act, 21 U.S.C. section 360bbb-3(b)(1), unless the authorization is terminated or revoked sooner.  Performed at [facility], [address] [place], [place] [address]      Radiological Exams on Admission: CT ABDOMEN PELVIS W CONTRAST  Result Date:  09/18/2022 CLINICAL DATA:  Acute generalized abdominal pain, nausea, vomiting. EXAM: CT ABDOMEN AND PELVIS WITH CONTRAST TECHNIQUE: Multidetector CT imaging of the abdomen and pelvis was performed using the standard protocol following bolus administration of intravenous contrast. RADIATION DOSE REDUCTION: This exam was performed according to the departmental dose-optimization program which includes automated exposure control, adjustment of the mA and/or kV according to patient size and/or use of iterative reconstruction technique. CONTRAST:  OMNIPAQUE IOHEXOL 300 MG/ML  SOLN COMPARISON:  March 02, 2020. FINDINGS: Lower chest: No acute abnormality. Hepatobiliary: No focal liver abnormality is seen. No gallstones, gallbladder wall thickening, or biliary dilatation. Pancreas: Unremarkable. No pancreatic ductal dilatation or surrounding inflammatory changes. Spleen: Normal in size without focal abnormality. Adrenals/Urinary Tract: Adrenal glands are unremarkable.  Kidneys are normal, without renal calculi, focal lesion, or hydronephrosis. Bladder is unremarkable. Stomach/Bowel: Stomach is within normal limits. Appendix appears normal. No evidence of bowel wall thickening, distention, or inflammatory changes. Vascular/Lymphatic: No significant vascular findings are present. No enlarged abdominal or pelvic lymph nodes. Reproductive: Prostate is unremarkable. Other: No abdominal wall hernia or abnormality. No abdominopelvic ascites. Musculoskeletal: No acute or significant osseous findings. IMPRESSION: No definite abnormality seen in the abdomen or pelvis. Electronically Signed   By: Lupita Raider M.D.   On: 09/18/2022 18:39    Assessment/Plan Present on Admission:  Lactic acidosis/intractable nausea and vomiting -Zofran as needed -IV fluid hydration -Protonix IV -Clear liquid diet, advance as tolerated to regular diet -IV Protonix -Follow lactic acid curve until normalized -CK mildly elevated at 535.   Repeat CK in a.m.   Chronic alcohol abuse -CIWA initiated   Hypertension -Patient denies prior history of.  Will monitor..  Blood pressure medications ordered  Mackenzy Eisenberg 09/19/2022, 4:27 AM

## 2022-09-19 NOTE — ED Notes (Addendum)
ED TO INPATIENT HANDOFF REPORT  ED Nurse Name and Phone #: Jesse Sans [phone]  S Name/Age/Gender Francisco Bates 57 y.o. male Room/Bed: MH06/MH06  Code Status   Code Status: Prior  Home/SNF/Other Home Patient oriented to: self, place, time, and situation Is this baseline? Yes   Triage Complete: Triage complete  Chief Complaint Intractable nausea and vomiting [R11.2]  Triage Note N/V for several days, worse today.  Chills and sweats.  No diarrhea.  Some cough.   Decreased appetite.    Allergies No Known Allergies  Level of Care/Admitting Diagnosis ED Disposition     ED Disposition  Admit   Condition  --   Comment  Hospital Area: [facility] [[id number]]  Level of Care: Telemetry [5]  Admit to tele based on following criteria: Monitor QTC interval  Interfacility transfer: Yes  May place patient in observation at [facility] or [facility] if equivalent level of care is available:: Yes  Covid Evaluation: Asymptomatic - no recent exposure (last 10 days) testing not required  Diagnosis: Intractable nausea and vomiting [720114]  Admitting Physician: Briscoe Deutscher [[id number]]  Attending Physician: Briscoe Deutscher [[id number]]          B Medical/Surgery History Past Medical History:  Diagnosis Date   Asthma    as child - no problems since   Patellar tendon rupture    RT knee   Rash    back   Recovering alcoholic (HCC)    Past Surgical History:  Procedure Laterality Date   ACHILLES TENDON SURGERY     KNEE SURGERY     1995   PATELLAR TENDON REPAIR Right 11/15/2012   Procedure: OPEN REPAIR RIGHT PATELLA TENDON ;  Surgeon: [name], MD;  Location: [facility];  Service: Orthopedics;  Laterality: Right;     A IV Location/Drains/Wounds Patient Lines/Drains/Airways Status     Active Line/Drains/Airways     Name Placement date Placement time Site Days   Peripheral IV 09/18/22 20 G Anterior;Distal;Right;Upper Arm 09/18/22  1716  Arm  1             Intake/Output Last 24 hours  Intake/Output Summary (Last 24 hours) at 09/19/2022 0145 Last data filed at 09/18/2022 2113 Gross per 24 hour  Intake 2144.91 ml  Output --  Net 2144.91 ml    Labs/Imaging Results for orders placed or performed during the hospital encounter of 09/18/22 (from the past 48 hour(s))  SARS Coronavirus 2 by RT PCR (hospital order, performed in [facility] hospital lab) *cepheid single result test* Anterior Nasal Swab     Status: None   Collection Time: 09/18/22  4:31 PM   Specimen: Anterior Nasal Swab  Result Value Ref Range   SARS Coronavirus 2 by RT PCR NEGATIVE NEGATIVE    Comment: (NOTE) SARS-CoV-2 target nucleic acids are NOT DETECTED.  The SARS-CoV-2 RNA is generally detectable in upper and lower respiratory specimens during the acute phase of infection. The lowest concentration of SARS-CoV-2 viral copies this assay can detect is 250 copies / mL. A negative result does not preclude SARS-CoV-2 infection and should not be used as the sole basis for treatment or other patient management decisions.  A negative result may occur with improper specimen collection / handling, submission of specimen other than nasopharyngeal swab, presence of viral mutation(s) within the areas targeted by this assay, and inadequate number of viral copies (<250 copies / mL). A negative result must be combined with clinical observations, patient history,  and epidemiological information.  Fact Sheet for Patients:   RoadLapTop.co.za  Fact Sheet for Healthcare Providers: http://kim-miller.com/  This test is not yet approved or  cleared by the [place] FDA and has been authorized for detection and/or diagnosis of SARS-CoV-2 by FDA under an Emergency Use Authorization (EUA).  This EUA will remain in effect (meaning this test can be used) for the duration of the COVID-19 declaration under Section 564(b)(1) of the Act, 21  U.S.C. section 360bbb-3(b)(1), unless the authorization is terminated or revoked sooner.  Performed at [facility], [address] [place], [place] [address]   Lipase, blood     Status: None   Collection Time: 09/18/22  5:06 PM  Result Value Ref Range   Lipase 22 11 - 51 U/L    Comment: Performed at [facility], [address] [place], [place] [address]  Comprehensive metabolic panel     Status: Abnormal   Collection Time: 09/18/22  5:06 PM  Result Value Ref Range   Sodium 137 135 - 145 mmol/L   Potassium 3.8 3.5 - 5.1 mmol/L   Chloride 101 98 - 111 mmol/L   CO2 23 22 - 32 mmol/L   Glucose, Bld 115 (H) 70 - 99 mg/dL    Comment: Glucose reference range applies only to samples taken after fasting for at least 8 hours.   BUN 7 6 - 20 mg/dL   Creatinine, Ser 8.29 0.61 - 1.24 mg/dL   Calcium 8.7 (L) 8.9 - 10.3 mg/dL   Total Protein 8.7 (H) 6.5 - 8.1 g/dL   Albumin 4.1 3.5 - 5.0 g/dL   AST 57 (H) 15 - 41 U/L   ALT 32 0 - 44 U/L   Alkaline Phosphatase 75 38 - 126 U/L   Total Bilirubin 1.0 0.3 - 1.2 mg/dL   GFR, Estimated >56 >21 mL/min    Comment: (NOTE) Calculated using the CKD-EPI Creatinine Equation (2021)    Anion gap 13 5 - 15    Comment: Performed at [facility], [address] [place], [place] [address]  CBC     Status: None   Collection Time: 09/18/22  5:06 PM  Result Value Ref Range   WBC 5.9 4.0 - 10.5 K/uL   RBC 5.27 4.22 - 5.81 MIL/uL   Hemoglobin 15.9 13.0 - 17.0 g/dL   HCT 78.4 69.6 - 29.5 %   MCV 88.4 80.0 - 100.0 fL   MCH 30.2 26.0 - 34.0 pg   MCHC 34.1 30.0 - 36.0 g/dL   RDW 28.4 13.2 - 44.0 %   Platelets 301 150 - 400 K/uL   nRBC 0.0 0.0 - 0.2 %    Comment: Performed at [facility], [address] [place], [place] [address]  Urinalysis, Routine w reflex microscopic -Urine, Clean Catch     Status: Abnormal   Collection Time: 09/18/22  5:06 PM  Result Value Ref Range   Color, Urine YELLOW YELLOW    APPearance CLEAR CLEAR   Specific Gravity, Urine 1.020 1.005 - 1.030   pH 7.5 5.0 - 8.0   Glucose, UA NEGATIVE NEGATIVE mg/dL   Hgb urine dipstick NEGATIVE NEGATIVE   Bilirubin Urine NEGATIVE NEGATIVE   Ketones, ur NEGATIVE NEGATIVE mg/dL   Protein, ur 536 (A) NEGATIVE mg/dL   Nitrite NEGATIVE NEGATIVE   Leukocytes,Ua NEGATIVE NEGATIVE    Comment: Performed at [facility], [address] [place], [place]  [address]  Lactic acid, plasma     Status: Abnormal   Collection Time: 09/18/22  5:06 PM  Result Value Ref Range   Lactic Acid, Venous 3.6 (HH) 0.5 - 1.9 mmol/L    Comment: CRITICAL RESULT CALLED TO, READ BACK BY AND VERIFIED WITH MISTY DAVIS @ 1835 09/18/2022 PERRY,J. Performed at [facility], [address] [place], [place] [address]   Urinalysis, Microscopic (reflex)     Status: Abnormal   Collection Time: 09/18/22  5:06 PM  Result Value Ref Range   RBC / HPF NONE SEEN 0 - 5 RBC/hpf   WBC, UA NONE SEEN 0 - 5 WBC/hpf   Bacteria, UA RARE (A) NONE SEEN   Squamous Epithelial / HPF 0-5 0 - 5 /HPF    Comment: Performed at [facility], [address] [place], [place] [address]  CK     Status: Abnormal   Collection Time: 09/18/22  5:06 PM  Result Value Ref Range   Total CK 535 (H) 49 - 397 U/L    Comment: Performed at [facility], [address] [place], [place] [address]  Lactic acid, plasma     Status: Abnormal   Collection Time: 09/18/22  7:42 PM  Result Value Ref Range   Lactic Acid, Venous 3.3 (HH) 0.5 - 1.9 mmol/L    Comment: CRITICAL VALUE NOTED. VALUE IS CONSISTENT WITH PREVIOUSLY REPORTED/CALLED VALUE Performed at [facility], [address] [place], [place] [address]    CT ABDOMEN PELVIS W CONTRAST  Result Date: 09/18/2022 CLINICAL DATA:  Acute generalized abdominal pain, nausea, vomiting. EXAM: CT ABDOMEN AND PELVIS WITH CONTRAST TECHNIQUE: Multidetector CT imaging of the abdomen and pelvis was performed  using the standard protocol following bolus administration of intravenous contrast. RADIATION DOSE REDUCTION: This exam was performed according to the departmental dose-optimization program which includes automated exposure control, adjustment of the mA and/or kV according to patient size and/or use of iterative reconstruction technique. CONTRAST:  OMNIPAQUE IOHEXOL 300 MG/ML  SOLN COMPARISON:  March 02, 2020. FINDINGS: Lower chest: No acute abnormality. Hepatobiliary: No focal liver abnormality is seen. No gallstones, gallbladder wall thickening, or biliary dilatation. Pancreas: Unremarkable. No pancreatic ductal dilatation or surrounding inflammatory changes. Spleen: Normal in size without focal abnormality. Adrenals/Urinary Tract: Adrenal glands are unremarkable. Kidneys are normal, without renal calculi, focal lesion, or hydronephrosis. Bladder is unremarkable. Stomach/Bowel: Stomach is within normal limits. Appendix appears normal. No evidence of bowel wall thickening, distention, or inflammatory changes. Vascular/Lymphatic: No significant vascular findings are present. No enlarged abdominal or pelvic lymph nodes. Reproductive: Prostate is unremarkable. Other: No abdominal wall hernia or abnormality. No abdominopelvic ascites. Musculoskeletal: No acute or significant osseous findings. IMPRESSION: No definite abnormality seen in the abdomen or pelvis. Electronically Signed   By: Lupita Raider M.D.   On: 09/18/2022 18:39    Pending Labs Unresulted Labs (From admission, onward)     Start     Ordered   09/18/22 1651  Blood culture (routine x 2)  BLOOD CULTURE X 2,   STAT      09/18/22 1650            Vitals/Pain Today's Vitals   09/18/22 2029 09/18/22 2030 09/18/22 2130 09/19/22 0036  BP:  (!) 173/96 (!) 171/94   Pulse:  68 79   Resp:   18   Temp: 99 F (37.2 C)   98 F (36.7 C)  TempSrc: Oral  Oral  SpO2:  97% 98%   Weight:      Height:      PainSc:        Isolation  Precautions No active isolations  Medications Medications  ondansetron (ZOFRAN) injection 4 mg (4 mg Intravenous Given 09/18/22 1744)  sodium chloride 0.9 % bolus 1,000 mL (0 mLs Intravenous Stopped 09/18/22 1927)  iohexol (OMNIPAQUE) 300 MG/ML solution 100 mL (100 mLs Intravenous Contrast Given 09/18/22 1812)  sodium chloride 0.9 % bolus 1,000 mL (0 mLs Intravenous Stopped 09/18/22 2112)  famotidine (PEPCID) IVPB 20 mg premix (0 mg Intravenous Stopped 09/18/22 2113)  ondansetron (ZOFRAN) injection 4 mg (4 mg Intravenous Given 09/18/22 2104)  0.9 %  sodium chloride infusion ( Intravenous New Bag/Given 09/18/22 2336)    Mobility walks     Focused Assessments GI Handoff:  Lung sounds:   O2 Device: Room Air       R Recommendations: See Admitting Provider Note  Report given to:   Additional Notes: Pt has not vomited since he has been here - more Dry heaves.  Ambulates to restroom.

## 2022-09-19 NOTE — Progress Notes (Signed)
[facility]                                                                               Francisco Bates, is a 57 y.o. male, DOB - 18-Aug-1965, ZOX:[id number] Admit date - 09/18/2022    Outpatient Primary MD for the patient is Default, Provider, MD  LOS - 0  days    Brief summary   57 year old male with past medical history of alcohol dependence. Last few days developed nausea and vomiting, he can keep anything down. He endorses abdominal cramps, lightheadedness and dizziness. He denies hematemesis. CT abd and pelvis unremarkable.    Assessment & Plan    Assessment and Plan:   Lactic acidosis from intractable nausea, vomiting  from acute gastroenteritis  Mild to mod intermittent abd pain.  Symptomatic management with IV zofran, IV fluids and clear liquid diet .  Advance diet as tolerated.   Chronic alcohol abuse No withdrawals today.    Hypertension BP parameters are elevated.  Will add norvasc.    Estimated body mass index is 32.78 kg/m as calculated from the following:   Height as of this encounter: 5\' 11"  (1.803 m).   Weight as of this encounter: 106.6 kg.  Code Status: full code  DVT Prophylaxis:  scd's   Level of Care: Level of care: Med-Surg Family Communication: none at bedside.   Disposition Plan:     Remains inpatient appropriate:  IV fluids and possible d.c home in am.   Procedures:  CT abd and pelvis.   Consultants:   None.   Antimicrobials:   Anti-infectives (From admission, onward)    None        Medications  Scheduled Meds:  [START ON 09/20/2022] pneumococcal 20-valent conjugate vaccine  0.5 mL Intramuscular Tomorrow-1000   Continuous Infusions:  0.9 % NaCl with KCl 20 mEq / L 125 mL/hr at 09/19/22 1030   famotidine (PEPCID) IV 20 mg (09/19/22 0949)   PRN Meds:.ondansetron (ZOFRAN) IV    Subjective:   Francisco Bates was seen and examined today.  Nauseated. Some abdominal pain.   Objective:   Vitals:    09/19/22 0300 09/19/22 0339 09/19/22 0423 09/19/22 0758  BP: (!) 181/92  (!) 168/96 (!) 178/92  Pulse: 72  74 65  Resp: 16  18 18   Temp:  98 F (36.7 C) 98 F (36.7 C) 98.2 F (36.8 C)  TempSrc:  Oral    SpO2: 97%  93% 96%  Weight:      Height:        Intake/Output Summary (Last 24 hours) at 09/19/2022 1139 Last data filed at 09/19/2022 0700 Gross per 24 hour  Intake 2144.91 ml  Output 200 ml  Net 1944.91 ml   Filed Weights   09/18/22 1627  Weight: 106.6 kg     Exam General: Alert and oriented x 3, NAD Cardiovascular: S1 S2 auscultated, no murmurs, RRR Respiratory: Clear to auscultation bilaterally, no wheezing, rales or rhonchi Gastrointestinal: Soft, nontender, nondistended, + bowel sounds Ext: no pedal edema bilaterally Neuro: AAOx3, Cr N's II- XII. Strength 5/5 upper and lower extremities bilaterally Skin: No rashes Psych: Normal affect and demeanor, alert and oriented x3  Data Reviewed:  I have personally reviewed following labs and imaging studies   CBC Lab Results  Component Value Date   WBC 5.9 09/18/2022   RBC 5.27 09/18/2022   HGB 15.9 09/18/2022   HCT 46.6 09/18/2022   MCV 88.4 09/18/2022   MCH 30.2 09/18/2022   PLT 301 09/18/2022   MCHC 34.1 09/18/2022   RDW 14.7 09/18/2022   LYMPHSABS 2.6 07/15/2022   MONOABS 0.6 07/15/2022   EOSABS 0.2 07/15/2022   BASOSABS 0.1 07/15/2022     Last metabolic panel Lab Results  Component Value Date   NA 137 09/18/2022   K 3.8 09/18/2022   CL 101 09/18/2022   CO2 23 09/18/2022   BUN 7 09/18/2022   CREATININE 1.08 09/18/2022   GLUCOSE 115 (H) 09/18/2022   GFRNONAA >60 09/18/2022   GFRAA >60 10/19/2014   CALCIUM 8.7 (L) 09/18/2022   PROT 8.7 (H) 09/18/2022   ALBUMIN 4.1 09/18/2022   BILITOT 1.0 09/18/2022   ALKPHOS 75 09/18/2022   AST 57 (H) 09/18/2022   ALT 32 09/18/2022   ANIONGAP 13 09/18/2022    CBG (last 3)  No results for input(s): "GLUCAP" in the last 72 hours.    Coagulation  Profile: No results for input(s): "INR", "PROTIME" in the last 168 hours.   Radiology Studies: CT ABDOMEN PELVIS W CONTRAST  Result Date: 09/18/2022 CLINICAL DATA:  Acute generalized abdominal pain, nausea, vomiting. EXAM: CT ABDOMEN AND PELVIS WITH CONTRAST TECHNIQUE: Multidetector CT imaging of the abdomen and pelvis was performed using the standard protocol following bolus administration of intravenous contrast. RADIATION DOSE REDUCTION: This exam was performed according to the departmental dose-optimization program which includes automated exposure control, adjustment of the mA and/or kV according to patient size and/or use of iterative reconstruction technique. CONTRAST:  OMNIPAQUE IOHEXOL 300 MG/ML  SOLN COMPARISON:  March 02, 2020. FINDINGS: Lower chest: No acute abnormality. Hepatobiliary: No focal liver abnormality is seen. No gallstones, gallbladder wall thickening, or biliary dilatation. Pancreas: Unremarkable. No pancreatic ductal dilatation or surrounding inflammatory changes. Spleen: Normal in size without focal abnormality. Adrenals/Urinary Tract: Adrenal glands are unremarkable. Kidneys are normal, without renal calculi, focal lesion, or hydronephrosis. Bladder is unremarkable. Stomach/Bowel: Stomach is within normal limits. Appendix appears normal. No evidence of bowel wall thickening, distention, or inflammatory changes. Vascular/Lymphatic: No significant vascular findings are present. No enlarged abdominal or pelvic lymph nodes. Reproductive: Prostate is unremarkable. Other: No abdominal wall hernia or abnormality. No abdominopelvic ascites. Musculoskeletal: No acute or significant osseous findings. IMPRESSION: No definite abnormality seen in the abdomen or pelvis. Electronically Signed   By: Lupita Raider M.D.   On: 09/18/2022 18:39       Kathlen Mody M.D. [facility] 09/19/2022, 11:39 AM  Available via Epic secure chat 7am-7pm After 7 pm, please refer to night  coverage provider listed on amion.

## 2022-09-20 DIAGNOSIS — F1010 Alcohol abuse, uncomplicated: Secondary | ICD-10-CM | POA: Diagnosis not present

## 2022-09-20 DIAGNOSIS — I10 Essential (primary) hypertension: Secondary | ICD-10-CM

## 2022-09-20 DIAGNOSIS — R112 Nausea with vomiting, unspecified: Secondary | ICD-10-CM | POA: Diagnosis not present

## 2022-09-20 DIAGNOSIS — K2920 Alcoholic gastritis without bleeding: Secondary | ICD-10-CM

## 2022-09-20 LAB — CULTURE, BLOOD (ROUTINE X 2)

## 2022-09-20 MED ORDER — PNEUMOCOCCAL 20-VAL CONJ VACC 0.5 ML IM SUSY: 0.5000 mL | PREFILLED_SYRINGE | INTRAMUSCULAR | Status: DC | PRN

## 2022-09-20 MED ORDER — AMLODIPINE BESYLATE 10 MG PO TABS
10.0000 mg | ORAL_TABLET | Freq: Every day | ORAL | Status: DC
  Administered 2022-09-21 – 2022-09-23 (×3): 10 mg via ORAL
  Filled 2022-09-20 (×3): qty 1

## 2022-09-20 NOTE — Progress Notes (Signed)
[facility]                                                                               Francisco Bates, is a 57 y.o. male, DOB - 01-02-1966, ZOX:[id number] Admit date - 09/18/2022    Outpatient Primary MD for the patient is Francisco Bates, Provider, MD  LOS - 1  days    Brief summary   57 year old male with past medical history of alcohol dependence. Last few days developed nausea and vomiting, he can keep anything down. He endorses abdominal cramps, lightheadedness and dizziness. He denies hematemesis. CT abd and pelvis unremarkable.    Assessment & Plan    Assessment and Plan:   Lactic acidosis from intractable nausea, vomiting  from acute gastroenteritis  Mild to mod intermittent abd pain.  Symptomatic management with IV zofran, IV fluids and clear liquid diet .  Advanced diet as tolerated. Currently on soft diet and if he is able to tolerate , can be discharged in am.   Lactic acidosis resolved.    Chronic alcohol abuse On CIWA protocol.  No withdrawals today.    Hypertension BP parameters have improved.  Increase norvasc to 10 mg daily.    Estimated body mass index is 32.78 kg/m as calculated from the following:   Height as of this encounter: 5\' 11"  (1.803 m).   Weight as of this encounter: 106.6 kg.  Code Status: full code  DVT Prophylaxis:  enoxaparin (LOVENOX) injection 40 mg Start: 09/19/22 2200scd's   Level of Care: Level of care: Med-Surg Family Communication: none at bedside.   Disposition Plan:     Remains inpatient appropriate:  IV fluids and possible d.c home in am.   Procedures:  CT abd and pelvis.   Consultants:   None.   Antimicrobials:   Anti-infectives (From admission, onward)    None        Medications  Scheduled Meds:  [START ON 09/21/2022] amLODipine  10 mg Oral Daily   enoxaparin (LOVENOX) injection  40 mg Subcutaneous Q24H   phenazopyridine  100 mg Oral TID WC   Continuous Infusions:  0.9 % NaCl with KCl 20  mEq / L 75 mL/hr at 09/20/22 1250   famotidine (PEPCID) IV 20 mg (09/20/22 0931)   PRN Meds:.hydrALAZINE, ondansetron (ZOFRAN) IV, pneumococcal 20-valent conjugate vaccine    Subjective:   Francisco Bates was seen and examined today.  Abd pain and bladder spasms have improved.  Some nausea but no vomiting.   Objective:   Vitals:   09/19/22 1919 09/20/22 0040 09/20/22 0542 09/20/22 0834  BP: (!) 158/87 (!) 158/99 (!) 160/95 (!) 153/93  Pulse: 78 74 79 87  Resp: 15 15 15    Temp: 98.6 F (37 C) 98.3 F (36.8 C) 98.2 F (36.8 C) 98.4 F (36.9 C)  TempSrc: Oral Oral Oral   SpO2: 98% 95% 94% 92%  Weight:      Height:        Intake/Output Summary (Last 24 hours) at 09/20/2022 1517 Last data filed at 09/20/2022 0310 Gross per 24 hour  Intake 1157.47 ml  Output 900 ml  Net 257.47 ml    [employer]   09/18/22  1627  Weight: 106.6 kg     Exam General exam: Appears calm and comfortable  Respiratory system: Clear to auscultation. Respiratory effort normal. Cardiovascular system: S1 & S2 heard, RRR.  Gastrointestinal system: Abdomen is nondistended, soft and nontender.  Central nervous system: Alert and oriented. No focal neurological deficits. Extremities: Symmetric 5 x 5 power. Skin: No rashes, lesions or ulcers Psychiatry: Judgement and insight appear normal. Mood & affect appropriate.     Data Reviewed:  I have personally reviewed following labs and imaging studies   CBC Lab Results  Component Value Date   WBC 6.7 09/19/2022   RBC 4.77 09/19/2022   HGB 14.5 09/19/2022   HCT 41.7 09/19/2022   MCV 87.4 09/19/2022   MCH 30.4 09/19/2022   PLT 232 09/19/2022   MCHC 34.8 09/19/2022   RDW 14.0 09/19/2022   LYMPHSABS 2.6 07/15/2022   MONOABS 0.6 07/15/2022   EOSABS 0.2 07/15/2022   BASOSABS 0.1 07/15/2022     Last metabolic panel Lab Results  Component Value Date   NA 137 09/18/2022   K 3.8 09/18/2022   CL 101 09/18/2022   CO2 23 09/18/2022   BUN 7  09/18/2022   CREATININE 0.98 09/19/2022   GLUCOSE 115 (H) 09/18/2022   GFRNONAA >60 09/19/2022   GFRAA >60 10/19/2014   CALCIUM 8.7 (L) 09/18/2022   PROT 8.7 (H) 09/18/2022   ALBUMIN 4.1 09/18/2022   BILITOT 1.0 09/18/2022   ALKPHOS 75 09/18/2022   AST 57 (H) 09/18/2022   ALT 32 09/18/2022   ANIONGAP 13 09/18/2022    CBG (last 3)  No results for input(s): "GLUCAP" in the last 72 hours.    Coagulation Profile: No results for input(s): "INR", "PROTIME" in the last 168 hours.   Radiology Studies: CT ABDOMEN PELVIS W CONTRAST  Result Date: 09/18/2022 CLINICAL DATA:  Acute generalized abdominal pain, nausea, vomiting. EXAM: CT ABDOMEN AND PELVIS WITH CONTRAST TECHNIQUE: Multidetector CT imaging of the abdomen and pelvis was performed using the standard protocol following bolus administration of intravenous contrast. RADIATION DOSE REDUCTION: This exam was performed according to the departmental dose-optimization program which includes automated exposure control, adjustment of the mA and/or kV according to patient size and/or use of iterative reconstruction technique. CONTRAST:  OMNIPAQUE IOHEXOL 300 MG/ML  SOLN COMPARISON:  March 02, 2020. FINDINGS: Lower chest: No acute abnormality. Hepatobiliary: No focal liver abnormality is seen. No gallstones, gallbladder wall thickening, or biliary dilatation. Pancreas: Unremarkable. No pancreatic ductal dilatation or surrounding inflammatory changes. Spleen: Normal in size without focal abnormality. Adrenals/Urinary Tract: Adrenal glands are unremarkable. Kidneys are normal, without renal calculi, focal lesion, or hydronephrosis. Bladder is unremarkable. Stomach/Bowel: Stomach is within normal limits. Appendix appears normal. No evidence of bowel wall thickening, distention, or inflammatory changes. Vascular/Lymphatic: No significant vascular findings are present. No enlarged abdominal or pelvic lymph nodes. Reproductive: Prostate is  unremarkable. Other: No abdominal wall hernia or abnormality. No abdominopelvic ascites. Musculoskeletal: No acute or significant osseous findings. IMPRESSION: No definite abnormality seen in the abdomen or pelvis. Electronically Signed   By: Lupita Raider M.D.   On: 09/18/2022 18:39       Kathlen Mody M.D. [facility] 09/20/2022, 3:17 PM  Available via Epic secure chat 7am-7pm After 7 pm, please refer to night coverage provider listed on amion.

## 2022-09-20 NOTE — Evaluation (Signed)
Physical Therapy Evaluation/ Discharge Patient Details Name: Francisco Bates MRN: [id number] DOB: [birth year] Today's Date: 09/20/2022  History of Present Illness  57 yo male admitted 6/26 with N/V, acute gastroenteritis. Abdominal CT (-). PMHx: ETOH dependence, obesity, Rt patellar tendon repair  Clinical Impression  Pt pleasant and reports lack of sleep but improving tolerance for food. Pt able to complete all mobility without assist including long hall ambulation and stairs. Pt with significant other who can provide assist as needed. Encouraged daily ambulation and up to bathroom. Pt without further therapy needs and will sign off with pt aware and agreeable.        Recommendations for follow up therapy are one component of a multi-disciplinary discharge planning process, led by the attending physician.  Recommendations may be updated based on patient status, additional functional criteria and insurance authorization.  Follow Up Recommendations       Assistance Recommended at Discharge None  Patient can return home with the following       Equipment Recommendations None recommended by PT  Recommendations for Other Services       Functional Status Assessment Patient has not had a recent decline in their functional status     Precautions / Restrictions Precautions Precautions: None      Mobility  Bed Mobility Overal bed mobility: Independent                  Transfers Overall transfer level: Independent                      Ambulation/Gait Ambulation/Gait assistance: Independent Gait Distance (Feet): 400 Feet Assistive device: None Gait Pattern/deviations: WFL(Within Functional Limits)   Gait velocity interpretation: >4.37 ft/sec, indicative of normal walking speed      Stairs Stairs: Yes Stairs assistance: Modified independent (Device/Increase time) Stair Management: No rails Number of Stairs: 4    Wheelchair Mobility    Modified Rankin  (Stroke Patients Only)       Balance Overall balance assessment: No apparent balance deficits (not formally assessed)                                           Pertinent Vitals/Pain Pain Assessment Pain Assessment: No/denies pain    Home Living Family/patient expects to be discharged to:: Private residence Living Arrangements: Spouse/significant other Available Help at Discharge: Friend(s);Available PRN/intermittently Type of Home: House Home Access: Stairs to enter   Entrance Stairs-Number of Steps: 5 Alternate Level Stairs-Number of Steps: 14 Home Layout: Two level;Bed/bath upstairs Home Equipment: Shower seat - built in      Prior Function Prior Level of Function : Independent/Modified Independent;Driving;Working/employed                     Hand Dominance        Extremity/Trunk Assessment   Upper Extremity Assessment Upper Extremity Assessment: Overall WFL for tasks assessed    Lower Extremity Assessment Lower Extremity Assessment: Overall WFL for tasks assessed    Cervical / Trunk Assessment Cervical / Trunk Assessment: Normal  Communication   Communication: No difficulties  Cognition Arousal/Alertness: Awake/alert Behavior During Therapy: WFL for tasks assessed/performed Overall Cognitive Status: Within Functional Limits for tasks assessed  General Comments      Exercises     Assessment/Plan    PT Assessment Patient does not need any further PT services  PT Problem List         PT Treatment Interventions      PT Goals (Current goals can be found in the Care Plan section)  Acute Rehab PT Goals PT Goal Formulation: All assessment and education complete, DC therapy    Frequency       Co-evaluation               AM-PAC PT "6 Clicks" Mobility  Outcome Measure Help needed turning from your back to your side while in a flat bed without using  bedrails?: None Help needed moving from lying on your back to sitting on the side of a flat bed without using bedrails?: None Help needed moving to and from a bed to a chair (including a wheelchair)?: None Help needed standing up from a chair using your arms (e.g., wheelchair or bedside chair)?: None Help needed to walk in hospital room?: None Help needed climbing 3-5 steps with a railing? : None 6 Click Score: 24    End of Session   Activity Tolerance: Patient tolerated treatment well Patient left: in bed;with call bell/phone within reach (pt reports fatigue and need to sleep, declined OOB to chair) Nurse Communication: Mobility status PT Visit Diagnosis: Other abnormalities of gait and mobility (R26.89)    Time: 1610-9604 PT Time Calculation (min) (ACUTE ONLY): 15 min   Charges:   PT Evaluation $PT Eval Low Complexity: 1 Low          Chaz Ronning P, PT Acute Rehabilitation Services Office: [phone]   Enedina Finner Tal Kempker 09/20/2022, 1:36 PM

## 2022-09-21 DIAGNOSIS — I10 Essential (primary) hypertension: Secondary | ICD-10-CM | POA: Diagnosis not present

## 2022-09-21 DIAGNOSIS — R112 Nausea with vomiting, unspecified: Secondary | ICD-10-CM | POA: Diagnosis not present

## 2022-09-21 DIAGNOSIS — K2920 Alcoholic gastritis without bleeding: Secondary | ICD-10-CM | POA: Diagnosis not present

## 2022-09-21 DIAGNOSIS — F1010 Alcohol abuse, uncomplicated: Secondary | ICD-10-CM | POA: Diagnosis not present

## 2022-09-21 LAB — C DIFFICILE QUICK SCREEN W PCR REFLEX
C Diff antigen: NEGATIVE
C Diff interpretation: NOT DETECTED
C Diff toxin: NEGATIVE

## 2022-09-21 LAB — CULTURE, BLOOD (ROUTINE X 2)
Special Requests: ADEQUATE
Special Requests: ADEQUATE

## 2022-09-21 MED ORDER — PANTOPRAZOLE SODIUM 40 MG PO TBEC
40.0000 mg | DELAYED_RELEASE_TABLET | Freq: Two times a day (BID) | ORAL | Status: DC
  Administered 2022-09-21 – 2022-09-23 (×5): 40 mg via ORAL
  Filled 2022-09-21 (×5): qty 1

## 2022-09-21 NOTE — Progress Notes (Signed)
BP 157/95, PRN hydralazine was administered per MARS order; BP recheck was 149/89  Francisco Bates

## 2022-09-21 NOTE — Progress Notes (Addendum)
[facility]                                                                               Francisco Bates, is a 57 y.o. male, DOB - 05/12/1965, [id number] Admit date - 09/18/2022    Outpatient Primary MD for the patient is Default, Provider, MD  LOS - 2  days    Brief summary   57 year old male with past medical history of alcohol dependence. Last few days developed nausea and vomiting, he can keep anything down. He endorses abdominal cramps, lightheadedness and dizziness. He denies hematemesis. CT abd and pelvis unremarkable.    Assessment & Plan    Assessment and Plan:   Lactic acidosis from intractable nausea, vomiting  from acute gastroenteritis  Mild to mod intermittent abd pain.  Symptomatic management with IV zofran, IV fluids and clear liquid diet .  Advanced diet as tolerated. Pt reports abd discomfort and loose BM's today.    Gastritis:  Added PPI.   Lactic acidosis resolved.    Chronic alcohol abuse No withdrawals today.    Hypertension BP parameters have improved.  Increase norvasc to 10 mg daily.    Estimated body mass index is 32.78 kg/m as calculated from the following:   Height as of this encounter: 5\' 11"  (1.803 m).   Weight as of this encounter: 106.6 kg.  Code Status: full code  DVT Prophylaxis:  enoxaparin (LOVENOX) injection 40 mg Start: 09/19/22 2200scd's   Level of Care: Level of care: Med-Surg Family Communication: none at bedside.   Disposition Plan:     Remains inpatient appropriate:  IV fluids and possible d.c home in am.   Procedures:  CT abd and pelvis.   Consultants:   None.   Antimicrobials:   Anti-infectives (From admission, onward)    None        Medications  Scheduled Meds:  amLODipine  10 mg Oral Daily   enoxaparin (LOVENOX) injection  40 mg Subcutaneous Q24H   pantoprazole  40 mg Oral BID   phenazopyridine  100 mg Oral TID WC   Continuous Infusions:   PRN Meds:.hydrALAZINE,  ondansetron (ZOFRAN) IV, pneumococcal 20-valent conjugate vaccine    Subjective:   [name] was seen and examined today.  Some nausea, and loose BM today.   Objective:   Vitals:   09/21/22 0522 09/21/22 0642 09/21/22 0737 09/21/22 1627  BP: (!) 157/95 (!) 149/89 (!) 149/89 (!) 143/87  Pulse: 80 87 89 85  Resp: 15  18 18   Temp: 98.3 F (36.8 C)  98.4 F (36.9 C) 98.3 F (36.8 C)  TempSrc: Oral  Oral Oral  SpO2: 97%  99% 97%  Weight:      Height:        Intake/Output Summary (Last 24 hours) at 09/21/2022 1702 Last data filed at 09/21/2022 1224 Gross per 24 hour  Intake 2566.22 ml  Output 1080 ml  Net 1486.22 ml    Filed Weights   09/18/22 1627  Weight: 106.6 kg     Exam General exam: Appears calm and comfortable  Respiratory system: Clear to auscultation. Respiratory effort normal. Cardiovascular system: S1 & S2 heard, RRR. No JVD,  Gastrointestinal system: Abdomen is nondistended, soft and nontender.  Central nervous system: Alert and oriented. No focal neurological deficits. Extremities: Symmetric 5 x 5 power. Skin: No rashes,  Psychiatry: Mood & affect appropriate.     Data Reviewed:  I have personally reviewed following labs and imaging studies   CBC Lab Results  Component Value Date   WBC 6.7 09/19/2022   RBC 4.77 09/19/2022   HGB 14.5 09/19/2022   HCT 41.7 09/19/2022   MCV 87.4 09/19/2022   MCH 30.4 09/19/2022   PLT 232 09/19/2022   MCHC 34.8 09/19/2022   RDW 14.0 09/19/2022   LYMPHSABS 2.6 07/15/2022   MONOABS 0.6 07/15/2022   EOSABS 0.2 07/15/2022   BASOSABS 0.1 07/15/2022     Last metabolic panel Lab Results  Component Value Date   NA 137 09/18/2022   K 3.8 09/18/2022   CL 101 09/18/2022   CO2 23 09/18/2022   BUN 7 09/18/2022   CREATININE 0.98 09/19/2022   GLUCOSE 115 (H) 09/18/2022   GFRNONAA >60 09/19/2022   GFRAA >60 10/19/2014   CALCIUM 8.7 (L) 09/18/2022   PROT 8.7 (H) 09/18/2022   ALBUMIN 4.1 09/18/2022   BILITOT  1.0 09/18/2022   ALKPHOS 75 09/18/2022   AST 57 (H) 09/18/2022   ALT 32 09/18/2022   ANIONGAP 13 09/18/2022    CBG (last 3)  No results for input(s): "GLUCAP" in the last 72 hours.    Coagulation Profile: No results for input(s): "INR", "PROTIME" in the last 168 hours.   Radiology Studies: No results found.     Kathlen Mody M.D. [facility] 09/21/2022, 5:02 PM  Available via Epic secure chat 7am-7pm After 7 pm, please refer to night coverage provider listed on amion.

## 2022-09-21 NOTE — Plan of Care (Signed)

## 2022-09-22 ENCOUNTER — Inpatient Hospital Stay (HOSPITAL_COMMUNITY): Payer: BC Managed Care – PPO

## 2022-09-22 DIAGNOSIS — F1010 Alcohol abuse, uncomplicated: Secondary | ICD-10-CM | POA: Diagnosis not present

## 2022-09-22 DIAGNOSIS — R112 Nausea with vomiting, unspecified: Secondary | ICD-10-CM | POA: Diagnosis not present

## 2022-09-22 DIAGNOSIS — K2920 Alcoholic gastritis without bleeding: Secondary | ICD-10-CM | POA: Diagnosis not present

## 2022-09-22 DIAGNOSIS — I10 Essential (primary) hypertension: Secondary | ICD-10-CM | POA: Diagnosis not present

## 2022-09-22 LAB — CULTURE, BLOOD (ROUTINE X 2)

## 2022-09-22 MED ORDER — BENZONATATE 100 MG PO CAPS: 200.0000 mg | ORAL_CAPSULE | Freq: Three times a day (TID) | ORAL | Status: DC | PRN

## 2022-09-22 NOTE — TOC Initial Note (Signed)
Transition of Care [facility]) - Initial/Assessment Note    Patient Details  Name: Francisco Bates MRN: [id number] Date of Birth: 1965/09/19  Transition of Care [facility]) CM/SW Contact:    Janae Bridgeman, RN Phone Number: 09/22/2022, 11:45 AM  Clinical Narrative:                 CM met with the patient at the bedside to discuss TOC needs.  The patient lives in apartment with his significant other.  Both the patient and girlfriend are able to drive.  PCP - patient is schedule for PCP follow up at [facility].  I called the PCP to reschedule for a hospital follow up if possible.  Patient drinks ETOH on the weekends only at this time per patient.  The patient was provided with OP resources in the AVS to follow up for counseling if needed.  Expected Discharge Plan: Home/Self Care Barriers to Discharge: Continued Medical Work up   Patient Goals and CMS Choice Patient states their goals for this hospitalization and ongoing recovery are:: To return home CMS Medicare.gov Compare Post Acute Care list provided to:: Patient Choice offered to / list presented to : Patient [place] ownership interest in [facility]provided to:: Patient    Expected Discharge Plan and Services   Discharge Planning Services: CM Consult   Living arrangements for the past 2 months: Apartment                                      Prior Living Arrangements/Services Living arrangements for the past 2 months: Apartment Lives with:: Significant Other Patient language and need for interpreter reviewed:: Yes Do you feel safe going back to the place where you live?: Yes      Need for Family Participation in Patient Care: Yes (Comment) Care giver support system in place?: Yes (comment)   Criminal Activity/Legal Involvement Pertinent to Current Situation/Hospitalization: No - Comment as needed  Activities of Daily Living Home Assistive Devices/Equipment: None ADL Screening  (condition at time of admission) Patient's cognitive ability adequate to safely complete daily activities?: Yes Is the patient deaf or have difficulty hearing?: No Does the patient have difficulty seeing, even when wearing glasses/contacts?: No Does the patient have difficulty concentrating, remembering, or making decisions?: No Patient able to express need for assistance with ADLs?: Yes Does the patient have difficulty dressing or bathing?: No Independently performs ADLs?: Yes (appropriate for developmental age) Does the patient have difficulty walking or climbing stairs?: No Weakness of Legs: None Weakness of Arms/Hands: None  Permission Sought/Granted Permission sought to share information with : Case Manager, PCP Permission granted to share information with : Yes, Verbal Permission Granted     Permission granted to share info w AGENCY: PCP called to schedule hospital follow up for the patient for earlier visit if possible        Emotional Assessment Appearance:: Appears stated age Attitude/Demeanor/Rapport: Gracious Affect (typically observed): Accepting Orientation: : Oriented to Self, Oriented to Place, Oriented to  Time, Oriented to Situation Alcohol / Substance Use: Alcohol Use (Drinks alcohol on the weekends only - per patient) Psych Involvement: No (comment)  Admission diagnosis:  Lactic acidosis [E87.20] Alcohol abuse [F10.10] Intractable nausea and vomiting [R11.2] Non-traumatic rhabdomyolysis [M62.82] Patient Active Problem List   Diagnosis Date Noted   Intractable nausea and vomiting 09/18/2022   Chronic alcohol abuse    Alcohol  withdrawal (HCC) 10/19/2014   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 [place] [address] Phone: [phone] Fax: [phone]     Social Determinants of Health (SDOH) Social History: SDOH Screenings   Food Insecurity: No Food Insecurity (09/19/2022)  Housing: Low Risk  (09/19/2022)  Transportation Needs: No Transportation Needs (09/19/2022)  Utilities: Not At Risk (09/19/2022)  Tobacco Use: Low Risk  (09/18/2022)   SDOH Interventions:     Readmission Risk Interventions    09/22/2022   11:45 AM  Readmission Risk Prevention Plan  Post Dischage Appt Complete  Medication Screening Complete  Transportation Screening Complete

## 2022-09-22 NOTE — Progress Notes (Signed)
[facility]                                                                               Francisco Bates, is a 57 y.o. male, DOB - Dec 29, 1965, JXB:[id number] Admit date - 09/18/2022    Outpatient Primary MD for the patient is Default, Provider, MD  LOS - 3  days    Brief summary   57 year old male with past medical history of alcohol dependence. Last few days developed nausea and vomiting, he can keep anything down. He endorses abdominal cramps, lightheadedness and dizziness. He denies hematemesis. CT abd and pelvis unremarkable.    Assessment & Plan    Assessment and Plan:   Lactic acidosis from intractable nausea, vomiting  from acute gastroenteritis  Mild to mod intermittent abd pain.  Symptomatic management with IV zofran, IV fluids and clear liquid diet .  Advanced diet as tolerated.   Cough, intermittent - CXR ordered.  Suspect from acid reflux.    Gastritis:  Added PPI.   Lactic acidosis resolved.    Chronic alcohol abuse No withdrawals today.    Hypertension BP parameters have improved.  Increase norvasc to 10 mg daily.    Estimated body mass index is 34.1 kg/m as calculated from the following:   Height as of this encounter: 5\' 11"  (1.803 m).   Weight as of this encounter: 110.9 kg.  Code Status: full code  DVT Prophylaxis:  enoxaparin (LOVENOX) injection 40 mg Start: 09/19/22 2200scd's   Level of Care: Level of care: Med-Surg Family Communication: none at bedside.   Disposition Plan:     Remains inpatient appropriate:  IV fluids and possible d.c home in am.   Procedures:  CT abd and pelvis.   Consultants:   None.   Antimicrobials:   Anti-infectives (From admission, onward)    None        Medications  Scheduled Meds:  amLODipine  10 mg Oral Daily   enoxaparin (LOVENOX) injection  40 mg Subcutaneous Q24H   pantoprazole  40 mg Oral BID   Continuous Infusions:   PRN Meds:.benzonatate, hydrALAZINE, ondansetron  (ZOFRAN) IV, pneumococcal 20-valent conjugate vaccine    Subjective:   Francisco Bates was seen and examined today. Cough.   Objective:   Vitals:   09/22/22 0456 09/22/22 0551 09/22/22 0728 09/22/22 1616  BP: (!) 145/88  (!) 154/87 (!) 140/78  Pulse: 80  78 81  Resp: 15  18 20   Temp: 98 F (36.7 C)  98.1 F (36.7 C) 98.3 F (36.8 C)  TempSrc: Oral   Oral  SpO2: 98%  100% 98%  Weight:  110.9 kg    Height:       No intake or output data in the 24 hours ending 09/22/22 1910  Filed Weights   09/18/22 1627 09/22/22 0551  Weight: 106.6 kg 110.9 kg     Exam General exam: Appears calm and comfortable  Respiratory system: Clear to auscultation. Respiratory effort normal. Cardiovascular system: S1 & S2 heard, RRR. No JVD,  Gastrointestinal system: Abdomen is nondistended, soft and nontender.  Central nervous system: Alert and oriented. Extremities: Symmetric 5 x 5 power. Skin: No rashes, Psychiatry:  Mood &  affect appropriate.     Data Reviewed:  I have personally reviewed following labs and imaging studies   CBC Lab Results  Component Value Date   WBC 6.7 09/19/2022   RBC 4.77 09/19/2022   HGB 14.5 09/19/2022   HCT 41.7 09/19/2022   MCV 87.4 09/19/2022   MCH 30.4 09/19/2022   PLT 232 09/19/2022   MCHC 34.8 09/19/2022   RDW 14.0 09/19/2022   LYMPHSABS 2.6 07/15/2022   MONOABS 0.6 07/15/2022   EOSABS 0.2 07/15/2022   BASOSABS 0.1 07/15/2022     Last metabolic panel Lab Results  Component Value Date   NA 137 09/18/2022   K 3.8 09/18/2022   CL 101 09/18/2022   CO2 23 09/18/2022   BUN 7 09/18/2022   CREATININE 0.98 09/19/2022   GLUCOSE 115 (H) 09/18/2022   GFRNONAA >60 09/19/2022   GFRAA >60 10/19/2014   CALCIUM 8.7 (L) 09/18/2022   PROT 8.7 (H) 09/18/2022   ALBUMIN 4.1 09/18/2022   BILITOT 1.0 09/18/2022   ALKPHOS 75 09/18/2022   AST 57 (H) 09/18/2022   ALT 32 09/18/2022   ANIONGAP 13 09/18/2022    CBG (last 3)  No results for input(s):  "GLUCAP" in the last 72 hours.    Coagulation Profile: No results for input(s): "INR", "PROTIME" in the last 168 hours.   Radiology Studies: DG Chest 2 View  Result Date: 09/22/2022 CLINICAL DATA:  Cough EXAM: CHEST - 2 VIEW COMPARISON:  Chest radiograph dated March 02, 2020 FINDINGS: The heart size and mediastinal contours are within normal limits. Both lungs are clear. The visualized skeletal structures are unremarkable. IMPRESSION: No active cardiopulmonary disease. Electronically Signed   By: Larose Hires D.O.   On: 09/22/2022 18:55       Kathlen Mody M.D. [facility] 09/22/2022, 7:10 PM  Available via Epic secure chat 7am-7pm After 7 pm, please refer to night coverage provider listed on amion.

## 2022-09-23 LAB — CULTURE, BLOOD (ROUTINE X 2)
Culture: NO GROWTH
Culture: NO GROWTH

## 2022-09-23 MED ORDER — BENZONATATE 100 MG PO CAPS: 100.0000 mg | ORAL_CAPSULE | Freq: Three times a day (TID) | ORAL | 0 refills | Status: AC | PRN

## 2022-09-23 MED ORDER — PANTOPRAZOLE SODIUM 40 MG PO TBEC: 40.0000 mg | DELAYED_RELEASE_TABLET | Freq: Every day | ORAL | 1 refills | Status: DC

## 2022-09-23 MED ORDER — AMLODIPINE BESYLATE 10 MG PO TABS: 10.0000 mg | ORAL_TABLET | Freq: Every day | ORAL | 3 refills | Status: DC

## 2022-09-24 NOTE — Progress Notes (Signed)
Patient ID: Francisco Bates, male    DOB: 04-Feb-1966  MRN: [id number]  CC: Hospital Discharge Follow-Up  Subjective: Francisco Bates is a 57 y.o. male who presents for hospital discharge follow-up.   His concerns today include:  09/18/2022 - 09/23/2022 [facility] per MD note: Assessment and Plan:  Lactic acidosis from intractable nausea, vomiting  from acute gastroenteritis  Mild to mod intermittent abd pain.  Symptomatic management with IV zofran, IV fluids and clear liquid diet .  Advanced diet as tolerated. Currently his pain is resolved.        Cough, intermittent - CXR ordered. Negative.  Suspect from acid reflux.      Gastritis:  Added PPI.    Lactic acidosis resolved.      Chronic alcohol abuse No withdrawals today.      Hypertension BP parameters have improved.  Increase norvasc to 10 mg daily.      Estimated body mass index is 34.1 kg/m as calculated from the following:   Height as of this encounter: 5\' 11"  (1.803 m).   Weight as of this encounter: 110.9 kg.  Follow-Ups: Follow [facility] with [facility]; You are scheduled for a hospital follow up on Friday, 09/26/22 at 1040.  The office did not have any afternoon apartments.  Please call the office if you need to reschedule your appointment.   Today's visit 09/26/2022: Feeling improved since hospital discharge. He is taking Amlodipine as prescribed, no issues/concerns. He does not check blood pressure outside of office. He does monitor salt intake. He does exercise. He does not complain of red flag symptoms such as but not limited to chest pain, shortness of breath, worst headache of life, nausea/vomiting. Doing well on Pantoprazole, no issues/concerns. Reports he consumes alcohol on weekends only. No further issues/concerns for discussion today.   Patient Active Problem List   Diagnosis Date Noted   Intractable nausea and vomiting 09/18/2022   Chronic alcohol abuse    Alcohol withdrawal  (HCC) 10/19/2014   Alcoholic gastritis 10/19/2014   Nausea and vomiting in adult 10/19/2014   Expected blood loss anemia 11/16/2012   Obese 11/16/2012   Right patellar tendon rupture 11/15/2012   Alcohol dependence (HCC) 03/25/2011     Current Outpatient Medications on File Prior to Visit  Medication Sig Dispense Refill   amLODipine (NORVASC) 10 MG tablet Take 1 tablet (10 mg total) by mouth daily. 30 tablet 3   benzonatate (TESSALON) 100 MG capsule Take 1 capsule (100 mg total) by mouth 3 (three) times daily as needed for cough. 60 capsule 0   pantoprazole (PROTONIX) 40 MG tablet Take 1 tablet (40 mg total) by mouth daily. 60 tablet 1   No current facility-administered medications on file prior to visit.    No Known Allergies  Social History   Socioeconomic History   Marital status: Divorced    Spouse name: Not on file   Number of children: Not on file   Years of education: Not on file   Highest education level: Not on file  Occupational History   Not on file  Tobacco Use   Smoking status: Never   Smokeless tobacco: Never  Vaping Use   Vaping Use: Never used  Substance and Sexual Activity   Alcohol use: No    Comment: quit in 08/1974   Drug use: No   Sexual activity: Not on file  Other Topics Concern   Not on file  Social History Narrative   Not  on file   Social Determinants of Health   Financial Resource Strain: Not on file  Food Insecurity: No Food Insecurity (09/19/2022)   Hunger Vital Sign    Worried About Running Out of Food in the Last Year: Never true    Ran Out of Food in the Last Year: Never true  Transportation Needs: No Transportation Needs (09/19/2022)   PRAPARE - Administrator, Civil Service (Medical): No    Lack of Transportation (Non-Medical): No  Physical Activity: Not on file  Stress: Not on file  Social Connections: Not on file  Intimate Partner Violence: Not At Risk (09/19/2022)   Humiliation, Afraid, Rape, and Kick questionnaire     Fear of Current or Ex-Partner: No    Emotionally Abused: No    Physically Abused: No    Sexually Abused: No    Family History  Problem Relation Age of Onset   Hypertension Other    Cancer Mother    Cancer Father     Past Surgical History:  Procedure Laterality Date   ACHILLES TENDON SURGERY     KNEE SURGERY     1995   PATELLAR TENDON REPAIR Right 11/15/2012   Procedure: OPEN REPAIR RIGHT PATELLA TENDON ;  Surgeon: [name], MD;  Location: [facility];  Service: Orthopedics;  Laterality: Right;    ROS: Review of Systems Negative except as stated above  PHYSICAL EXAM: BP 124/79   Pulse 85   Temp 98.5 F (36.9 C) (Oral)   Ht 5\' 11"  (1.803 m)   Wt 245 lb (111.1 kg)   SpO2 97%   BMI 34.17 kg/m   Physical Exam HENT:     Head: Normocephalic and atraumatic.     Nose: Nose normal.     Mouth/Throat:     Mouth: Mucous membranes are moist.     Pharynx: Oropharynx is clear.  Eyes:     Extraocular Movements: Extraocular movements intact.     Conjunctiva/sclera: Conjunctivae normal.     Pupils: Pupils are equal, round, and reactive to light.  Cardiovascular:     Rate and Rhythm: Normal rate and regular rhythm.     Pulses: Normal pulses.     Heart sounds: Normal heart sounds.  Pulmonary:     Effort: Pulmonary effort is normal.     Breath sounds: Normal breath sounds.  Musculoskeletal:        General: Normal range of motion.     Right shoulder: Normal.     Left shoulder: Normal.     Right upper arm: Normal.     Left upper arm: Normal.     Right elbow: Normal.     Left elbow: Normal.     Right forearm: Normal.     Left forearm: Normal.     Right wrist: Normal.     Left wrist: Normal.     Right hand: Normal.     Left hand: Normal.     Cervical back: Normal, normal range of motion and neck supple.     Thoracic back: Normal.     Lumbar back: Normal.     Right hip: Normal.     Left hip: Normal.     Right upper leg: Normal.     Left upper leg: Normal.      Right knee: Normal.     Left knee: Normal.     Right lower leg: Normal.     Left lower leg: Normal.     Right ankle: Normal.  Left ankle: Normal.     Right foot: Normal.     Left foot: Normal.  Neurological:     General: No focal deficit present.     Mental Status: He is alert and oriented to person, place, and time.  Psychiatric:        Mood and Affect: Mood normal.        Behavior: Behavior normal.      ASSESSMENT AND PLAN: 1. Hospital discharge follow-up - Reviewed hospital course, current medications, ensured proper follow-up in place, and addressed concerns.   2. Lactic acidosis 3. Acute alcoholic gastritis without hemorrhage 4. Intractable nausea and vomiting - Resolved prior to hospital discharge.  - Continue Pantoprazole as prescribed. No refills needed as of present.  - Follow-up with primary provider in 3 months or sooner if needed.   5. Alcohol abuse - Patient reports he consumes alcohol only on the weekends.  6. Non-traumatic rhabdomyolysis - Resolved.  7. Primary hypertension - Continue Amlodipine as prescribed. No refills needed as of present.  - Counseled on blood pressure goal of less than 130/80, low-sodium, DASH diet, medication compliance, and 150 minutes of moderate intensity exercise per week as tolerated. Counseled on medication adherence and adverse effects. - Follow-up with primary provider in 3 months or sooner if needed.     Patient was given the opportunity to ask questions.  Patient verbalized understanding of the plan and was able to repeat key elements of the plan. Patient was given clear instructions to go to Emergency Department or return to medical center if symptoms don't improve, worsen, or new problems develop.The patient verbalized understanding.   Follow-up with primary provider as scheduled.   Rema Fendt, NP

## 2022-09-26 ENCOUNTER — Ambulatory Visit (INDEPENDENT_AMBULATORY_CARE_PROVIDER_SITE_OTHER): Payer: BC Managed Care – PPO | Admitting: Family

## 2022-09-26 ENCOUNTER — Encounter: Payer: Self-pay | Admitting: Family

## 2022-09-26 VITALS — BP 124/79 | HR 85 | Temp 98.5°F | Ht 71.0 in | Wt 245.0 lb

## 2022-09-26 DIAGNOSIS — R112 Nausea with vomiting, unspecified: Secondary | ICD-10-CM

## 2022-09-26 DIAGNOSIS — E872 Acidosis, unspecified: Secondary | ICD-10-CM

## 2022-09-26 DIAGNOSIS — Z09 Encounter for follow-up examination after completed treatment for conditions other than malignant neoplasm: Secondary | ICD-10-CM | POA: Diagnosis not present

## 2022-09-26 DIAGNOSIS — F1010 Alcohol abuse, uncomplicated: Secondary | ICD-10-CM

## 2022-09-26 DIAGNOSIS — K2920 Alcoholic gastritis without bleeding: Secondary | ICD-10-CM | POA: Diagnosis not present

## 2022-09-26 DIAGNOSIS — I10 Essential (primary) hypertension: Secondary | ICD-10-CM

## 2022-09-26 DIAGNOSIS — M6282 Rhabdomyolysis: Secondary | ICD-10-CM

## 2022-09-26 NOTE — Discharge Summary (Signed)
Physician Discharge Summary   Patient: Francisco Bates MRN: [id number] DOB: [birth year]  Admit date:     09/18/2022  Discharge date: 09/23/2022  Discharge Physician: Kathlen Mody   PCP: Default, Provider, MD   Recommendations at discharge:  Please follow up with PCp in one week.  Please follow up with GI for EGD.   Discharge Diagnoses: Principal Problem:   Intractable nausea and vomiting Active Problems:   Alcoholic gastritis   Chronic alcohol abuse  Resolved Problems:   * No resolved hospital problems. *  Hospital Course: 57 year old male with past medical history of alcohol dependence. Last few days developed nausea and vomiting, he can keep anything down. He endorses abdominal cramps, lightheadedness and dizziness. He denies hematemesis. CT abd and pelvis unremarkable.   Assessment and Plan:  Lactic acidosis from intractable nausea, vomiting  from acute gastroenteritis  Mild to mod intermittent abd pain.  Symptomatic management with IV zofran, IV fluids and clear liquid diet .  Advanced diet as tolerated. Currently his pain is resolved.       Cough, intermittent - CXR ordered. Negative.  Suspect from acid reflux.      Gastritis:  Added PPI.    Lactic acidosis resolved.      Chronic alcohol abuse No withdrawals today.      Hypertension BP parameters have improved.  Increase norvasc to 10 mg daily.      Estimated body mass index is 34.1 kg/m as calculated from the following:   Height as of this encounter: 5\' 11"  (1.803 m).   Weight as of this encounter: 110.9 kg.          Consultants: none.  Procedures performed: none.   Disposition: Home Diet recommendation:  Discharge Diet Orders (From admission, onward)     Start     Ordered   09/23/22 0000  Diet - low sodium heart healthy        09/23/22 0913           Regular diet DISCHARGE MEDICATION: Allergies as of 09/23/2022   No Known Allergies      Medication List     STOP taking these  medications    Advil 200 MG tablet Generic drug: ibuprofen   Aleve 220 MG tablet Generic drug: naproxen sodium   GOODY HEADACHE PO   lidocaine 5 % Commonly known as: Lidoderm   multivitamin with minerals Tabs tablet   ondansetron 4 MG tablet Commonly known as: ZOFRAN   PriLOSEC OTC 20 MG tablet Generic drug: omeprazole       TAKE these medications    amLODipine 10 MG tablet Commonly known as: NORVASC Take 1 tablet (10 mg total) by mouth daily.   benzonatate 100 MG capsule Commonly known as: TESSALON Take 1 capsule (100 mg total) by mouth 3 (three) times daily as needed for cough. What changed:  when to take this reasons to take this   pantoprazole 40 MG tablet Commonly known as: PROTONIX Take 1 tablet (40 mg total) by mouth daily. What changed:  medication strength how much to take        Follow-up Information     [facility] Follow up.   Why: You are scheduled for a hospital follow up on Friday, 09/26/22 at 1040.  The office did not have any afternoon apartments.  Please call the office if you need to reschedule your appointment. Contact information: [address], [address] [place] [address]  Discharge Exam: Filed Weights   09/18/22 1627 09/22/22 0551  Weight: 106.6 kg 110.9 kg   General exam: Appears calm and comfortable  Respiratory system: Clear to auscultation. Respiratory effort normal. Cardiovascular system: S1 & S2 heard, RRR. No JVD, murmurs, rubs, gallops or clicks. No pedal edema. Gastrointestinal system: Abdomen is nondistended, soft and nontender. No organomegaly or masses felt. Normal bowel sounds heard. Central nervous system: Alert and oriented. No focal neurological deficits. Extremities: Symmetric 5 x 5 power. Skin: No rashes, lesions or ulcers Psychiatry: Judgement and insight appear normal. Mood & affect appropriate.    Condition at discharge: fair  The results of  significant diagnostics from this hospitalization (including imaging, microbiology, ancillary and laboratory) are listed below for reference.   Imaging Studies: DG Chest 2 View  Result Date: 09/22/2022 CLINICAL DATA:  Cough EXAM: CHEST - 2 VIEW COMPARISON:  Chest radiograph dated March 02, 2020 FINDINGS: The heart size and mediastinal contours are within normal limits. Both lungs are clear. The visualized skeletal structures are unremarkable. IMPRESSION: No active cardiopulmonary disease. Electronically Signed   By: Larose Hires D.O.   On: 09/22/2022 18:55   CT ABDOMEN PELVIS W CONTRAST  Result Date: 09/18/2022 CLINICAL DATA:  Acute generalized abdominal pain, nausea, vomiting. EXAM: CT ABDOMEN AND PELVIS WITH CONTRAST TECHNIQUE: Multidetector CT imaging of the abdomen and pelvis was performed using the standard protocol following bolus administration of intravenous contrast. RADIATION DOSE REDUCTION: This exam was performed according to the departmental dose-optimization program which includes automated exposure control, adjustment of the mA and/or kV according to patient size and/or use of iterative reconstruction technique. CONTRAST:  OMNIPAQUE IOHEXOL 300 MG/ML  SOLN COMPARISON:  March 02, 2020. FINDINGS: Lower chest: No acute abnormality. Hepatobiliary: No focal liver abnormality is seen. No gallstones, gallbladder wall thickening, or biliary dilatation. Pancreas: Unremarkable. No pancreatic ductal dilatation or surrounding inflammatory changes. Spleen: Normal in size without focal abnormality. Adrenals/Urinary Tract: Adrenal glands are unremarkable. Kidneys are normal, without renal calculi, focal lesion, or hydronephrosis. Bladder is unremarkable. Stomach/Bowel: Stomach is within normal limits. Appendix appears normal. No evidence of bowel wall thickening, distention, or inflammatory changes. Vascular/Lymphatic: No significant vascular findings are present. No enlarged abdominal or pelvic  lymph nodes. Reproductive: Prostate is unremarkable. Other: No abdominal wall hernia or abnormality. No abdominopelvic ascites. Musculoskeletal: No acute or significant osseous findings. IMPRESSION: No definite abnormality seen in the abdomen or pelvis. Electronically Signed   By: Lupita Raider M.D.   On: 09/18/2022 18:39    Microbiology: Results for orders placed or performed during the hospital encounter of 09/18/22  SARS Coronavirus 2 by RT PCR (hospital order, performed in [facility] hospital lab) *cepheid single result test* Anterior Nasal Swab     Status: None   Collection Time: 09/18/22  4:31 PM   Specimen: Anterior Nasal Swab  Result Value Ref Range Status   SARS Coronavirus 2 by RT PCR NEGATIVE NEGATIVE Final    Comment: (NOTE) SARS-CoV-2 target nucleic acids are NOT DETECTED.  The SARS-CoV-2 RNA is generally detectable in upper and lower respiratory specimens during the acute phase of infection. The lowest concentration of SARS-CoV-2 viral copies this assay can detect is 250 copies / mL. A negative result does not preclude SARS-CoV-2 infection and should not be used as the sole basis for treatment or other patient management decisions.  A negative result may occur with improper specimen collection / handling, submission of specimen other than nasopharyngeal swab, presence of viral mutation(s) within the  areas targeted by this assay, and inadequate number of viral copies (<250 copies / mL). A negative result must be combined with clinical observations, patient history, and epidemiological information.  Fact Sheet for Patients:   RoadLapTop.co.za  Fact Sheet for Healthcare Providers: http://kim-miller.com/  This test is not yet approved or  cleared by the [place] FDA and has been authorized for detection and/or diagnosis of SARS-CoV-2 by FDA under an Emergency Use Authorization (EUA).  This EUA will remain in effect  (meaning this test can be used) for the duration of the COVID-19 declaration under Section 564(b)(1) of the Act, 21 U.S.C. section 360bbb-3(b)(1), unless the authorization is terminated or revoked sooner.  Performed at [facility], [address] [place], [place] [address]   Blood culture (routine x 2)     Status: None   Collection Time: 09/18/22  5:15 PM   Specimen: BLOOD  Result Value Ref Range Status   Specimen Description   Final    BLOOD RIGHT ANTECUBITAL Performed at [facility], [address] [place], [place] [address]    Special Requests   Final    BOTTLES DRAWN AEROBIC AND ANAEROBIC Blood Culture adequate volume Performed at [facility], [address] [place], [place] [address]    Culture   Final    NO GROWTH 5 DAYS Performed at [facility], [address], [place], [place] [address]    Report Status 09/23/2022 FINAL  Final  Blood culture (routine x 2)     Status: None   Collection Time: 09/18/22  5:25 PM   Specimen: BLOOD RIGHT HAND  Result Value Ref Range Status   Specimen Description   Final    BLOOD RIGHT HAND Performed at [facility], [address] , [place] [address]    Special Requests   Final    BOTTLES DRAWN AEROBIC AND ANAEROBIC Blood Culture adequate volume Performed at [facility], [address] [place], [place] [address]    Culture   Final    NO GROWTH 5 DAYS Performed at [facility], [address] [place], [place] [address]    Report Status 09/23/2022 FINAL  Final  C Difficile Quick Screen w PCR reflex     Status: None   Collection Time: 09/20/22 11:31 PM   Specimen: STOOL  Result Value Ref Range Status   C Diff antigen NEGATIVE NEGATIVE Final   C Diff toxin NEGATIVE NEGATIVE Final   C Diff interpretation No C. difficile detected.  Final    Comment: Performed at [facility], [address], [place], [place] [address]    Labs: CBC: Recent Labs   Lab 09/19/22 2043  WBC 6.7  HGB 14.5  HCT 41.7  MCV 87.4  PLT 232   Basic Metabolic Panel: Recent Labs  Lab 09/19/22 2043  CREATININE 0.98   Liver Function Tests: No results for input(s): "AST", "ALT", "ALKPHOS", "BILITOT", "PROT", "ALBUMIN" in the last 168 hours. CBG: No results for input(s): "GLUCAP" in the last 168 hours.  Discharge time spent: 39 minutes.   Signed: Kathlen Mody, MD [facility]

## 2022-10-07 ENCOUNTER — Ambulatory Visit: Payer: Medicaid Other | Admitting: Family Medicine

## 2022-10-08 ENCOUNTER — Encounter: Payer: Self-pay | Admitting: Nurse Practitioner

## 2022-10-23 ENCOUNTER — Emergency Department (HOSPITAL_COMMUNITY)
Admission: EM | Admit: 2022-10-23 | Discharge: 2022-10-24 | Disposition: A | Discharge status: Home / Self Care | Payer: BC Managed Care – PPO | Source: Home / Self Care | Attending: Emergency Medicine | Admitting: Emergency Medicine

## 2022-10-23 ENCOUNTER — Encounter (HOSPITAL_COMMUNITY): Payer: Self-pay

## 2022-10-23 ENCOUNTER — Other Ambulatory Visit: Payer: Self-pay

## 2022-10-23 DIAGNOSIS — F1090 Alcohol use, unspecified, uncomplicated: Secondary | ICD-10-CM | POA: Insufficient documentation

## 2022-10-23 DIAGNOSIS — Z79899 Other long term (current) drug therapy: Secondary | ICD-10-CM | POA: Diagnosis not present

## 2022-10-23 DIAGNOSIS — J45909 Unspecified asthma, uncomplicated: Secondary | ICD-10-CM | POA: Diagnosis not present

## 2022-10-23 DIAGNOSIS — F32A Depression, unspecified: Secondary | ICD-10-CM | POA: Diagnosis not present

## 2022-10-23 LAB — CBC
HCT: 44 % (ref 39.0–52.0)
Hemoglobin: 15 g/dL (ref 13.0–17.0)
MCH: 30.9 pg (ref 26.0–34.0)
MCHC: 34.1 g/dL (ref 30.0–36.0)
MCV: 90.5 fL (ref 80.0–100.0)
Platelets: 215 10*3/uL (ref 150–400)
RBC: 4.86 MIL/uL (ref 4.22–5.81)
RDW: 14.6 % (ref 11.5–15.5)
WBC: 10 10*3/uL (ref 4.0–10.5)
nRBC: 0 % (ref 0.0–0.2)

## 2022-10-23 NOTE — ED Triage Notes (Signed)
Says he relapsed on cocaine and has been drinking for last 2 days.   Seeking detox.   Struggling with depression from life stressors. Denies suicidal ideation.

## 2022-10-24 DIAGNOSIS — F1090 Alcohol use, unspecified, uncomplicated: Secondary | ICD-10-CM | POA: Diagnosis not present

## 2022-10-24 MED ORDER — ONDANSETRON 8 MG PO TBDP
8.0000 mg | ORAL_TABLET | Freq: Once | ORAL | Status: AC
  Administered 2022-10-24: 8 mg via ORAL
  Filled 2022-10-24: qty 1

## 2022-10-24 MED ORDER — LORAZEPAM 1 MG PO TABS
1.0000 mg | ORAL_TABLET | Freq: Once | ORAL | Status: AC
  Administered 2022-10-24: 1 mg via ORAL
  Filled 2022-10-24: qty 1

## 2022-10-24 NOTE — ED Notes (Signed)
Pt stated that he could not urinate.    

## 2022-10-24 NOTE — ED Notes (Signed)
Beverage given to pt, per RN.

## 2022-10-24 NOTE — ED Provider Notes (Signed)
[place] EMERGENCY DEPARTMENT AT [facility] Provider Note   CSN: [id number] Arrival date & time: 10/23/22  2229     History  Chief complaint-seeking detox   Francisco Bates is a 57 y.o. male.  The history is provided by the patient.  Patient with history of alcohol use disorder presents with seeking detox.  Patient reports he recently relapsed on cocaine and has had increased alcohol use.  He reports feeling depressed but no SI is reported. He has no pain complaints.     Past Medical History:  Diagnosis Date   Asthma    as child - no problems since   Patellar tendon rupture    RT knee   Rash    back   Recovering alcoholic (HCC)     Home Medications Prior to Admission medications   Medication Sig Start Date End Date Taking? Authorizing Provider  amLODipine (NORVASC) 10 MG tablet Take 1 tablet (10 mg total) by mouth daily. 09/24/22   Kathlen Mody, MD  pantoprazole (PROTONIX) 40 MG tablet Take 1 tablet (40 mg total) by mouth daily. 09/23/22   Kathlen Mody, MD      Allergies    Patient has no known allergies.    Review of Systems   Review of Systems  Psychiatric/Behavioral:  Positive for dysphoric mood.     Physical Exam Updated Vital Signs BP 119/79   Pulse 85   Temp 98.8 F (37.1 C) (Oral)   Resp 18   Ht 1.803 m (5\' 11" )   Wt 111.1 kg   SpO2 96%   BMI 34.17 kg/m  Physical Exam CONSTITUTIONAL: Well developed/well nourished, anxious HEAD: Normocephalic/atraumatic EYES: EOMI/PERRL ENMT: Mucous membranes moist NECK: supple no meningeal signs CV: S1/S2 noted, no murmurs/rubs/gallops noted LUNGS: Lungs are clear to auscultation bilaterally, no apparent distress NEURO: Pt is awake/alert/appropriate, moves all extremitiesx4.  No facial droop.  Walking around the room EXTREMITIES: full ROM SKIN: warm, color normal PSYCH: Anxious and pacing around the room  ED Results / Procedures / Treatments   Labs (all labs ordered are listed, but only  abnormal results are displayed) Labs Reviewed  COMPREHENSIVE METABOLIC PANEL - Abnormal; Notable for the following components:      Result Value   Potassium 3.1 (*)    Glucose, Bld 133 (*)    Calcium 8.4 (*)    Total Protein 8.5 (*)    AST 125 (*)    Total Bilirubin 1.5 (*)    All other components within normal limits  RAPID URINE DRUG SCREEN, HOSP PERFORMED - Abnormal; Notable for the following components:   Cocaine POSITIVE (*)    All other components within normal limits  ETHANOL  CBC    EKG None  Radiology No results found.  Procedures Procedures    Medications Ordered in ED Medications  ondansetron (ZOFRAN-ODT) disintegrating tablet 8 mg (8 mg Oral Given 10/24/22 0458)  LORazepam (ATIVAN) tablet 1 mg (1 mg Oral Given 10/24/22 0458)    ED Course/ Medical Decision Making/ A&P Clinical Course as of 10/24/22 0547  Fri Oct 24, 2022  4098 Patient presents seeking detox due to increased alcohol use and cocaine use.  Patient was admitted and discharged to the hospital approximately 1 month ago for dehydration.  Reports increasing his alcohol intake since that time.  Patient does have a previous history of delirium tremens per records.  Will monitor in the ER [DW]  0546 Patient resting comfortably.  Vitals are improved.  No active vomiting.  Labs are overall unremarkable No significant signs of alcohol withdrawal at this time.  Patient did not voice suicidal ideation. There is no indication for admission at this time [DW]  0547 Will discharge home patient with resources and information for the behavioral health urgent care. [DW]    Clinical Course User Index [DW] Zadie Rhine, MD                                 Medical Decision Making Amount and/or Complexity of Data Reviewed Labs: ordered.  Risk Prescription drug management.           Final Clinical Impression(s) / ED Diagnoses Final diagnoses:  Alcohol use disorder    Rx / DC Orders ED Discharge  Orders     None         Zadie Rhine, MD 10/24/22 [phone]

## 2022-10-27 ENCOUNTER — Ambulatory Visit (INDEPENDENT_AMBULATORY_CARE_PROVIDER_SITE_OTHER): Payer: BC Managed Care – PPO | Admitting: Family

## 2022-10-27 ENCOUNTER — Encounter: Payer: Self-pay | Admitting: Family

## 2022-10-27 VITALS — BP 137/89 | HR 86 | Temp 98.4°F | Ht 71.0 in | Wt 240.8 lb

## 2022-10-27 DIAGNOSIS — F1090 Alcohol use, unspecified, uncomplicated: Secondary | ICD-10-CM | POA: Diagnosis not present

## 2022-10-27 DIAGNOSIS — R1310 Dysphagia, unspecified: Secondary | ICD-10-CM

## 2022-10-27 DIAGNOSIS — K120 Recurrent oral aphthae: Secondary | ICD-10-CM | POA: Diagnosis not present

## 2022-10-27 MED ORDER — DEXAMETHASONE 0.5 MG/5ML PO ELIX
1.0000 mg | ORAL_SOLUTION | Freq: Every day | ORAL | 0 refills | Status: DC
Start: 2022-10-27 — End: 2022-11-07

## 2022-10-27 NOTE — Progress Notes (Signed)
Patient ID: EURA RUEDA, male    DOB: [birth year]  MRN: [id number]  CC: Emergency Department Follow-Up  Subjective: Jud Fabregas is a 57 y.o. male who presents for Emergency Department Follow-Up.  His concerns today include:  10/23/2022 [facility] Emergency Department per MD note: ED Course/ Medical Decision Making/ A&P    Clinical Course as of 10/24/22 0547  Fri Oct 24, 2022  6578 Patient presents seeking detox due to increased alcohol use and cocaine use.  Patient was admitted and discharged to the hospital approximately 1 month ago for dehydration.  Reports increasing his alcohol intake since that time.  Patient does have a previous history of delirium tremens per records.  Will monitor in the ER [DW]  0546 Patient resting comfortably.  Vitals are improved.  No active vomiting.  Labs are overall unremarkable No significant signs of alcohol withdrawal at this time.  Patient did not voice suicidal ideation. There is no indication for admission at this time [DW]  0547 Will discharge home patient with resources and information for the behavioral health urgent care. [DW]   Follow-Ups: Go to [facility] (Urgent Care) on 10/24/2022  Today's office visit 10/27/2022: - Reports he has an appointment with [facility] (Urgent Care) on tomorrow. He denies thoughts of self-harm, suicidal ideations, homicidal ideations. - Reports in the past he was told he needs an endoscopy due to difficulty swallowing. He denies associated red flag symptoms.  - Reports canker sore of tongue. Reports using over-the-counter Peroxide.  - No further issues/concerns for discussion today.    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 30 tablet 3   pantoprazole (PROTONIX) 40 MG tablet Take 1 tablet (40 mg total) by mouth daily. 60 tablet 1   No current facility-administered medications on file prior to visit.    No Known Allergies  Social History   Socioeconomic History   Marital status: Divorced    Spouse name: Not on file   Number of children: Not on file   Years of education: Not on file   Highest education level: Not on file  Occupational History   Not on file  Tobacco Use   Smoking status: Never   Smokeless tobacco: Never  Vaping Use   Vaping status: Never Used  Substance and Sexual Activity   Alcohol use: No    Comment: quit in 08/1974   Drug use: No   Sexual activity: Not on file  Other Topics Concern   Not on file  Social History Narrative   Not on file   Social Determinants of Health   Financial Resource Strain: Not on file  Food Insecurity: No Food Insecurity (09/19/2022)   Hunger Vital Sign    Worried About Running Out of Food in the Last Year: Never true    Ran Out of Food in the Last Year: Never true  Transportation Needs: No Transportation Needs (09/19/2022)   PRAPARE -  Administrator, Civil Service (Medical): No    Lack of Transportation (Non-Medical): No  Physical Activity: Not on file  Stress: Not on file  Social Connections: Not on file  Intimate Partner Violence: Not At Risk (09/19/2022)   Humiliation, Afraid, Rape, and Kick questionnaire    Fear of Current or Ex-Partner: No    Emotionally Abused: No    Physically Abused: No    Sexually Abused: No    Family History  Problem Relation Age of Onset   Hypertension Other    Cancer Mother    Cancer Father     Past Surgical  History:  Procedure Laterality Date   ACHILLES TENDON SURGERY     KNEE SURGERY     1995   PATELLAR TENDON REPAIR Right 11/15/2012   Procedure: OPEN REPAIR RIGHT PATELLA TENDON ;  Surgeon: [name], MD;  Location: [facility];  Service: Orthopedics;  Laterality: Right;    ROS: Review of Systems Negative except as stated above  PHYSICAL EXAM: BP 137/89   Pulse 86   Temp 98.4 F (36.9 C) (Oral)   Ht 5\' 11"  (1.803 m)   Wt 240 lb 12.8 oz (109.2 kg)   SpO2 97%   BMI 33.58 kg/m   Physical Exam HENT:     Head: Normocephalic and atraumatic.     Nose: Nose normal.     Mouth/Throat:     Mouth: Mucous membranes are moist.     Pharynx: Oropharynx is clear.     Comments: Canker sore right side of tongue, no additional presentation.  Eyes:     Extraocular Movements: Extraocular movements intact.     Conjunctiva/sclera: Conjunctivae normal.     Pupils: Pupils are equal, round, and reactive to light.  Cardiovascular:     Rate and Rhythm: Normal rate and regular rhythm.     Pulses: Normal pulses.     Heart sounds: Normal heart sounds.  Musculoskeletal:        General: Normal range of motion.     Cervical back: Normal range of motion and neck supple.  Neurological:     General: No focal deficit present.     Mental Status: He is alert and oriented to person, place, and time.  Psychiatric:        Mood and Affect: Mood normal.        Behavior: Behavior normal.     ASSESSMENT AND PLAN: 1. Alcohol use disorder - Patient denies thoughts of self-harm, suicidal ideations, homicidal ideations. - Keep all scheduled appointments at [facility] (Urgent Care).  - Referral to Psychiatry for further evaluation/management. During the interim follow-up with primary provider as scheduled until established with referral. - Ambulatory referral to Psychiatry  2. Dysphagia, unspecified type - Referral to Gastroenterology for further evaluation/management.  -  Ambulatory referral to Gastroenterology  3. Canker sores oral - Dexamethasone Elixir as prescribed. Counseled on medication adherence/adverse effects.  - Follow-up with primary provider as scheduled.  - dexamethasone 0.5 MG/5ML elixir; Take 10 mLs (1 mg total) by mouth daily.  Dispense: 100 mL; Refill: 0    Patient was given the opportunity to ask questions.  Patient verbalized understanding of the plan and was able to repeat key elements of the plan. Patient was given clear instructions to go to Emergency Department or return to medical center if symptoms don't improve, worsen, or new problems develop.The patient verbalized understanding.   Orders Placed This Encounter  Procedures   Ambulatory  referral to Psychiatry   Ambulatory referral to Gastroenterology     Requested Prescriptions   Signed Prescriptions Disp Refills   dexamethasone 0.5 MG/5ML elixir 100 mL 0    Sig: Take 10 mLs (1 mg total) by mouth daily.    Follow-up with primary provider as scheduled.   Rema Fendt, NP

## 2022-11-07 ENCOUNTER — Ambulatory Visit
Admission: EM | Admit: 2022-11-07 | Discharge: 2022-11-07 | Disposition: A | Discharge status: Home / Self Care | Payer: BC Managed Care – PPO | Attending: Physician Assistant | Admitting: Physician Assistant

## 2022-11-07 DIAGNOSIS — M25511 Pain in right shoulder: Secondary | ICD-10-CM

## 2022-11-07 MED ORDER — PREDNISONE 20 MG PO TABS: 40.0000 mg | ORAL_TABLET | Freq: Every day | ORAL | 0 refills | Status: AC

## 2022-11-07 NOTE — ED Provider Notes (Signed)
[name]    CSN: [id number] Arrival date & time: 11/07/22  1016      History   Chief Complaint Chief Complaint  Patient presents with   Arm Injury    Right    HPI Francisco Bates is a 57 y.o. male.   Patient here today for evaluation of right shoulder pain that started 2 nights ago.  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  He has not had any numbness or tingling.  He states that movement of his shoulder causes more pain, specifically anything above shoulder height.  The history is provided by the patient.  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 Authorizing Provider  amLODipine (NORVASC) 10 MG tablet Take 1 tablet (10 mg total) by mouth daily. 09/24/22  Yes Kathlen Mody, MD  pantoprazole (PROTONIX) 40 MG tablet Take 1 tablet (40 mg total) by mouth daily. 09/23/22  Yes Kathlen Mody, MD  predniSONE (DELTASONE) 20 MG  tablet Take 2 tablets (40 mg total) by mouth daily with breakfast for 5 days. 11/07/22 11/12/22 Yes Tomi Bamberger, PA-C    Family History Family History  Problem Relation Age of Onset   Hypertension Other    Cancer Mother    Cancer Father     Social History Social History   Tobacco Use   Smoking status: Never   Smokeless tobacco: Never  Vaping Use   Vaping status: Never Used  Substance Use Topics   Alcohol use: No    Comment: quit in 08/1974   Drug use: No     Allergies   Patient has no known allergies.   Review of Systems Review of Systems  Constitutional:  Negative for chills and fever.  Eyes:  Negative for discharge and redness.  Respiratory:  Negative for shortness of breath.   Gastrointestinal:  Negative for nausea and vomiting.  Musculoskeletal:  Positive for arthralgias and myalgias.  Neurological:  Negative for numbness.     Physical Exam Triage Vital Signs ED Triage Vitals  Encounter Vitals Group     BP 11/07/22 1036 115/80     Systolic BP Percentile --      Diastolic BP Percentile --      Pulse Rate 11/07/22 1036 91  Resp 11/07/22 1036 18     Temp 11/07/22 1036 98.1 F (36.7 C)     Temp Source 11/07/22 1036 Oral     SpO2 11/07/22 1036 99 %     Weight 11/07/22 1034 240 lb 11.9 oz (109.2 kg)     Height 11/07/22 1034 5\' 11"  (1.803 m)     Head Circumference --      Peak Flow --      Pain Score 11/07/22 1032 7     Pain Loc --      Pain Education --      Exclude from Growth Chart --    No data found.  Updated Vital Signs BP 115/80 (BP Location: Left Arm)   Pulse 91   Temp 98.1 F (36.7 C) (Oral)   Resp 18   Ht 5\' 11"  (1.803 m)   Wt 240 lb 11.9 oz (109.2 kg)   SpO2 99%   BMI 33.58 kg/m   Visual Acuity Right Eye Distance:   Left Eye Distance:   Bilateral Distance:    Right Eye Near:   Left Eye Near:    Bilateral Near:     Physical Exam Vitals and nursing note reviewed.  Constitutional:      General: He is not in acute  distress.    Appearance: Normal appearance. He is not ill-appearing.  HENT:     Head: Normocephalic and atraumatic.  Eyes:     Conjunctiva/sclera: Conjunctivae normal.  Cardiovascular:     Rate and Rhythm: Normal rate.  Pulmonary:     Effort: Pulmonary effort is normal. No respiratory distress.  Musculoskeletal:     Comments: Decreased range of motion of right shoulder due to pain.  Mild tenderness to palpation to anterior shoulder.  Skin:    Capillary Refill: Normal cap refill of right fingers distally Neurological:     Mental Status: He is alert.     Comments: Gross sensation intact to distal right fingers.  Psychiatric:        Mood and Affect: Mood normal.        Behavior: Behavior normal.        Thought Content: Thought content normal.      UC Treatments / Results  Labs (all labs ordered are listed, but only abnormal results are displayed) Labs Reviewed - No data to display  EKG   Radiology No results found.  Procedures Procedures (including critical care time)  Medications Ordered in UC Medications - No data to display  Initial Impression / Assessment and Plan / UC Course  I have reviewed the triage vital signs and the nursing notes.  Pertinent labs & imaging results that were available during my care of the patient were reviewed by me and considered in my medical decision making (see chart for details).   Steroid burst prescribed for suspected inflammatory joint pain.  Recommended follow-up with Ortho if no gradual improvement with any further concerns.   Final Clinical Impressions(s) / UC Diagnoses   Final diagnoses:  Acute pain of right shoulder   Discharge Instructions   None    ED Prescriptions     Medication Sig Dispense Auth. Provider   predniSONE (DELTASONE) 20 MG tablet Take 2 tablets (40 mg total) by mouth daily with breakfast for 5 days. 10 tablet Tomi Bamberger, PA-C      PDMP not reviewed this encounter.   Tomi Bamberger,  PA-C 11/07/22 1255

## 2022-11-07 NOTE — ED Triage Notes (Signed)
"  I am having a hard time lifting/moving my right arm". Started Wednesday evening "not after any injury known". Pain seems to be mostly in right shoulder extending down arm. No chest pain. No sob. No swelling/redness.

## 2022-12-16 ENCOUNTER — Telehealth (HOSPITAL_COMMUNITY): Payer: Self-pay

## 2022-12-22 ENCOUNTER — Encounter: Payer: Self-pay | Admitting: Nurse Practitioner

## 2022-12-22 ENCOUNTER — Ambulatory Visit (INDEPENDENT_AMBULATORY_CARE_PROVIDER_SITE_OTHER): Payer: BC Managed Care – PPO | Admitting: Nurse Practitioner

## 2022-12-22 ENCOUNTER — Other Ambulatory Visit (INDEPENDENT_AMBULATORY_CARE_PROVIDER_SITE_OTHER): Payer: BC Managed Care – PPO

## 2022-12-22 VITALS — BP 140/88 | HR 63 | Ht 71.0 in | Wt 242.0 lb

## 2022-12-22 DIAGNOSIS — R1310 Dysphagia, unspecified: Secondary | ICD-10-CM

## 2022-12-22 DIAGNOSIS — Z1211 Encounter for screening for malignant neoplasm of colon: Secondary | ICD-10-CM

## 2022-12-22 DIAGNOSIS — K219 Gastro-esophageal reflux disease without esophagitis: Secondary | ICD-10-CM

## 2022-12-22 LAB — CBC WITH DIFFERENTIAL/PLATELET
Basophils Absolute: 0.1 10*3/uL (ref 0.0–0.1)
Basophils Relative: 1.7 % (ref 0.0–3.0)
Eosinophils Absolute: 0.2 10*3/uL (ref 0.0–0.7)
Eosinophils Relative: 3.5 % (ref 0.0–5.0)
HCT: 46.1 % (ref 39.0–52.0)
Hemoglobin: 15.7 g/dL (ref 13.0–17.0)
Lymphocytes Relative: 41.8 % (ref 12.0–46.0)
Lymphs Abs: 2.2 10*3/uL (ref 0.7–4.0)
MCHC: 34.1 g/dL (ref 30.0–36.0)
MCV: 94.3 fL (ref 78.0–100.0)
Monocytes Absolute: 0.5 10*3/uL (ref 0.1–1.0)
Monocytes Relative: 9.8 % (ref 3.0–12.0)
Neutro Abs: 2.3 10*3/uL (ref 1.4–7.7)
Neutrophils Relative %: 43.2 % (ref 43.0–77.0)
Platelets: 252 10*3/uL (ref 150.0–400.0)
RBC: 4.89 Mil/uL (ref 4.22–5.81)
RDW: 15.9 % — ABNORMAL HIGH (ref 11.5–15.5)
WBC: 5.2 10*3/uL (ref 4.0–10.5)

## 2022-12-22 LAB — COMPREHENSIVE METABOLIC PANEL
ALT: 23 U/L (ref 0–53)
AST: 42 U/L — ABNORMAL HIGH (ref 0–37)
Albumin: 4.2 g/dL (ref 3.5–5.2)
Alkaline Phosphatase: 68 U/L (ref 39–117)
BUN: 5 mg/dL — ABNORMAL LOW (ref 6–23)
CO2: 25 meq/L (ref 19–32)
Calcium: 8.8 mg/dL (ref 8.4–10.5)
Chloride: 101 meq/L (ref 96–112)
Creatinine, Ser: 0.88 mg/dL (ref 0.40–1.50)
GFR: 96.96 mL/min (ref >60.00)
Glucose, Bld: 118 mg/dL — ABNORMAL HIGH (ref 70–99)
Potassium: 3.3 meq/L — ABNORMAL LOW (ref 3.5–5.1)
Sodium: 138 meq/L (ref 135–145)
Total Bilirubin: 0.9 mg/dL (ref 0.2–1.2)
Total Protein: 8 g/dL (ref 6.0–8.3)

## 2022-12-22 MED ORDER — PANTOPRAZOLE SODIUM 40 MG PO TBEC: 40.0000 mg | DELAYED_RELEASE_TABLET | Freq: Every day | ORAL | 1 refills | Status: DC

## 2022-12-22 MED ORDER — NA SULFATE-K SULFATE-MG SULF 17.5-3.13-1.6 GM/177ML PO SOLN: 1.0000 | Freq: Once | ORAL | 0 refills | Status: AC

## 2022-12-22 NOTE — Progress Notes (Unsigned)
12/22/2022 AMEDEO DETWEILER [id number] [birth year]   CHIEF COMPLAINT: Discuss scheduling an EGD and colonoscopy   HISTORY OF PRESENT ILLNESS: Tedd R. Carandang is a 57 year old male with a past medical history of depression and alcohol use disorder. He presents to our office today as referred by Dr. Kathlen Mody for further evaluation regarding alcohol associated gastritis and by Ricky Stabs NP for further evaluation regarding dysphagia.  He as admitted to the hospital 6/20 - 09/18/2022 with intractable nausea and vomiting with abdominal cramping with lactic acidosis.  CBC was normal.  ALT 57.  Normal lipase level.  CTAP was unrevealing.  He received IV fluids and PPI IV and his clinical status stabilized and lactic acidosis resolved.  He was discharged home on Pantoprazole 40 mg daily with recommendations for further GI evaluation as an outpatient.  He presented to the ED 10/23/2022 to detox in the setting of increased alcohol use and cocaine use.  Ethyl alcohol < 10. Urine drug screen positive for cocaine 10/23/2022. No suicidal ideation.  His clinical status was stabilized and he did not meet criteria for admission therefore he was discharged home with recommendations to follow-up with behavioral health.  He endorses having intermittent heartburn and brief dysphagia with solid foods.  He reported vomiting up a small amount of blood x 1 episode since he was discharged from the hospital 08/2022.  He previously drank four 24 oz Modelo beers 24 on Friday and Saturday evenings, recently decreased to three beers on Friday evenings.  No further cocaine use since he was seen in the ED 10/23/2022.  He denies ever having an EGD or colonoscopy.  Infrequent NSAID use.  He has been off work since his hospitalization 08/2022 and he wishes to return to work as soon as possible.      Latest Ref Rng & Units 10/23/2022   11:52 PM 09/19/2022    8:43 PM 09/18/2022    5:06 PM  CBC  WBC 4.0 - 10.5 K/uL 10.0  6.7  5.9    Hemoglobin 13.0 - 17.0 g/dL 01.0  27.2  53.6   Hematocrit 39.0 - 52.0 % 44.0  41.7  46.6   Platelets 150 - 400 K/uL 215  232  301        Latest Ref Rng & Units 10/23/2022   11:52 PM 09/19/2022    8:43 PM 09/18/2022    5:06 PM  CMP  Glucose 70 - 99 mg/dL 644   034   BUN 6 - 20 mg/dL 16   7   Creatinine 7.42 - 1.24 mg/dL 5.95  6.38  7.56   Sodium 135 - 145 mmol/L 140   137   Potassium 3.5 - 5.1 mmol/L 3.1   3.8   Chloride 98 - 111 mmol/L 101   101   CO2 22 - 32 mmol/L 26   23   Calcium 8.9 - 10.3 mg/dL 8.4   8.7   Total Protein 6.5 - 8.1 g/dL 8.5   8.7   Total Bilirubin 0.3 - 1.2 mg/dL 1.5   1.0   Alkaline Phos 38 - 126 U/L 71   75   AST 15 - 41 U/L 125   57   ALT 0 - 44 U/L 42   32     CTAP 09/18/2022:  FINDINGS: Lower chest: No acute abnormality.   Hepatobiliary: No focal liver abnormality is seen. No gallstones, gallbladder wall thickening, or biliary dilatation.   Pancreas: Unremarkable. No pancreatic ductal  dilatation or surrounding inflammatory changes.   Spleen: Normal in size without focal abnormality.   Adrenals/Urinary Tract: Adrenal glands are unremarkable. Kidneys are normal, without renal calculi, focal lesion, or hydronephrosis. Bladder is unremarkable.   Stomach/Bowel: Stomach is within normal limits. Appendix appears normal. No evidence of bowel wall thickening, distention, or inflammatory changes.   Vascular/Lymphatic: No significant vascular findings are present. No enlarged abdominal or pelvic lymph nodes.   Reproductive: Prostate is unremarkable.   Other: No abdominal wall hernia or abnormality. No abdominopelvic ascites.   Musculoskeletal: No acute or significant osseous findings.   IMPRESSION: No definite abnormality seen in the abdomen or pelvis.    Past Medical History:  Diagnosis Date   Asthma    as child - no problems since   Patellar tendon rupture    RT knee   Rash    back   Recovering alcoholic (HCC)    Past Surgical  History:  Procedure Laterality Date   ACHILLES TENDON SURGERY     KNEE SURGERY     1995   PATELLAR TENDON REPAIR Right 11/15/2012   Procedure: OPEN REPAIR RIGHT PATELLA TENDON ;  Surgeon: [name], MD;  Location: [facility];  Service: Orthopedics;  Laterality: Right;  Right ACL Surgery  Social History: Divorced. Nonsmoker. See HPI regarding alcohol and cocaine use.  Family History: Mother died from liver cancer. Maternal grandmother had cancer in her hip. No family history of DM or CAD.   No Known Allergies    Outpatient Encounter Medications as of 12/22/2022  Medication Sig   amLODipine (NORVASC) 10 MG tablet Take 1 tablet (10 mg total) by mouth daily.   pantoprazole (PROTONIX) 40 MG tablet Take 1 tablet (40 mg total) by mouth daily.   No facility-administered encounter medications on file as of 12/22/2022.   REVIEW OF SYSTEMS:  Gen: + Fatigue and night sweats. Denies fever or chills. No weight loss.  CV: Denies chest pain, palpitations or edema. Resp: + Cough.  GI: See HPI. GU: + Excessive urination and urine leakage  MS: Denies joint pain, muscles aches or weakness. Derm: Denies rash, itchiness, skin lesions or unhealing ulcers. Psych: Denies depression, anxiety, memory loss or confusion. Heme: Denies bruising, easy bleeding. Neuro:  Denies headaches, dizziness or paresthesias. Endo:  Denies any problems with DM, thyroid or adrenal function.  PHYSICAL EXAM: BP (!) 140/88   Pulse 63   Ht 5\' 11"  (1.803 m)   Wt 242 lb (109.8 kg)   BMI 33.75 kg/m   General: 57 year old male in no acute distress. Head: Normocephalic and atraumatic. Eyes:  Sclerae non-icteric, conjunctive pink. Ears: Normal auditory acuity. Mouth: Dentition intact. No ulcers or lesions.  Neck: Supple, no lymphadenopathy or thyromegaly.  Lungs: Clear bilaterally to auscultation without wheezes, crackles or rhonchi. Heart: Regular rate and rhythm. No murmur, rub or gallop appreciated.  Abdomen: Soft,  nontender, nondistended. No masses. No hepatosplenomegaly. Normoactive bowel sounds x 4 quadrants.  Rectal: Deferred. Musculoskeletal: Symmetrical with no gross deformities. Skin: Warm and dry. No rash or lesions on visible extremities. Extremities: No edema. Neurological: Alert oriented x 4, no focal deficits.  Psychological:  Alert and cooperative. Normal mood and affect.  ASSESSMENT AND PLAN:  57 year old male with GERD, vague intermittent dysphagia, hematemesis x 1 with suspected alcohol associated gastritis.  -Continue Pantoprazole 40 mg p.o. twice daily -EGD benefits and risks discussed including risk with sedation, risk of bleeding, perforation and infection  -CBC, CMP  Colon cancer screening -Colonoscopy benefits  and risks discussed including risk with sedation, risk of bleeding, perforation and infection   Alcohol use disorder, alcohol associated gastritis and elevated LFTs.  CTAP 08/2022 showed a normal liver. -Counseled no alcohol or cocaine use, recommended outpatient rehab -Labs as ordered above  If the patient's labs are stable and he is tolerating a solid diet without recurrent hematemesis he should be okay to to return to work from a GI standpoint.    Further recommendations to be determined after EGD and colonoscopy completed    CC:  Kathlen Mody, MD

## 2022-12-22 NOTE — Patient Instructions (Addendum)
You have been scheduled for an endoscopy and colonoscopy. Please follow the written instructions given to you at your visit today.  Please pick up your prep supplies at the pharmacy within the next 1-3 days.  If you use inhalers (even only as needed), please bring them with you on the day of your procedure.  DO NOT TAKE 7 DAYS PRIOR TO TEST- Trulicity (dulaglutide) Ozempic, Wegovy (semaglutide) Mounjaro (tirzepatide) Bydureon Bcise (exanatide extended release)  DO NOT TAKE 1 DAY PRIOR TO YOUR TEST Rybelsus (semaglutide) Adlyxin (lixisenatide) Victoza (liraglutide) Byetta (exanatide) ___________________________________________________________________________  Your provider has requested that you go to the basement level for lab work before leaving today. Press "B" on the elevator. The lab is located at the first door on the left as you exit the elevator.  No alcohol or drug use. Outpatient alcohol rehab recommended.  Due to recent changes in healthcare laws, you may see the results of your imaging and laboratory studies on MyChart before your provider has had a chance to review them.  We understand that in some cases there may be results that are confusing or concerning to you. Not all laboratory results come back in the same time frame and the provider may be waiting for multiple results in order to interpret others.  Please give [place] 48 hours in order for your provider to thoroughly review all the results before contacting the office for clarification of your results.   Thank you for trusting me with your gastrointestinal care!   Alcide Evener, CRNP

## 2022-12-23 ENCOUNTER — Encounter: Payer: Self-pay | Admitting: Nurse Practitioner

## 2022-12-23 NOTE — Progress Notes (Signed)
Agree with assessment and plan as outlined.  

## 2022-12-24 ENCOUNTER — Telehealth: Payer: Self-pay | Admitting: Nurse Practitioner

## 2022-12-24 NOTE — Telephone Encounter (Signed)
Patient is returning your call.  

## 2022-12-24 NOTE — Telephone Encounter (Signed)
Pt was made aware that Alcide Evener NP recommended the  letter to return to work to come from [employer] NP.  Pt verbalized understanding with all questions answered.

## 2022-12-24 NOTE — Telephone Encounter (Signed)
Spoke to pt. Documented in result notes.  Pt verbalized understanding with all questions answered.

## 2022-12-24 NOTE — Telephone Encounter (Signed)
Patient called back to request a letter to return to work. Please advise

## 2022-12-26 ENCOUNTER — Other Ambulatory Visit: Payer: Self-pay | Admitting: Family

## 2022-12-26 DIAGNOSIS — Z1211 Encounter for screening for malignant neoplasm of colon: Secondary | ICD-10-CM

## 2022-12-26 DIAGNOSIS — Z1212 Encounter for screening for malignant neoplasm of rectum: Secondary | ICD-10-CM

## 2023-01-08 ENCOUNTER — Ambulatory Visit: Payer: BC Managed Care – PPO | Admitting: Family

## 2023-01-09 ENCOUNTER — Ambulatory Visit (INDEPENDENT_AMBULATORY_CARE_PROVIDER_SITE_OTHER): Payer: BC Managed Care – PPO | Admitting: Family

## 2023-01-09 VITALS — BP 124/83 | HR 107 | Temp 98.4°F | Ht 71.0 in | Wt 232.6 lb

## 2023-01-09 DIAGNOSIS — Z0289 Encounter for other administrative examinations: Secondary | ICD-10-CM

## 2023-01-09 DIAGNOSIS — K219 Gastro-esophageal reflux disease without esophagitis: Secondary | ICD-10-CM | POA: Diagnosis not present

## 2023-01-09 DIAGNOSIS — R1310 Dysphagia, unspecified: Secondary | ICD-10-CM

## 2023-01-09 DIAGNOSIS — E876 Hypokalemia: Secondary | ICD-10-CM | POA: Diagnosis not present

## 2023-01-09 NOTE — Progress Notes (Signed)
Patient ID: Francisco Bates, male    DOB: 04-Sep-1965  MRN: [id number]  CC: Return To Work Letter  Subjective: Francisco Bates is a 57 y.o. male who presents for return to work Physicist, medical.  His concerns today include:  Patient reports he has been out of work since September 12, 2022 due to acid reflux, dysphagia, hematemesis, nausea, and vomiting. He is established with Gastroenterology and last office visit was on 12/22/2022. States Gastroenterology told him to come to Primary Care for return to work letter. Reports he is scheduled for a colonoscopy on January 21, 2023. States he is feeling back to normal and denies red flag symptoms. He is a full time employee at [employer].   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 30 tablet 3   pantoprazole (PROTONIX) 40 MG tablet Take 1 tablet (40 mg total) by mouth daily. Take 30 minutes before breakfast. 30 tablet 1   No current facility-administered medications on file prior to visit.    No Known Allergies  Social History   Socioeconomic History   Marital status: Divorced    Spouse name: Not on file   Number of children: Not on file   Years of education: Not on file   Highest education level: 12th grade  Occupational History   Not on file  Tobacco Use   Smoking status: Never   Smokeless tobacco: Never  Vaping Use   Vaping status: Never Used  Substance and Sexual Activity   Alcohol use: No    Comment: quit in 08/1974   Drug use: No    Sexual activity: Not Currently  Other Topics Concern   Not on file  Social History Narrative   Not on file   Social Determinants of Health   Financial Resource Strain: Low Risk  (01/09/2023)   Overall Financial Resource Strain (CARDIA)    Difficulty of Paying Living Expenses: Not hard at all  Food Insecurity: No Food Insecurity (01/09/2023)   Hunger Vital Sign    Worried About Running Out of Food in the Last Year: Never true    Ran Out of Food in the Last Year: Never true  Transportation Needs: No Transportation Needs (01/09/2023)   PRAPARE - Administrator, Civil Service (Medical): No    Lack of Transportation (Non-Medical): No  Physical Activity: Insufficiently Active (01/09/2023)   Exercise Vital Sign    Days of Exercise per Week: 4 days    Minutes of Exercise per Session: 30 min  Stress: No Stress Concern Present (01/09/2023)   [employer] of Occupational Health - Occupational Stress Questionnaire    Feeling of Stress : Not at all  Social Connections: Unknown (01/09/2023)   Social Connection and Isolation Panel [NHANES]    Frequency of Communication with Friends and Family: Twice a week    Frequency of Social Gatherings with Friends and Family: Patient declined    Attends Religious Services: Patient declined    Active Member  of Clubs or Organizations: No    Attends Banker Meetings: Not on file    Marital Status: Divorced  Intimate Partner Violence: Not At Risk (09/19/2022)   Humiliation, Afraid, Rape, and Kick questionnaire    Fear of Current or Ex-Partner: No    Emotionally Abused: No    Physically Abused: No    Sexually Abused: No    Family History  Problem Relation Age of Onset   Hypertension Other    Cancer Mother    Cancer Father     Past Surgical History:  Procedure Laterality Date   ACHILLES TENDON SURGERY     KNEE SURGERY     1995   PATELLAR TENDON REPAIR Right 11/15/2012   Procedure: OPEN REPAIR RIGHT PATELLA TENDON ;   Surgeon: [name], MD;  Location: [facility];  Service: Orthopedics;  Laterality: Right;    ROS: Review of Systems Negative except as stated above  PHYSICAL EXAM: BP 124/83   Pulse (!) 107   Temp 98.4 F (36.9 C) (Oral)   Ht 5\' 11"  (1.803 m)   Wt 232 lb 9.6 oz (105.5 kg)   SpO2 93%   BMI 32.44 kg/m   Physical Exam HENT:     Head: Normocephalic and atraumatic.     Nose: Nose normal.     Mouth/Throat:     Mouth: Mucous membranes are moist.     Pharynx: Oropharynx is clear.  Eyes:     Extraocular Movements: Extraocular movements intact.     Conjunctiva/sclera: Conjunctivae normal.     Pupils: Pupils are equal, round, and reactive to light.  Cardiovascular:     Rate and Rhythm: Normal rate and regular rhythm.     Pulses: Normal pulses.     Heart sounds: Normal heart sounds.  Pulmonary:     Effort: Pulmonary effort is normal.     Breath sounds: Normal breath sounds.  Abdominal:     General: Bowel sounds are normal.     Palpations: Abdomen is soft.  Musculoskeletal:        General: Normal range of motion.     Cervical back: Normal range of motion and neck supple.  Neurological:     General: No focal deficit present.     Mental Status: He is alert and oriented to person, place, and time.  Psychiatric:        Mood and Affect: Mood normal.        Behavior: Behavior normal.     ASSESSMENT AND PLAN: Note - I discussed plan of care with Georganna Skeans, MD.  1. Encounter for completion of form with patient 2. Dysphagia, unspecified type 3. Gastroesophageal reflux disease, unspecified whether esophagitis present - Patient's last office visit with Gastroenterology on 12/22/2022 and plan states "If the patient's labs are stable and he is tolerating a solid diet without recurrent hematemesis he should be okay to to return to work from a GI standpoint." - Today patient reports feeling back to normal and denies red flag symptoms.  - Patient provided with return to work letter.    4. Low blood potassium - Potassium mildly low at 3.3 mEq/L on 12/22/2022.  - Routine screening.  - Potassium; Future   Patient was given the opportunity to ask questions.  Patient verbalized understanding of the plan and was able to repeat key elements of the plan. Patient was given clear instructions to go to Emergency Department or return to medical center if symptoms don't improve, worsen, or new  problems develop.The patient verbalized understanding.  Follow-up with primary provider as scheduled.  Rema Fendt, NP

## 2023-01-09 NOTE — Progress Notes (Signed)
Patient states no concerns to discuss, needs letter from provider stating he can go back to work.    Declined Flu vaccine.

## 2023-01-16 ENCOUNTER — Other Ambulatory Visit: Payer: Self-pay

## 2023-01-16 ENCOUNTER — Encounter (HOSPITAL_COMMUNITY): Payer: Self-pay

## 2023-01-16 ENCOUNTER — Observation Stay (HOSPITAL_COMMUNITY)
Admission: EM | Admit: 2023-01-16 | Discharge: 2023-01-20 | Disposition: A | Discharge status: Home / Self Care | Payer: BC Managed Care – PPO | Attending: Internal Medicine | Admitting: Internal Medicine

## 2023-01-16 DIAGNOSIS — E876 Hypokalemia: Secondary | ICD-10-CM | POA: Diagnosis not present

## 2023-01-16 DIAGNOSIS — R443 Hallucinations, unspecified: Secondary | ICD-10-CM | POA: Diagnosis not present

## 2023-01-16 DIAGNOSIS — E669 Obesity, unspecified: Secondary | ICD-10-CM | POA: Insufficient documentation

## 2023-01-16 DIAGNOSIS — F29 Unspecified psychosis not due to a substance or known physiological condition: Secondary | ICD-10-CM | POA: Insufficient documentation

## 2023-01-16 DIAGNOSIS — J45909 Unspecified asthma, uncomplicated: Secondary | ICD-10-CM | POA: Insufficient documentation

## 2023-01-16 DIAGNOSIS — R441 Visual hallucinations: Secondary | ICD-10-CM | POA: Diagnosis not present

## 2023-01-16 DIAGNOSIS — R9431 Abnormal electrocardiogram [ECG] [EKG]: Secondary | ICD-10-CM | POA: Diagnosis not present

## 2023-01-16 DIAGNOSIS — I10 Essential (primary) hypertension: Secondary | ICD-10-CM | POA: Insufficient documentation

## 2023-01-16 DIAGNOSIS — Z79899 Other long term (current) drug therapy: Secondary | ICD-10-CM | POA: Insufficient documentation

## 2023-01-16 DIAGNOSIS — Z789 Other specified health status: Secondary | ICD-10-CM | POA: Diagnosis present

## 2023-01-16 DIAGNOSIS — Z6833 Body mass index (BMI) 33.0-33.9, adult: Secondary | ICD-10-CM | POA: Diagnosis not present

## 2023-01-16 LAB — CBC WITH DIFFERENTIAL/PLATELET
Abs Immature Granulocytes: 0.03 10*3/uL (ref 0.00–0.07)
Basophils Absolute: 0 10*3/uL (ref 0.0–0.1)
Basophils Relative: 1 %
Eosinophils Absolute: 0.2 10*3/uL (ref 0.0–0.5)
Eosinophils Relative: 3 %
HCT: 39.6 % (ref 39.0–52.0)
Hemoglobin: 13.8 g/dL (ref 13.0–17.0)
Immature Granulocytes: 1 %
Lymphocytes Relative: 26 %
Lymphs Abs: 1.6 10*3/uL (ref 0.7–4.0)
MCH: 32.2 pg (ref 26.0–34.0)
MCHC: 34.8 g/dL (ref 30.0–36.0)
MCV: 92.5 fL (ref 80.0–100.0)
Monocytes Absolute: 0.8 10*3/uL (ref 0.1–1.0)
Monocytes Relative: 14 %
Neutro Abs: 3.3 10*3/uL (ref 1.7–7.7)
Neutrophils Relative %: 55 %
Platelets: 324 10*3/uL (ref 150–400)
RBC: 4.28 MIL/uL (ref 4.22–5.81)
RDW: 13.2 % (ref 11.5–15.5)
WBC: 5.9 10*3/uL (ref 4.0–10.5)
nRBC: 0 % (ref 0.0–0.2)

## 2023-01-16 LAB — RAPID URINE DRUG SCREEN, HOSP PERFORMED
Amphetamines: NOT DETECTED
Barbiturates: NOT DETECTED
Benzodiazepines: NOT DETECTED
Cocaine: POSITIVE — AB
Opiates: NOT DETECTED
Tetrahydrocannabinol: NOT DETECTED

## 2023-01-16 LAB — COMPREHENSIVE METABOLIC PANEL
ALT: 32 U/L (ref 0–44)
AST: 67 U/L — ABNORMAL HIGH (ref 15–41)
Albumin: 3.3 g/dL — ABNORMAL LOW (ref 3.5–5.0)
Alkaline Phosphatase: 53 U/L (ref 38–126)
Anion gap: 16 — ABNORMAL HIGH (ref 5–15)
BUN: 9 mg/dL (ref 6–20)
CO2: 29 mmol/L (ref 22–32)
Calcium: 8.9 mg/dL (ref 8.9–10.3)
Chloride: 93 mmol/L — ABNORMAL LOW (ref 98–111)
Creatinine, Ser: 0.82 mg/dL (ref 0.61–1.24)
GFR, Estimated: >60 mL/min (ref >60)
Glucose, Bld: 80 mg/dL (ref 70–99)
Potassium: 2.6 mmol/L — CL (ref 3.5–5.1)
Sodium: 138 mmol/L (ref 135–145)
Total Bilirubin: 1.4 mg/dL — ABNORMAL HIGH (ref 0.3–1.2)
Total Protein: 7.4 g/dL (ref 6.5–8.1)

## 2023-01-16 LAB — ETHANOL: Alcohol, Ethyl (B): <10 mg/dL (ref <10)

## 2023-01-16 MED ORDER — POTASSIUM CHLORIDE CRYS ER 20 MEQ PO TBCR
40.0000 meq | EXTENDED_RELEASE_TABLET | Freq: Once | ORAL | Status: AC
  Administered 2023-01-16: 40 meq via ORAL
  Filled 2023-01-16: qty 2

## 2023-01-16 MED ORDER — POTASSIUM CHLORIDE 10 MEQ/100ML IV SOLN
10.0000 meq | INTRAVENOUS | Status: AC
  Administered 2023-01-16 (×2): 10 meq via INTRAVENOUS
  Filled 2023-01-16 (×2): qty 100

## 2023-01-16 NOTE — ED Provider Notes (Addendum)
[place] EMERGENCY DEPARTMENT AT [facility] Provider Note   CSN: [id number] Arrival date & time: 01/16/23  0422     History  Chief Complaint  Patient presents with   Hallucinations    Francisco Bates is a 57 y.o. male.  Patient presents voluntarily to the emergency room at requesting psychiatric evaluation due to visual hallucinations which began approximate 1 week ago.  Patient states he sees people in his house that are not there.  He denies any auditory hallucinations, suicidal ideations, homicidal ideations.  The patient does endorse drinking 2 24 ounce beers earlier in the day.  He denies any psychiatric history and denies any substance abuse.  HPI     Home Medications Prior to Admission medications   Medication Sig Start Date End Date Taking? Authorizing Provider  amLODipine (NORVASC) 10 MG tablet Take 1 tablet (10 mg total) by mouth daily. 09/24/22   Kathlen Mody, MD  pantoprazole (PROTONIX) 40 MG tablet Take 1 tablet (40 mg total) by mouth daily. Take 30 minutes before breakfast. 12/22/22   Arnaldo Natal, NP      Allergies    Patient has no known allergies.    Review of Systems   Review of Systems  Physical Exam Updated Vital Signs BP 128/78 (BP Location: Right Arm)   Pulse 92   Temp 97.7 F (36.5 C) (Oral)   Resp 16   Ht 5\' 11"  (1.803 m)   Wt 108 kg   SpO2 97%   BMI 33.19 kg/m  Physical Exam Vitals and nursing note reviewed.  HENT:     Head: Normocephalic and atraumatic.  Eyes:     Pupils: Pupils are equal, round, and reactive to light.  Pulmonary:     Effort: Pulmonary effort is normal. No respiratory distress.  Musculoskeletal:        General: No signs of injury.     Cervical back: Normal range of motion.  Skin:    General: Skin is dry.  Neurological:     Mental Status: He is alert.  Psychiatric:        Speech: Speech normal.     ED Results / Procedures / Treatments   Labs (all labs ordered are listed, but only  abnormal results are displayed) Labs Reviewed - No data to display  EKG None  Radiology No results found.  Procedures Procedures    Medications Ordered in ED Medications - No data to display  ED Course/ Medical Decision Making/ A&P                                 Medical Decision Making  Spoke with patient who is very sleepy upon my assessment.  Patient refused any labs upon my initial assessment.  Patient did endorse the visual hallucinations and once again reiterated that he had no suicidal ideations no homicidal ideations.  I explained to the patient that we need to medically clear the patient prior to attempting to get a psychiatric evaluation and the patient once again refused, stating "labs are different than talking to someone".  The patient fell back asleep after my assessment.  Plan to sign patient out to oncoming provider at shift handoff.  Once patient is awake and more alert the patient can be reassessed.  He may be willing to have lab work at that time or may no longer want evaluation at all.  At this time I see no  indication for involuntary commitment based on his current presentation as he does not seem to be a threat to himself or others.  I did speak with the patient's contact person over the phone who stated that the patient does have a remote history of depression with thoughts of self-harm but that to her best knowledge this was many years ago.  She states he has not endorsed any intent to hurt himself or others in recent history.        Final Clinical Impression(s) / ED Diagnoses Final diagnoses:  None    Rx / DC Orders ED Discharge Orders     None         Pamala Duffel 01/16/23 0628    Darrick Grinder, PA-C 01/16/23 5409    Zadie Rhine, MD 01/16/23 [phone]

## 2023-01-16 NOTE — Consult Note (Signed)
Checked on patient again and he remains asleep.

## 2023-01-16 NOTE — ED Provider Notes (Signed)
Patient is a handoff from [place], [place].  Plan, handoff is to continue repletion of hypokalemia and await TTS consult.  Likely discharge home with resources.  Not acutely withdrawing at this time. Physical Exam  BP (!) 145/76   Pulse 61   Temp 98.4 F (36.9 C)   Resp 18   Ht 5\' 11"  (1.803 m)   Wt 108 kg   SpO2 97%   BMI 33.19 kg/m   Physical Exam Vitals and nursing note reviewed. Exam conducted with a chaperone present.  HENT:     Head: Normocephalic and atraumatic.  Eyes:     Pupils: Pupils are equal, round, and reactive to light.  Pulmonary:     Effort: Pulmonary effort is normal. No respiratory distress.  Musculoskeletal:        General: No signs of injury.     Cervical back: Normal range of motion.  Skin:    General: Skin is dry.  Neurological:     Mental Status: He is alert.  Psychiatric:        Speech: Speech normal.     Procedures  Procedures  ED Course / MDM   Clinical Course as of 01/17/23 1505  Fri Jan 16, 2023  4401 Visual hallucinations, EtoH involved. Refusing labs. Asleep now. Here voluntarily. Potential TTS eval if willing to have labs drawn. If not, can potentially dc with outpatient resources.  [JR]  Sat Jan 17, 2023  0211 Psychiatry recommending inpatient management, however given profound hypokalemia prior to psychiatric evaluation requesting repeat potassium level.  Repeat level 2.6, will proceed with medical admission for severe hypokalemia for further medical stabilization prior to psychiatric admission. [RS]  0230 Consult to Dr. Janalyn Shy, hospitalist, who is agreeable to admitting this patient to her service. I appreciate her collaboration in the care of this patient.  [RS]    Clinical Course User Index [JR] Gareth Eagle, PA-C [RS] Sponseller, Eugene Gavia, PA-C   Medical Decision Making Amount and/or Complexity of Data Reviewed Labs: ordered.  Risk Prescription drug management. Decision regarding hospitalization.   Patient is a handoff  from [place], [place]. Please see his note for full HPI and physical exam findings. Plan, handoff is to continue repletion of hypokalemia and await TTS consult.  Likely discharge home with resources.  Not acutely withdrawing at this time.  Patient was noted withdrawn throughout most of my shift today.  Psychiatry attempted to reach out to patient to evaluate him for possible psych clearance but patient initially was unable to fully cooperate with assessment as he was too sleepy.  No acute indications of intoxication at this time so unsure if patient is having some sort of psychosis causing him to be withdrawn.  Reach out to psychiatry service to see if they would reassess him in the near term they report that he likely would not be able to be seen to later tonight by the telepsych service.  Informed patient's family of current status and plan. Unfortunately, patient care will be transferred to oncoming provider, Sponseller, PA-C given that he has not been psychiatrically cleared. Medically cleared but notable hypokalemia which has been corrected with IV and oral potassium. No other acute concerns at this time.       Smitty Knudsen, PA-C 01/17/23 1508    Maia Plan, MD 01/20/23 1110

## 2023-01-16 NOTE — BH Assessment (Signed)
[facility] provider has changed to Winfield Cunas, MD.   Harlin Rain Patsy [place], [facility], [facility] Triage Specialist

## 2023-01-16 NOTE — ED Provider Notes (Signed)
Accepted handoff at shift change from [facility]. Please see prior provider note for more detail.   Briefly: Patient is 57 y.o. presenting for visual hallucinations in setting of alcohol consumption.  DDX: concern for suicidal ideation, acute psychosis, alcohol intoxication, delirium tremens, other.  Plan: Reassess.  Offer labs again.  If medically cleared can placed in psych hold for TTS evaluation.  Patient is here voluntarily.   Physical Exam  BP (!) 145/76   Pulse 61   Temp 98.4 F (36.9 C)   Resp 18   Ht 5\' 11"  (1.803 m)   Wt 108 kg   SpO2 97%   BMI 33.19 kg/m   Physical Exam  Procedures  Procedures  ED Course / MDM   Clinical Course as of 01/19/23 0709  Fri Jan 16, 2023  9604 Visual hallucinations, EtoH involved. Refusing labs. Asleep now. Here voluntarily. Potential TTS eval if willing to have labs drawn. If not, can potentially dc with outpatient resources.  [JR]  Sat Jan 17, 2023  0211 Psychiatry recommending inpatient management, however given profound hypokalemia prior to psychiatric evaluation requesting repeat potassium level.  Repeat level 2.6, will proceed with medical admission for severe hypokalemia for further medical stabilization prior to psychiatric admission. [RS]  0230 Consult to Dr. Janalyn Shy, hospitalist, who is agreeable to admitting this patient to her service. I appreciate her collaboration in the care of this patient.  [RS]    Clinical Course User Index [JR] Gareth Eagle, PA-C [RS] Sponseller, Eugene Gavia, PA-C   Medical Decision Making Amount and/or Complexity of Data Reviewed Labs: ordered.  Risk Prescription drug management. Decision regarding hospitalization.   On reassessment, patient was still notably sleepy.  Labs revealed hypokalemia. Patient denies vomiting diarrhea. Suspect it could be related to his chronic alcohol use. Started IV and oral potassium.  Order TTS evaluation to be done here in the ED as he is not medically  cleared at this time. Signed out patient to Lisette Abu, PA.        Gareth Eagle, PA-C 01/19/23 5409    Wynetta Fines, MD 01/19/23 [phone]

## 2023-01-16 NOTE — BH Assessment (Signed)
TTS requested tele-psychiatry consult with [facility]. Created secure conversation including EDP, Pt's RN, and [facility] Coordinators to facilitate consult. Dr Dewitt Rota is scheduled to assess Pt at midnight.   Pamalee Leyden, [facility], [facility] Triage Specialist

## 2023-01-16 NOTE — BH Assessment (Signed)
Per RN, Pt is currently too somnolent to participate in tele-assessment.   Pamalee Leyden, [facility], [facility] Triage Specialist

## 2023-01-16 NOTE — Consult Note (Signed)
Attempted to assess patient; he remains too drowsy to participate in a psychiatric assessment.  Patient could not hold his eyes open or answer questions.

## 2023-01-16 NOTE — ED Triage Notes (Signed)
Pt arrives voluntarily from home with [employer]. Pt states he has been seeing people that are not there x 1 week. States they are threatening and he is scared of them. Pt denies SI/HI. Admits to drinking 2 x 24oz beers today that is not unusual for him, denies illicit drugs.

## 2023-01-16 NOTE — ED Notes (Signed)
Pt taken to room 36 for TTS assessment.

## 2023-01-17 ENCOUNTER — Encounter (HOSPITAL_COMMUNITY): Payer: Self-pay | Admitting: Internal Medicine

## 2023-01-17 DIAGNOSIS — F203 Undifferentiated schizophrenia: Secondary | ICD-10-CM | POA: Diagnosis not present

## 2023-01-17 DIAGNOSIS — E876 Hypokalemia: Principal | ICD-10-CM | POA: Diagnosis present

## 2023-01-17 DIAGNOSIS — Z789 Other specified health status: Secondary | ICD-10-CM | POA: Diagnosis present

## 2023-01-17 DIAGNOSIS — F29 Unspecified psychosis not due to a substance or known physiological condition: Secondary | ICD-10-CM | POA: Diagnosis present

## 2023-01-17 DIAGNOSIS — I10 Essential (primary) hypertension: Secondary | ICD-10-CM | POA: Diagnosis present

## 2023-01-17 LAB — PHOSPHORUS
Phosphorus: 2.1 mg/dL — ABNORMAL LOW (ref 2.5–4.6)
Phosphorus: 2.8 mg/dL (ref 2.5–4.6)

## 2023-01-17 LAB — BASIC METABOLIC PANEL
Anion gap: 9 (ref 5–15)
Anion gap: 9 (ref 5–15)
BUN: 8 mg/dL (ref 6–20)
BUN: 9 mg/dL (ref 6–20)
CO2: 28 mmol/L (ref 22–32)
CO2: 26 mmol/L (ref 22–32)
Calcium: 8.5 mg/dL — ABNORMAL LOW (ref 8.9–10.3)
Calcium: 8.8 mg/dL — ABNORMAL LOW (ref 8.9–10.3)
Chloride: 100 mmol/L (ref 98–111)
Chloride: 102 mmol/L (ref 98–111)
Creatinine, Ser: 0.78 mg/dL (ref 0.61–1.24)
Creatinine, Ser: 0.96 mg/dL (ref 0.61–1.24)
GFR, Estimated: >60 mL/min (ref >60)
GFR, Estimated: >60 mL/min (ref >60)
Glucose, Bld: 90 mg/dL (ref 70–99)
Glucose, Bld: 98 mg/dL (ref 70–99)
Potassium: 3.2 mmol/L — ABNORMAL LOW (ref 3.5–5.1)
Potassium: 3.2 mmol/L — ABNORMAL LOW (ref 3.5–5.1)
Sodium: 137 mmol/L (ref 135–145)
Sodium: 137 mmol/L (ref 135–145)

## 2023-01-17 LAB — POTASSIUM: Potassium: 2.6 mmol/L — CL (ref 3.5–5.1)

## 2023-01-17 LAB — MAGNESIUM
Magnesium: 1.9 mg/dL (ref 1.7–2.4)
Magnesium: 1.7 mg/dL (ref 1.7–2.4)

## 2023-01-17 LAB — HIV ANTIBODY (ROUTINE TESTING W REFLEX): HIV Screen 4th Generation wRfx: NONREACTIVE

## 2023-01-17 LAB — GLUCOSE, CAPILLARY: Glucose-Capillary: 120 mg/dL — ABNORMAL HIGH (ref 70–99)

## 2023-01-17 MED ORDER — ACETAMINOPHEN 650 MG RE SUPP: 650.0000 mg | Freq: Four times a day (QID) | RECTAL | Status: DC | PRN

## 2023-01-17 MED ORDER — POTASSIUM CHLORIDE 10 MEQ/100ML IV SOLN
10.0000 meq | INTRAVENOUS | Status: DC
  Filled 2023-01-17: qty 100

## 2023-01-17 MED ORDER — LORAZEPAM 2 MG/ML IJ SOLN: 1.0000 mg | INTRAMUSCULAR | Status: AC | PRN

## 2023-01-17 MED ORDER — AMLODIPINE BESYLATE 10 MG PO TABS
10.0000 mg | ORAL_TABLET | Freq: Every day | ORAL | Status: DC
  Administered 2023-01-17 – 2023-01-20 (×4): 10 mg via ORAL
  Filled 2023-01-17 (×2): qty 1
  Filled 2023-01-17: qty 2
  Filled 2023-01-17: qty 1

## 2023-01-17 MED ORDER — SODIUM PHOSPHATES 45 MMOLE/15ML IV SOLN: 30.0000 mmol | Freq: Once | INTRAVENOUS | Status: DC

## 2023-01-17 MED ORDER — PANTOPRAZOLE SODIUM 40 MG PO TBEC
40.0000 mg | DELAYED_RELEASE_TABLET | Freq: Every day | ORAL | Status: DC
  Administered 2023-01-17 – 2023-01-20 (×4): 40 mg via ORAL
  Filled 2023-01-17 (×4): qty 1

## 2023-01-17 MED ORDER — ACETAMINOPHEN 325 MG PO TABS: 650.0000 mg | ORAL_TABLET | ORAL | Status: DC | PRN

## 2023-01-17 MED ORDER — SODIUM CHLORIDE 0.9 % IV SOLN: 250.0000 mL | INTRAVENOUS | Status: AC | PRN

## 2023-01-17 MED ORDER — ADULT MULTIVITAMIN W/MINERALS CH
1.0000 | ORAL_TABLET | Freq: Every day | ORAL | Status: DC
Start: 2023-01-17 — End: 2023-01-20
  Administered 2023-01-17 – 2023-01-20 (×4): 1 via ORAL
  Filled 2023-01-17 (×4): qty 1

## 2023-01-17 MED ORDER — SENNOSIDES-DOCUSATE SODIUM 8.6-50 MG PO TABS
1.0000 | ORAL_TABLET | Freq: Every evening | ORAL | Status: DC | PRN
Start: 2023-01-17 — End: 2023-01-20

## 2023-01-17 MED ORDER — SODIUM PHOSPHATES 45 MMOLE/15ML IV SOLN
30.0000 mmol | Freq: Once | INTRAVENOUS | Status: AC
  Administered 2023-01-17: 30 mmol via INTRAVENOUS
  Filled 2023-01-17: qty 10

## 2023-01-17 MED ORDER — ACETAMINOPHEN 325 MG PO TABS: 650.0000 mg | ORAL_TABLET | Freq: Four times a day (QID) | ORAL | Status: DC | PRN

## 2023-01-17 MED ORDER — THIAMINE HCL 100 MG/ML IJ SOLN
100.0000 mg | Freq: Every day | INTRAMUSCULAR | Status: DC
Start: 2023-01-17 — End: 2023-01-20

## 2023-01-17 MED ORDER — ONDANSETRON HCL 4 MG PO TABS: 4.0000 mg | ORAL_TABLET | Freq: Four times a day (QID) | ORAL | Status: DC | PRN

## 2023-01-17 MED ORDER — PANTOPRAZOLE SODIUM 40 MG PO TBEC: 40.0000 mg | DELAYED_RELEASE_TABLET | Freq: Every day | ORAL | Status: DC

## 2023-01-17 MED ORDER — SODIUM CHLORIDE 0.9% FLUSH: 3.0000 mL | INTRAVENOUS | Status: DC | PRN

## 2023-01-17 MED ORDER — POTASSIUM CHLORIDE 20 MEQ PO PACK: 80.0000 meq | PACK | ORAL | Status: DC

## 2023-01-17 MED ORDER — FOLIC ACID 1 MG PO TABS
1.0000 mg | ORAL_TABLET | Freq: Every day | ORAL | Status: DC
  Administered 2023-01-17 – 2023-01-20 (×4): 1 mg via ORAL
  Filled 2023-01-17 (×4): qty 1

## 2023-01-17 MED ORDER — THIAMINE MONONITRATE 100 MG PO TABS
100.0000 mg | ORAL_TABLET | Freq: Every day | ORAL | Status: DC
  Administered 2023-01-17 – 2023-01-20 (×4): 100 mg via ORAL
  Filled 2023-01-17 (×4): qty 1

## 2023-01-17 MED ORDER — HYDRALAZINE HCL 20 MG/ML IJ SOLN: 10.0000 mg | Freq: Four times a day (QID) | INTRAMUSCULAR | Status: DC | PRN

## 2023-01-17 MED ORDER — LORAZEPAM 1 MG PO TABS: 1.0000 mg | ORAL_TABLET | ORAL | Status: AC | PRN

## 2023-01-17 MED ORDER — ENOXAPARIN SODIUM 40 MG/0.4ML IJ SOSY
40.0000 mg | PREFILLED_SYRINGE | INTRAMUSCULAR | Status: DC
Start: 2023-01-17 — End: 2023-01-20
  Administered 2023-01-17 – 2023-01-20 (×4): 40 mg via SUBCUTANEOUS
  Filled 2023-01-17 (×4): qty 0.4

## 2023-01-17 MED ORDER — OLANZAPINE 5 MG PO TBDP
10.0000 mg | ORAL_TABLET | Freq: Every day | ORAL | Status: DC
  Administered 2023-01-17 – 2023-01-19 (×3): 10 mg via ORAL
  Filled 2023-01-17 (×4): qty 2

## 2023-01-17 MED ORDER — POTASSIUM CHLORIDE 20 MEQ PO PACK: 80.0000 meq | PACK | Freq: Once | ORAL | Status: DC

## 2023-01-17 MED ORDER — ONDANSETRON HCL 4 MG/2ML IJ SOLN: 4.0000 mg | Freq: Four times a day (QID) | INTRAMUSCULAR | Status: DC | PRN

## 2023-01-17 MED ORDER — SODIUM CHLORIDE 0.9% FLUSH
3.0000 mL | Freq: Two times a day (BID) | INTRAVENOUS | Status: DC
  Administered 2023-01-17 – 2023-01-20 (×6): 3 mL via INTRAVENOUS

## 2023-01-17 MED ORDER — POTASSIUM CHLORIDE 10 MEQ/100ML IV SOLN
10.0000 meq | INTRAVENOUS | Status: AC
Start: 2023-01-17 — End: 2023-01-17
  Administered 2023-01-17 (×6): 10 meq via INTRAVENOUS
  Filled 2023-01-17 (×5): qty 100

## 2023-01-17 NOTE — ED Notes (Addendum)
Pt transferred to purple zone by ED Tech. Pt oriented to room, provided pillow and warm blanket. Pt watching tv. Juice provided per request. No needs expressed at this time. Updated on plan of care. Pt wearing burgundy scrubs on arrival with belongings in bag. Belongings secured by ED Tech Sam in locker # 4.

## 2023-01-17 NOTE — Plan of Care (Signed)
Patient ID: TARANCE PENRY, male   DOB: May 23, 1965, 57 y.o.   MRN: [id number]    Problem: Education: Goal: Knowledge of General Education information will improve Description: Including pain rating scale, medication(s)/side effects and non-pharmacologic comfort measures Outcome: Progressing   Problem: Health Behavior/Discharge Planning: Goal: Ability to manage health-related needs will improve Outcome: Progressing   Problem: Clinical Measurements: Goal: Ability to maintain clinical measurements within normal limits will improve Outcome: Progressing Goal: Will remain free from infection Outcome: Progressing Goal: Diagnostic test results will improve Outcome: Progressing Goal: Respiratory complications will improve Outcome: Progressing Goal: Cardiovascular complication will be avoided Outcome: Progressing   Problem: Activity: Goal: Risk for activity intolerance will decrease Outcome: Progressing   Problem: Nutrition: Goal: Adequate nutrition will be maintained Outcome: Progressing   Problem: Coping: Goal: Level of anxiety will decrease Outcome: Progressing   Problem: Elimination: Goal: Will not experience complications related to bowel motility Outcome: Progressing Goal: Will not experience complications related to urinary retention Outcome: Progressing   Problem: Pain Management: Goal: General experience of comfort will improve Outcome: Progressing   Problem: Safety: Goal: Ability to remain free from injury will improve Outcome: Progressing   Problem: Skin Integrity: Goal: Risk for impaired skin integrity will decrease Outcome: Progressing    Lidia Collum, RN

## 2023-01-17 NOTE — ED Notes (Signed)
ED TO INPATIENT HANDOFF REPORT  ED Nurse Name and Phone #: Beatris Ship RN [phone]  S Name/Age/Gender Francisco Bates 57 y.o. male Room/Bed: 003C/003C  Code Status   Code Status: Full Code  Home/SNF/Other Home Patient oriented to: self, place, time, and situation Is this baseline? Yes   Triage Complete: Triage complete  Chief Complaint Hypokalemia [E87.6]  Triage Note Pt arrives voluntarily from home with [employer]. Pt states he has been seeing people that are not there x 1 week. States they are threatening and he is scared of them. Pt denies SI/HI. Admits to drinking 2 x 24oz beers today that is not unusual for him, denies illicit drugs.   Allergies No Known Allergies  Level of Care/Admitting Diagnosis ED Disposition     ED Disposition  Admit   Condition  --   Comment  Hospital Area: [facility] [[id number]]  Level of Care: Telemetry Medical [104]  May place patient in observation at [facility] or [facility] if equivalent level of care is available:: No  Covid Evaluation: Asymptomatic - no recent exposure (last 10 days) testing not required  Diagnosis: Hypokalemia [172180]  Admitting Physician: Tereasa Coop [[id number]]  Attending Physician: Tereasa Coop [[id number]]          B Medical/Surgery History Past Medical History:  Diagnosis Date   Asthma    as child - no problems since   Patellar tendon rupture    RT knee   Rash    back   Recovering alcoholic (HCC)    Past Surgical History:  Procedure Laterality Date   ACHILLES TENDON SURGERY     KNEE SURGERY     1995   PATELLAR TENDON REPAIR Right 11/15/2012   Procedure: OPEN REPAIR RIGHT PATELLA TENDON ;  Surgeon: [name], MD;  Location: [facility];  Service: Orthopedics;  Laterality: Right;     A IV Location/Drains/Wounds Patient Lines/Drains/Airways Status     Active Line/Drains/Airways     Name Placement date Placement time Site Days   Peripheral IV 01/17/23 20 G Left Forearm 01/17/23   0216  Forearm  less than 1            Intake/Output Last 24 hours  Intake/Output Summary (Last 24 hours) at 01/17/2023 1019 Last data filed at 01/17/2023 5366 Gross per 24 hour  Intake 300 ml  Output --  Net 300 ml    Labs/Imaging Results for orders placed or performed during the hospital encounter of 01/16/23 (from the past 48 hour(s))  Comprehensive metabolic panel     Status: Abnormal   Collection Time: 01/16/23 12:21 PM  Result Value Ref Range   Sodium 138 135 - 145 mmol/L   Potassium 2.6 (LL) 3.5 - 5.1 mmol/L    Comment: CRITICAL RESULT CALLED TO, READ BACK BY AND VERIFIED WITH C,CARICO RN @1424  01/16/23 E,BENTON   Chloride 93 (L) 98 - 111 mmol/L   CO2 29 22 - 32 mmol/L   Glucose, Bld 80 70 - 99 mg/dL    Comment: Glucose reference range applies only to samples taken after fasting for at least 8 hours.   BUN 9 6 - 20 mg/dL   Creatinine, Ser 4.40 0.61 - 1.24 mg/dL   Calcium 8.9 8.9 - 34.7 mg/dL   Total Protein 7.4 6.5 - 8.1 g/dL   Albumin 3.3 (L) 3.5 - 5.0 g/dL   AST 67 (H) 15 - 41 U/L   ALT 32 0 - 44 U/L   Alkaline Phosphatase 53  38 - 126 U/L   Total Bilirubin 1.4 (H) 0.3 - 1.2 mg/dL   GFR, Estimated >95 >63 mL/min    Comment: (NOTE) Calculated using the CKD-EPI Creatinine Equation (2021)    Anion gap 16 (H) 5 - 15    Comment: Performed at [facility], [address] [place], [place] [address]  Ethanol     Status: None   Collection Time: 01/16/23 12:21 PM  Result Value Ref Range   Alcohol, Ethyl (B) <10 <10 mg/dL    Comment: (NOTE) Lowest detectable limit for serum alcohol is 10 mg/dL.  For medical purposes only. Performed at [facility], [address], [place], [place] [address]   CBC with Diff     Status: None   Collection Time: 01/16/23 12:21 PM  Result Value Ref Range   WBC 5.9 4.0 - 10.5 K/uL   RBC 4.28 4.22 - 5.81 MIL/uL   Hemoglobin 13.8 13.0 - 17.0 g/dL   HCT 18.8 41.6 - 60.6 %   MCV 92.5 80.0 - 100.0 fL   MCH 32.2 26.0 -  34.0 pg   MCHC 34.8 30.0 - 36.0 g/dL   RDW 30.1 60.1 - 09.3 %   Platelets 324 150 - 400 K/uL   nRBC 0.0 0.0 - 0.2 %   Neutrophils Relative % 55 %   Neutro Abs 3.3 1.7 - 7.7 K/uL   Lymphocytes Relative 26 %   Lymphs Abs 1.6 0.7 - 4.0 K/uL   Monocytes Relative 14 %   Monocytes Absolute 0.8 0.1 - 1.0 K/uL   Eosinophils Relative 3 %   Eosinophils Absolute 0.2 0.0 - 0.5 K/uL   Basophils Relative 1 %   Basophils Absolute 0.0 0.0 - 0.1 K/uL   Immature Granulocytes 1 %   Abs Immature Granulocytes 0.03 0.00 - 0.07 K/uL    Comment: Performed at [facility], [address], [place], [place] [address]  Urine rapid drug screen (hosp performed)     Status: Abnormal   Collection Time: 01/16/23  2:05 PM  Result Value Ref Range   Opiates NONE DETECTED NONE DETECTED   Cocaine POSITIVE (A) NONE DETECTED   Benzodiazepines NONE DETECTED NONE DETECTED   Amphetamines NONE DETECTED NONE DETECTED   Tetrahydrocannabinol NONE DETECTED NONE DETECTED   Barbiturates NONE DETECTED NONE DETECTED    Comment: (NOTE) DRUG SCREEN FOR MEDICAL PURPOSES ONLY.  IF CONFIRMATION IS NEEDED FOR ANY PURPOSE, NOTIFY LAB WITHIN 5 DAYS.  LOWEST DETECTABLE LIMITS FOR URINE DRUG SCREEN Drug Class                     Cutoff (ng/mL) Amphetamine and metabolites    1000 Barbiturate and metabolites    200 Benzodiazepine                 200 Opiates and metabolites        300 Cocaine and metabolites        300 THC                            50 Performed at [facility], [address] [place], [place] [address]   Potassium     Status: Abnormal   Collection Time: 01/17/23  1:19 AM  Result Value Ref Range   Potassium 2.6 (LL) 3.5 - 5.1 mmol/L    Comment: CRITICAL RESULT CALLED TO, READ BACK BY AND VERIFIED WITH H. Chase Picket  RN [phone] [address] [facility] Performed at [facility], [address], [place], [place] [address]   Magnesium     Status: None   Collection Time: 01/17/23  2:45 AM  Result Value Ref  Range   Magnesium 1.9 1.7 - 2.4 mg/dL    Comment: Performed at [facility], [address] [place], [place] [address]  Phosphorus     Status: Abnormal   Collection Time: 01/17/23  2:45 AM  Result Value Ref Range   Phosphorus 2.1 (L) 2.5 - 4.6 mg/dL    Comment: Performed at [facility], [address], [place], [place] [address]  Basic metabolic panel     Status: Abnormal   Collection Time: 01/17/23  7:33 AM  Result Value Ref Range   Sodium 137 135 - 145 mmol/L   Potassium 3.2 (L) 3.5 - 5.1 mmol/L   Chloride 100 98 - 111 mmol/L   CO2 28 22 - 32 mmol/L   Glucose, Bld 90 70 - 99 mg/dL    Comment: Glucose reference range applies only to samples taken after fasting for at least 8 hours.   BUN 8 6 - 20 mg/dL   Creatinine, Ser 9.56 0.61 - 1.24 mg/dL   Calcium 8.5 (L) 8.9 - 10.3 mg/dL   GFR, Estimated >21 >30 mL/min    Comment: (NOTE) Calculated using the CKD-EPI Creatinine Equation (2021)    Anion gap 9 5 - 15    Comment: Performed at [facility], [address], [place], [place] [address]   No results found.  Pending Labs Unresulted Labs (From admission, onward)     Start     Ordered   01/18/23 0500  CBC  Daily,   R      01/17/23 0250   01/18/23 0500  Comprehensive metabolic panel  Daily,   R      01/17/23 0250   01/17/23 0236  HIV Antibody (routine testing w rflx)  (HIV Antibody (Routine testing w reflex) panel)  Once,   R        01/17/23 0235            Vitals/Pain Today's Vitals   01/17/23 0803 01/17/23 0900 01/17/23 0920 01/17/23 1000  BP: (!) 152/84 (!) 163/91  (!) 163/81  Pulse: 78 83  61  Resp: 18 20  18   Temp:   98.5 F (36.9 C)   TempSrc:   Oral   SpO2: 98% 99%  95%  Weight:      Height:      PainSc:        Isolation Precautions No active isolations  Medications Medications  OLANZapine zydis (ZYPREXA) disintegrating tablet 10 mg (10 mg Oral Given 01/17/23 0035)  amLODipine (NORVASC) tablet 10 mg (10 mg Oral Given 01/17/23  1012)  pantoprazole (PROTONIX) EC tablet 40 mg (40 mg Oral Given 01/17/23 1011)  enoxaparin (LOVENOX) injection 40 mg (40 mg Subcutaneous Given 01/17/23 1014)  sodium chloride flush (NS) 0.9 % injection 3 mL (0 mLs Intravenous Hold 01/17/23 1002)  sodium chloride flush (NS) 0.9 % injection 3 mL (has no administration in time range)  0.9 %  sodium chloride infusion (has no administration in time range)  acetaminophen (TYLENOL) tablet 650 mg (has no administration in time range)    Or  acetaminophen (TYLENOL) suppository 650 mg (has no administration in time range)  senna-docusate (Senokot-S) tablet 1 tablet (has no administration in time range)  hydrALAZINE (APRESOLINE) injection 10 mg (  has no administration in time range)  LORazepam (ATIVAN) tablet 1-4 mg (has no administration in time range)    Or  LORazepam (ATIVAN) injection 1-4 mg (has no administration in time range)  thiamine (VITAMIN B1) tablet 100 mg (100 mg Oral Given 01/17/23 1012)    Or  thiamine (VITAMIN B1) injection 100 mg ( Intravenous See Alternative 01/17/23 1012)  folic acid (FOLVITE) tablet 1 mg (1 mg Oral Given 01/17/23 1011)  multivitamin with minerals tablet 1 tablet (1 tablet Oral Given 01/17/23 1011)  sodium phosphate 30 mmol in dextrose 5 % 250 mL infusion (30 mmol Intravenous New Bag/Given 01/17/23 1011)  potassium chloride SA (KLOR-CON M) CR tablet 40 mEq (40 mEq Oral Given 01/16/23 1520)  potassium chloride 10 mEq in 100 mL IVPB (0 mEq Intravenous Stopped 01/16/23 1741)  potassium chloride 10 mEq in 100 mL IVPB (0 mEq Intravenous Stopped 01/17/23 0958)    Mobility walks      R Recommendations: See Admitting Provider Note  Report given to: 2W28

## 2023-01-17 NOTE — ED Provider Notes (Cosign Needed Addendum)
  Physical Exam  BP (!) 148/89   Pulse 86   Temp 98.6 F (37 C) (Oral)   Resp 15   Ht 5\' 11"  (1.803 m)   Wt 108 kg   SpO2 98%   BMI 33.19 kg/m   Physical Exam  Procedures  Procedures  ED Course / MDM   Clinical Course as of 01/17/23 0234  Fri Jan 16, 2023  3244 Visual hallucinations, EtoH involved. Refusing labs. Asleep now. Here voluntarily. Potential TTS eval if willing to have labs drawn. If not, can potentially dc with outpatient resources.  [JR]  Sat Jan 17, 2023  0211 Psychiatry recommending inpatient management, however given profound hypokalemia prior to psychiatric evaluation requesting repeat potassium level.  Repeat level 2.6, will proceed with medical admission for severe hypokalemia for further medical stabilization prior to psychiatric admission. [RS]  0230 Consult to Dr. Janalyn Shy, hospitalist, who is agreeable to admitting this patient to her service. I appreciate her collaboration in the care of this patient.  [RS]    Clinical Course User Index [JR] Gareth Eagle, PA-C [RS] Lorik Guo, Eugene Gavia, PA-C   Medical Decision Making Amount and/or Complexity of Data Reviewed Labs: ordered.  Risk OTC drugs. Prescription drug management. Decision regarding hospitalization.     Care assumed from preceding ED provider Eual Fines, PA-C at time of shift change.  Please see notes from preceding ED providers for further insight of this patient's ED course.  In brief patient is a 57 year old male presenting for hallucinations.  Was found to be hypokalemic which was repleted in the ED.  TTS consult pending at time of shift change.  Per psychiatric MD Dr. Velna Hatchet, patient meeting inpatient criteria, recommending admission and has ordered olanzapine for this patient.  He is medically cleared and pending psychiatric placement  ADDENDUM: Will require medical admission for hypokalemia at this time. Pending hospital medicine consultation. Admitted to medicine for  stabilization.  This chart was dictated using voice recognition software, Dragon. Despite the best efforts of this provider to proofread and correct errors, errors may still occur which can change documentation meaning.      Paris Lore, PA-C 01/17/23 0020    Mahrukh Seguin, Eugene Gavia, PA-C 01/17/23 0235    Maia Plan, MD 01/20/23 1110

## 2023-01-17 NOTE — H&P (Addendum)
History and Physical    Francisco Bates YNW:[id number] DOB: 31-Jul-1965 DOA: 01/16/2023  PCP: Rema Fendt, NP   Patient coming from: Home   Chief Complaint:  Chief Complaint  Patient presents with   Hallucinations   ED TRIAGE note:  Pt arrives voluntarily from home with [employer]. Pt states he has been seeing people that are not there x 1 week. States they are threatening and he is scared of them. Pt denies SI/HI. Admits to drinking 2 x 24oz beers today that is not unusual for him, denies illicit drugs.    HPI:  Francisco Bates is a 57 y.o. male with medical history significant of essential hypertension and chronic alcohol use presented to emergency department for evaluation for hallucination.  Patient reported he has visual hallucination for last 1 to 2 weeks as well as paranoid thought that people are coming after him.  Patient never had similar episodes before.  Denies any family history of psychiatric disturbance.  Patient denies any suicidal ideation or thoughts.  Denies any homicidal ideation and thought.  No prior psychiatric hospitalization.  During my evaluation at bedside, patient is very sleepy as he received his Zyprexa 2 hours ago.  Unable to get any good story from the patient.  However he denies any vomiting and diarrhea.  Patient is unclear/unsure about drinking alcohol and about his last drink.  ED Course:  At ED initial presentation patient is hemodynamically stable. Initial CMP showing low potassium 2.6, sodium 138, low chloride 92, transaminitis around baseline. Blood alcohol level less than 10. CBC grossly unremarkable. UDS positive with cocaine.  In the ED patient received oral KCl 40 mEq and IV 20 mEq daily repeat potassium is 2.6.  Psychiatry has been evaluated patient in the ED.  Per psychiatry diagnosis of psychosis and recommended to start olanzapine 10 mg nightly. Also psych it recommended to admit patient under hospitalist care for the management of  hypokalemia.  They will continue to follow the patient. Hospitalist has been contacted for management of hypokalemia.  Review of Systems:  Review of Systems  Constitutional:  Negative for chills and fever.  Respiratory:  Negative for cough and shortness of breath.   Cardiovascular:  Negative for chest pain.  Gastrointestinal:  Negative for diarrhea and vomiting.  Musculoskeletal:  Negative for myalgias.  Neurological:  Negative for dizziness and headaches.  Psychiatric/Behavioral:  Positive for hallucinations. Negative for substance abuse and suicidal ideas. The patient is not nervous/anxious.     Past Medical History:  Diagnosis Date   Asthma    as child - no problems since   Patellar tendon rupture    RT knee   Rash    back   Recovering alcoholic (HCC)     Past Surgical History:  Procedure Laterality Date   ACHILLES TENDON SURGERY     KNEE SURGERY     1995   PATELLAR TENDON REPAIR Right 11/15/2012   Procedure: OPEN REPAIR RIGHT PATELLA TENDON ;  Surgeon: [name], MD;  Location: [facility];  Service: Orthopedics;  Laterality: Right;     reports that he has never smoked. He has never used smokeless tobacco. He reports that he does not drink alcohol and does not use drugs.  No Known Allergies  Family History  Problem Relation Age of Onset   Hypertension Other    Cancer Mother    Cancer Father     Prior to Admission medications   Medication Sig Start Date End Date Taking? Authorizing  Provider  amLODipine (NORVASC) 10 MG tablet Take 1 tablet (10 mg total) by mouth daily. 09/24/22   Kathlen Mody, MD  pantoprazole (PROTONIX) 40 MG tablet Take 1 tablet (40 mg total) by mouth daily. Take 30 minutes before breakfast. 12/22/22   Arnaldo Natal, NP     Physical Exam: Vitals:   01/16/23 0938 01/16/23 1312 01/16/23 1720 01/16/23 2123  BP: (!) 148/70 (!) 145/76 126/73 (!) 148/89  Pulse: 93 61 81 86  Resp: 19 18 19 15   Temp: 98.4 F (36.9 C) 98.4 F (36.9 C) 99  F (37.2 C) 98.6 F (37 C)  TempSrc:    Oral  SpO2: 97% 97% 98% 98%  Weight:      Height:        Physical Exam Constitutional:      General: He is not in acute distress.    Appearance: He is not ill-appearing.  HENT:     Head: Normocephalic and atraumatic.     Mouth/Throat:     Mouth: Mucous membranes are moist.  Eyes:     Pupils: Pupils are equal, round, and reactive to light.  Cardiovascular:     Rate and Rhythm: Normal rate and regular rhythm.     Pulses: Normal pulses.     Heart sounds: Normal heart sounds.  Pulmonary:     Effort: Pulmonary effort is normal.     Breath sounds: Normal breath sounds.  Abdominal:     General: Bowel sounds are normal.  Musculoskeletal:        General: No swelling.     Cervical back: Neck supple.     Right lower leg: No edema.     Left lower leg: No edema.  Skin:    Capillary Refill: Capillary refill takes less than 2 seconds.     Coloration: Skin is not jaundiced or pale.     Findings: No bruising or lesion.  Neurological:     Comments: Patient is very sleepy due to the effect of Zyprexa.  Psychiatric:     Comments: Unable to assess.      Labs on Admission: I have personally reviewed following labs and imaging studies  CBC: Recent Labs  Lab 01/16/23 1221  WBC 5.9  NEUTROABS 3.3  HGB 13.8  HCT 39.6  MCV 92.5  PLT 324   Basic Metabolic Panel: Recent Labs  Lab 01/16/23 1221 01/17/23 0119  NA 138  --   K 2.6* 2.6*  CL 93*  --   CO2 29  --   GLUCOSE 80  --   BUN 9  --   CREATININE 0.82  --   CALCIUM 8.9  --    GFR: Estimated Creatinine Clearance: 127.3 mL/min (by C-G formula based on SCr of 0.82 mg/dL). Liver Function Tests: Recent Labs  Lab 01/16/23 1221  AST 67*  ALT 32  ALKPHOS 53  BILITOT 1.4*  PROT 7.4  ALBUMIN 3.3*   No results for input(s): "LIPASE", "AMYLASE" in the last 168 hours. No results for input(s): "AMMONIA" in the last 168 hours. Coagulation Profile: No results for input(s): "INR",  "PROTIME" in the last 168 hours. Cardiac Enzymes: No results for input(s): "CKTOTAL", "CKMB", "CKMBINDEX", "TROPONINI", "TROPONINIHS" in the last 168 hours. BNP (last 3 results) No results for input(s): "BNP" in the last 8760 hours. HbA1C: No results for input(s): "HGBA1C" in the last 72 hours. CBG: No results for input(s): "GLUCAP" in the last 168 hours. Lipid Profile: No results for input(s): "CHOL", "HDL", "LDLCALC", "TRIG", "  CHOLHDL", "LDLDIRECT" in the last 72 hours. Thyroid Function Tests: No results for input(s): "TSH", "T4TOTAL", "FREET4", "T3FREE", "THYROIDAB" in the last 72 hours. Anemia Panel: No results for input(s): "VITAMINB12", "FOLATE", "FERRITIN", "TIBC", "IRON", "RETICCTPCT" in the last 72 hours. Urine analysis:    Component Value Date/Time   COLORURINE YELLOW 09/19/2022 1646   APPEARANCEUR CLEAR 09/19/2022 1646   LABSPEC 1.010 09/19/2022 1646   PHURINE 8.0 09/19/2022 1646   GLUCOSEU NEGATIVE 09/19/2022 1646   HGBUR NEGATIVE 09/19/2022 1646   BILIRUBINUR NEGATIVE 09/19/2022 1646   BILIRUBINUR small (A) 09/05/2021 1715   KETONESUR NEGATIVE 09/19/2022 1646   PROTEINUR NEGATIVE 09/19/2022 1646   UROBILINOGEN 1.0 09/05/2021 1715   UROBILINOGEN 1.0 10/19/2014 1339   NITRITE NEGATIVE 09/19/2022 1646   LEUKOCYTESUR NEGATIVE 09/19/2022 1646    Radiological Exams on Admission: I have personally reviewed images No results found.  EKG: My personal interpretation of EKG shows: EKG showing normal sinus rhythm heart rate 94.  Prolonged QTc.  There is no ST-T wave abnormality.    Assessment/Plan: Principal Problem:   Hypokalemia Active Problems:   Psychosis (HCC)   Essential hypertension   Chronic alcohol use    Assessment and Plan: Hypokalemia -Incidental finding of  the lab work showing low potassium 2.6.  Patient denies any vomiting and diarrhea.  Per chart review patient has evidence of hypokalemia 2 months ago and that time he came to ED for evaluation for  alcohol use disorder. -Hypokalemia secondary from in the setting of chronic alcohol use. - Repeat potassium 2.6 even after 60 mEq potassium replacement in the ED. - Patient is very sleepy and unsure if able to take oral potassium now. - Giving IV potassium 10 mEq x 6 doses. -Checking mag and Phos level. - Continue to monitor electrolytes and replete as needed  Psychosis with hallucination -Presenting with complaining of hallucination for last 2 weeks.  Psychiatry has been evaluated patient in the ED and initiated Zyprexa 10 g at bedtime. - Psychiatry will continue to follow the patient.  Essential hypertension -Continue amlodipine 10 mg daily. - Continue hydralazine as needed  History of chronic alcohol use -Unable to get any history from the patient about recent alcohol use or not.  Per ED triage note patient had 24oz x 2 beer 10/15. -Continue CIWA protocol and Ativan as needed - Continue folic acid, thiamine and multivitamin.  QTc prolongation - EKG showing QT prolongation 444.  Due to progression  in the setting of hypokalemia - Continue cardiac monitoring. - Continue to replete electrolytes.  DVT prophylaxis:  Lovenox Code Status:  Full Code by default.  Unable to discuss CODE STATUS with patient at the bedside as he is very sleepy. Diet: Heart healthy diet Family Communication:   No family member at bedside now Disposition Plan: Tentative discharge to home next 1 to 2 days. Consults: Psychiatry Admission status:   Observation, Telemetry bed  Severity of Illness: The appropriate patient status for this patient is OBSERVATION. Observation status is judged to be reasonable and necessary in order to provide the required intensity of service to ensure the patient's safety. The patient's presenting symptoms, physical exam findings, and initial radiographic and laboratory data in the context of their medical condition is felt to place them at decreased risk for further clinical  deterioration. Furthermore, it is anticipated that the patient will be medically stable for discharge from the hospital within 2 midnights of admission.     Tereasa Coop, MD [facility]  How to contact the  [facility] Attending or Consulting provider 7A - 7P or covering provider during after hours 7P -7A, for this patient.  Check the care team in [facility] and look for a) attending/consulting [facility] provider listed and b) the [facility] team listed Log into [email or web address] and use [place]'s universal password to access. If you do not have the password, please contact the hospital operator. Locate the [facility] provider you are looking for under [facility] and page to a number that you can be directly reached. If you still have difficulty reaching the provider, please page the [facility] (Director on Call) for the Hospitalists listed on amion for assistance.  01/17/2023, 2:57 AM

## 2023-01-17 NOTE — Progress Notes (Signed)
Progress Note   Patient: Francisco Bates DGU:[id number] DOB: [birth year] DOA: 01/16/2023     0 DOS: the patient was seen and examined on 01/17/2023   Brief hospital course: Francisco Bates is a 57 y.o. male with medical history significant of essential hypertension and chronic alcohol use presented to emergency department for evaluation for hallucination.  Patient reported he has visual hallucination for last 1 to 2 weeks as well as paranoid thought that people are coming after him.  Patient never had similar episodes before.  Denies any family history of psychiatric disturbance.  Patient denies any suicidal ideation or thoughts.  Denies any homicidal ideation and thought.  No prior psychiatric hospitalization.  Upon initial evaluation at bedside in the ED, the patient is very sleepy as he received his Zyprexa 2 hours ago.  Unable to get any good story from the patient.  However he denies any vomiting and diarrhea.  Patient is unclear/unsure about drinking alcohol and about his last drink.  Upon my visit on 01/17/2023 the patient is somnolent and stirs slightly and mumbles to my vocal attempts to rouse him.  Later in the afternoon I was able to speak with the patient on the phone. At this time he endorsed that he wanted inpatient psychiatric treatment for his psychiatric issues. I explained that he would remain inpatient overnight due to irregularities in his electrolytes and to evaluate his response to antipsychotics.     Assessment and Plan: Hypokalemia The patient's potassium has improved from 2.6 on admission to 3.2 this afternoon. He is requiring ongoing supplementation. Will continue to closely monitor and supplement as necessary.  Hypophosphatemia Phosphorous was low at 2.1 on admission and is being supplementation. Will continue to monitor and supplement as necessary.  Psychosis (HCC) The patient presented to the ED with complaints of hallucinations for the past 2 weeks. He has been  evaluated by psychiatry. They have started the patient on zyprexa. They have stated that they support the patient's treatment in an inpatient psychiatric facility primarily because of the patient's fear of going home and getting worse due to hallucinations and his mental illness. They state that if the patient were to state that he felt comfortable going home with outpatient psychiatric follow up, they would support this as well.  Chronic alcohol use The patient's blood alcohol level upon presentation was < 10. This raises the question of the role of alcohol withdrawal in his symptoms. However, the patient states that the hallucinations have been going on for 2 weeks which takes this presentation outside the timeframe of alcohol withdrawal.  Essential hypertension The patient will be continued on his home medications of amlodipine to control his blood pressure while inpatient.        Subjective: The patient is sleeping soundly upon my entering his room. He responded to voice by mumbling and stirring in bed. He then quickly went back to sleep.  Physical Exam: Vitals:   01/17/23 0920 01/17/23 1000 01/17/23 1023 01/17/23 1628  BP:  (!) 163/81  126/72  Pulse:  61  85  Resp:  18  18  Temp: 98.5 F (36.9 C)  98.5 F (36.9 C) 98 F (36.7 C)  TempSrc: Oral  Oral   SpO2:  95%  99%  Weight:      Height:       Exam:  Constitutional:  The patient is sleeping soundly. He is difficult to rouse largely due to antipsychotics given. He does stir to voice and mumble in response  to questions. Respiratory:  No increased work of breathing. No wheezes, rales, or rhonchi No tactile fremitus Cardiovascular:  Regular rate and rhythm No murmurs, ectopy, or gallups. No lateral PMI. No thrills. Abdomen:  Abdomen is soft, non-tender, non-distended No hernias, masses, or organomegaly Normoactive bowel sounds.  Musculoskeletal:  No cyanosis, clubbing, or edema Skin:  No rashes, lesions,  ulcers palpation of skin: no induration or nodules Neurologic:  Unable to evaluate as patient is unable to cooperate with exam. Psychiatric:  Unable to evaluate as patient is unable to cooperate with exam.  Data Reviewed:    CBC BMP  Family Communication: None available. I was able to speak, with the patient's consent, to the patient's girlfriend. All questions answered to the best of my ability.  Disposition: Status is: Observation The patient remains OBS appropriate and will d/c before 2 midnights.  Planned Discharge Destination:  Inpatient psychiatry    Time spent: 38 minutes  Author: Nhia Heaphy, DO 01/17/2023 6:51 PM  For on call review [email or web address].

## 2023-01-17 NOTE — Progress Notes (Signed)
[facility] Consult Note  Patient Name: Francisco Bates MRN: [id number] DOB: 02-22-1966 DATE OF Consult: 01/17/2023  PRIMARY PSYCHIATRIC DIAGNOSES  1.  psychosis 2.  Cocaine use    RECOMMENDATIONS  Recommendations: Medication recommendations: Start Olanzapine 10 mg QHS Non-Medication/therapeutic recommendations: Abstain from cocaine use  Is inpatient psychiatric hospitalization recommended for this patient? Yes (Explain why): patient is afraid of leaving the hospital in his current state and wants to make sure medication is helping prior to retuning home  From a psychiatric perspective, is this patient appropriate for discharge to an outpatient setting/resource or other less restrictive environment for continued care?  Yes (Explain why): if he feels better in the am and safe to discharge with outpatient services  Follow-Up Telepsychiatry C/L services: We will sign off for now. Please re-consult our service if needed for any concerning changes in the patient's condition, discharge planning, or questions. Communication: Treatment team members (and family members if applicable) who were involved in treatment/care discussions and planning, and with whom we spoke or engaged with via secure text/chat, include the following: Secure chat   Thank you for involving [place] in the care of this patient. If you have any additional questions or concerns, please call [phone] and ask for me or the provider on-call.  TELEPSYCHIATRY ATTESTATION & CONSENT  As the provider for this telehealth consult, I attest that I verified the patient's identity using two separate identifiers, introduced myself to the patient, provided my credentials, disclosed my location, and performed this encounter via a HIPAA-compliant, real-time, face-to-face, two-way, interactive audio and video platform and with the full consent and agreement of the patient (or guardian as applicable.)  Patient physical location: [facility]  Emergency Department at [facility] . Telehealth provider physical location: home office in state of [place].  Video start time: 10:45 pm  (Central Time) Video end time: 11:15  (Central Time)  IDENTIFYING DATA  Francisco Bates is a 57 y.o. year-old male for whom a psychiatric consultation has been ordered by the primary provider. The patient was identified using two separate identifiers.  CHIEF COMPLAINT/REASON FOR CONSULT  Paranoid thoughts/ hallucinations   HISTORY OF PRESENT ILLNESS (HPI)  The patient Is a 57 year old male with a history of cocaine use who presents to the emergency department today complaining of visual hallucinations that started 1 to 2 weeks ago as well as paranoid thoughts that people are after him.  He has never had this before, denies family history.  Today he presented intoxicated by alcohol which is unusual for him. During my assessment, he seems frightened.  Distraught that nobody else sees it.  Has never experienced this before. We discussed the possibility of starting an antipsychotic and following up as an outpatient but he is now comfortable leaving the hospital feeling this way, with hallucinations and feeling that people are out to get him.  He is agreeable to starting olanzapine tonight and inpatient admission to make sure that the medication works in the hallucinations subside. He denies suicidal or homicidal ideation.  No prior psychiatric hospitalizations.Marland Kitchen  PAST PSYCHIATRIC HISTORY  Cocaine use  Otherwise as per HPI above.  PAST MEDICAL HISTORY  Past Medical History:  Diagnosis Date   Asthma    as child - no problems since   Patellar tendon rupture    RT knee   Rash    back   Recovering alcoholic [facility])      HOME MEDICATIONS  Facility Ordered Medications  Medication   [COMPLETED] potassium chloride  SA (KLOR-CON M) CR tablet 40 mEq   [COMPLETED] potassium chloride 10 mEq in 100 mL IVPB   OLANZapine zydis (ZYPREXA) disintegrating tablet 10 mg    PTA Medications  Medication Sig   amLODipine (NORVASC) 10 MG tablet Take 1 tablet (10 mg total) by mouth daily.   pantoprazole (PROTONIX) 40 MG tablet Take 1 tablet (40 mg total) by mouth daily. Take 30 minutes before breakfast.     ALLERGIES  No Known Allergies  SOCIAL & SUBSTANCE USE HISTORY  Social History   Socioeconomic History   Marital status: Divorced    Spouse name: Not on file   Number of children: Not on file   Years of education: Not on file   Highest education level: 12th grade  Occupational History   Not on file  Tobacco Use   Smoking status: Never   Smokeless tobacco: Never  Vaping Use   Vaping status: Never Used  Substance and Sexual Activity   Alcohol use: No    Comment: quit in 08/1974   Drug use: No   Sexual activity: Not Currently  Other Topics Concern   Not on file  Social History Narrative   Not on file   Social Determinants of Health   Financial Resource Strain: Low Risk  (01/09/2023)   Overall Financial Resource Strain (CARDIA)    Difficulty of Paying Living Expenses: Not hard at all  Food Insecurity: No Food Insecurity (01/09/2023)   Hunger Vital Sign    Worried About Running Out of Food in the Last Year: Never true    Ran Out of Food in the Last Year: Never true  Transportation Needs: No Transportation Needs (01/09/2023)   PRAPARE - Administrator, Civil Service (Medical): No    Lack of Transportation (Non-Medical): No  Physical Activity: Insufficiently Active (01/09/2023)   Exercise Vital Sign    Days of Exercise per Week: 4 days    Minutes of Exercise per Session: 30 min  Stress: No Stress Concern Present (01/09/2023)   [employer] of Occupational Health - Occupational Stress Questionnaire    Feeling of Stress : Not at all  Social Connections: Unknown (01/09/2023)   Social Connection and Isolation Panel [NHANES]    Frequency of Communication with Friends and Family: Twice a week    Frequency of Social Gatherings  with Friends and Family: Patient declined    Attends Religious Services: Patient declined    Database administrator or Organizations: No    Attends Engineer, structural: Not on file    Marital Status: Divorced   Social History   Tobacco Use  Smoking Status Never  Smokeless Tobacco Never   Social History   Substance and Sexual Activity  Alcohol Use No   Comment: quit in 08/1974   Social History   Substance and Sexual Activity  Drug Use No     FAMILY HISTORY  Family History  Problem Relation Age of Onset   Hypertension Other    Cancer Mother    Cancer Father      MENTAL STATUS EXAM (MSE)  Presentation  General Appearance:  Appropriate for Environment  Eye Contact: Poor  Speech: Clear and Coherent  Speech Volume: Normal  Handedness:No data recorded  Mood and Affect  Mood: Anxious; Dysphoric  Affect: Congruent   Thought Process  Thought Processes: Coherent  Descriptions of Associations: Intact  Orientation: Full (Time, Place and Person)  Thought Content: Delusions  History of Schizophrenia/Schizoaffective disorder: No  Duration of  Psychotic Symptoms: Less than six months  Hallucinations: Hallucinations: Auditory; Visual Description of Auditory Hallucinations: seing 4-5 heads with a black hat on. Feeling peopl eare after him Description of Visual Hallucinations: seing 4-5 heads with a black hat on. Feeling peopl eare after him  Ideas of Reference: Delusions; Paranoia  Suicidal Thoughts: Suicidal Thoughts: No  Homicidal Thoughts: Homicidal Thoughts: No   Sensorium  Memory: Immediate Good; Recent Good  Judgment: Fair  Insight: Fair   Chartered certified accountant: Good  Attention Span: Fair  Recall: Fair  Fund of Knowledge: Fair  Language: Fair   Psychomotor Activity  Psychomotor Activity: Psychomotor Activity: Normal   Assets  Assets: Physical Health; Desire for Improvement   Sleep   Sleep: Sleep: Poor   VITALS  Blood pressure (!) 148/89, pulse 86, temperature 98.6 F (37 C), temperature source Oral, resp. rate 15, height 5\' 11"  (1.803 m), weight 108 kg, SpO2 98%.  LABS  Admission on 01/16/2023  Component Date Value Ref Range Status   Sodium 01/16/2023 138  135 - 145 mmol/L Final   Potassium 01/16/2023 2.6 (LL)  3.5 - 5.1 mmol/L Final   CRITICAL RESULT CALLED TO, READ BACK BY AND VERIFIED WITH C,CARICO RN @1424  01/16/23 E,BENTON   Chloride 01/16/2023 93 (L)  98 - 111 mmol/L Final   CO2 01/16/2023 29  22 - 32 mmol/L Final   Glucose, Bld 01/16/2023 80  70 - 99 mg/dL Final   Glucose reference range applies only to samples taken after fasting for at least 8 hours.   BUN 01/16/2023 9  6 - 20 mg/dL Final   Creatinine, Ser 01/16/2023 0.82  0.61 - 1.24 mg/dL Final   Calcium 40/98/1191 8.9  8.9 - 10.3 mg/dL Final   Total Protein 47/82/9562 7.4  6.5 - 8.1 g/dL Final   Albumin 13/10/6576 3.3 (L)  3.5 - 5.0 g/dL Final   AST 46/96/2952 67 (H)  15 - 41 U/L Final   ALT 01/16/2023 32  0 - 44 U/L Final   Alkaline Phosphatase 01/16/2023 53  38 - 126 U/L Final   Total Bilirubin 01/16/2023 1.4 (H)  0.3 - 1.2 mg/dL Final   GFR, Estimated 01/16/2023 >60  >60 mL/min Final   Comment: (NOTE) Calculated using the CKD-EPI Creatinine Equation (2021)    Anion gap 01/16/2023 16 (H)  5 - 15 Final   Performed at [facility], [address] [place], [place] [address]   Alcohol, Ethyl (B) 01/16/2023 <10  <10 mg/dL Final   Comment: (NOTE) Lowest detectable limit for serum alcohol is 10 mg/dL.  For medical purposes only. Performed at [facility], [address] [place], [place] [address]    Opiates 01/16/2023 NONE DETECTED  NONE DETECTED Final   Cocaine 01/16/2023 POSITIVE (A)  NONE DETECTED Final   Benzodiazepines 01/16/2023 NONE DETECTED  NONE DETECTED Final   Amphetamines 01/16/2023 NONE DETECTED  NONE DETECTED Final   Tetrahydrocannabinol 01/16/2023 NONE DETECTED   NONE DETECTED Final   Barbiturates 01/16/2023 NONE DETECTED  NONE DETECTED Final   Comment: (NOTE) DRUG SCREEN FOR MEDICAL PURPOSES ONLY.  IF CONFIRMATION IS NEEDED FOR ANY PURPOSE, NOTIFY LAB WITHIN 5 DAYS.  LOWEST DETECTABLE LIMITS FOR URINE DRUG SCREEN Drug Class                     Cutoff (ng/mL) Amphetamine and metabolites    1000 Barbiturate and metabolites    200 Benzodiazepine  200 Opiates and metabolites        300 Cocaine and metabolites        300 THC                            50 Performed at [facility], [address] [place], [place] [address]    WBC 01/16/2023 5.9  4.0 - 10.5 K/uL Final   RBC 01/16/2023 4.28  4.22 - 5.81 MIL/uL Final   Hemoglobin 01/16/2023 13.8  13.0 - 17.0 g/dL Final   HCT 86/57/8469 39.6  39.0 - 52.0 % Final   MCV 01/16/2023 92.5  80.0 - 100.0 fL Final   MCH 01/16/2023 32.2  26.0 - 34.0 pg Final   MCHC 01/16/2023 34.8  30.0 - 36.0 g/dL Final   RDW 62/95/2841 13.2  11.5 - 15.5 % Final   Platelets 01/16/2023 324  150 - 400 K/uL Final   nRBC 01/16/2023 0.0  0.0 - 0.2 % Final   Neutrophils Relative % 01/16/2023 55  % Final   Neutro Abs 01/16/2023 3.3  1.7 - 7.7 K/uL Final   Lymphocytes Relative 01/16/2023 26  % Final   Lymphs Abs 01/16/2023 1.6  0.7 - 4.0 K/uL Final   Monocytes Relative 01/16/2023 14  % Final   Monocytes Absolute 01/16/2023 0.8  0.1 - 1.0 K/uL Final   Eosinophils Relative 01/16/2023 3  % Final   Eosinophils Absolute 01/16/2023 0.2  0.0 - 0.5 K/uL Final   Basophils Relative 01/16/2023 1  % Final   Basophils Absolute 01/16/2023 0.0  0.0 - 0.1 K/uL Final   Immature Granulocytes 01/16/2023 1  % Final   Abs Immature Granulocytes 01/16/2023 0.03  0.00 - 0.07 K/uL Final   Performed at [facility], [address] [place], [place] [address]    PSYCHIATRIC REVIEW OF SYSTEMS (ROS)  ROS: Notable for the following relevant positive findings: ROS  Additional findings:      Musculoskeletal: No abnormal  movements observed      Gait & Station: Laying/Sitting      Pain Screening: Denies        RISK FORMULATION/ASSESSMENT  Is the patient experiencing any suicidal or homicidal ideations: No  Protective factors considered for safety management: Help seeking   Risk factors/concerns considered for safety management:  Substance abuse/dependence Male gender  Is there a safety management plan with the patient and treatment team to minimize risk factors and promote protective factors: Yes           Explain: Medication, inpatient admission followed by referral to outpatient services  Is crisis care placement or psychiatric hospitalization recommended: Yes     Based on my current evaluation and risk assessment, patient is determined at this time to be at:  Low risk  *RISK ASSESSMENT Risk assessment is a dynamic process; it is possible that this patient's condition, and risk level, may change. This should be re-evaluated and managed over time as appropriate. Please re-consult psychiatric consult services if additional assistance is needed in terms of risk assessment and management. If your team decides to discharge this patient, please advise the patient how to best access emergency psychiatric services, or to call 911, if their condition worsens or they feel unsafe in any way.   Dian Situ, MD Telepsychiatry Consult ServicesPatient ID: [name], male   DOB: 1965/11/20, 57 y.o.   MRN: [id number]

## 2023-01-17 NOTE — Progress Notes (Signed)
Hypophosphatemia: Labs showing low Phos 2.1.  Magnesium 1.6 WNL - As patient is very drowsy and unable to give anything oral giving IV sodium phosphate 30 mmol.

## 2023-01-17 NOTE — ED Notes (Signed)
Pt ambulated to room 3. Pt placed in gown and placed on full bedside cardiac monitor. Pt updated on plan. No needs expressed at this time. Pts belongings transferred to room 3 with patient.

## 2023-01-18 ENCOUNTER — Telehealth: Payer: Self-pay | Admitting: *Deleted

## 2023-01-18 DIAGNOSIS — E876 Hypokalemia: Secondary | ICD-10-CM | POA: Diagnosis not present

## 2023-01-18 LAB — CBC
HCT: 38.1 % — ABNORMAL LOW (ref 39.0–52.0)
Hemoglobin: 13.1 g/dL (ref 13.0–17.0)
MCH: 31.6 pg (ref 26.0–34.0)
MCHC: 34.4 g/dL (ref 30.0–36.0)
MCV: 92 fL (ref 80.0–100.0)
Platelets: 325 10*3/uL (ref 150–400)
RBC: 4.14 MIL/uL — ABNORMAL LOW (ref 4.22–5.81)
RDW: 13.5 % (ref 11.5–15.5)
WBC: 5.1 10*3/uL (ref 4.0–10.5)
nRBC: 0 % (ref 0.0–0.2)

## 2023-01-18 LAB — COMPREHENSIVE METABOLIC PANEL
ALT: 23 U/L (ref 0–44)
AST: 31 U/L (ref 15–41)
Albumin: 2.8 g/dL — ABNORMAL LOW (ref 3.5–5.0)
Alkaline Phosphatase: 50 U/L (ref 38–126)
Anion gap: 13 (ref 5–15)
BUN: 10 mg/dL (ref 6–20)
CO2: 26 mmol/L (ref 22–32)
Calcium: 8.9 mg/dL (ref 8.9–10.3)
Chloride: 101 mmol/L (ref 98–111)
Creatinine, Ser: 0.95 mg/dL (ref 0.61–1.24)
GFR, Estimated: >60 mL/min (ref >60)
Glucose, Bld: 168 mg/dL — ABNORMAL HIGH (ref 70–99)
Potassium: 3.1 mmol/L — ABNORMAL LOW (ref 3.5–5.1)
Sodium: 140 mmol/L (ref 135–145)
Total Bilirubin: 0.3 mg/dL (ref 0.3–1.2)
Total Protein: 6.4 g/dL — ABNORMAL LOW (ref 6.5–8.1)

## 2023-01-18 LAB — GLUCOSE, CAPILLARY: Glucose-Capillary: 105 mg/dL — ABNORMAL HIGH (ref 70–99)

## 2023-01-18 MED ORDER — POTASSIUM CHLORIDE 10 MEQ/100ML IV SOLN
10.0000 meq | INTRAVENOUS | Status: DC
Start: 2023-01-18 — End: 2023-01-18
  Administered 2023-01-18: 10 meq via INTRAVENOUS
  Filled 2023-01-18: qty 100

## 2023-01-18 MED ORDER — MAGNESIUM SULFATE 2 GM/50ML IV SOLN
2.0000 g | Freq: Once | INTRAVENOUS | Status: AC
  Administered 2023-01-18: 2 g via INTRAVENOUS
  Filled 2023-01-18: qty 50

## 2023-01-18 MED ORDER — POTASSIUM CHLORIDE CRYS ER 20 MEQ PO TBCR
40.0000 meq | EXTENDED_RELEASE_TABLET | Freq: Once | ORAL | Status: AC
  Administered 2023-01-18: 40 meq via ORAL
  Filled 2023-01-18: qty 2

## 2023-01-18 NOTE — Assessment & Plan Note (Addendum)
Phosphorous was low at 2.1 on admission and is being supplementation. Will continue to monitor and supplement as necessary. Now resolved at 2.8.  01-20-2023 resolved

## 2023-01-18 NOTE — Progress Notes (Signed)
Progress Note   Patient: Francisco Bates ION:[id number] DOB: 10/27/1965 DOA: 01/16/2023     0 DOS: the patient was seen and examined on 01/18/2023   Brief hospital course: ZALAN HENNINGSEN is a 57 y.o. male with medical history significant of essential hypertension and chronic alcohol use presented to emergency department for evaluation for hallucination.  Patient reported he has visual hallucination for last 1 to 2 weeks as well as paranoid thought that people are coming after him.  Patient never had similar episodes before.  Denies any family history of psychiatric disturbance.  Patient denies any suicidal ideation or thoughts.  Denies any homicidal ideation and thought.  No prior psychiatric hospitalization.  Upon initial evaluation at bedside in the ED, the patient is very sleepy as he received his Zyprexa 2 hours ago.  Unable to get any good story from the patient.  However he denies any vomiting and diarrhea.  Patient is unclear/unsure about drinking alcohol and about his last drink.  Upon my visit on 01/17/2023 the patient is somnolent and stirs slightly and mumbles to my vocal attempts to rouse him.  Later in the afternoon I was able to speak with the patient on the phone. At this time he endorsed that he wanted inpatient psychiatric treatment for his psychiatric issues. I explained that he would remain inpatient overnight due to irregularities in his electrolytes and to evaluate his response to antipsychotics.   The patient's potassium is 3.1 this morning after supplementation yesterday. Will again supplement and continue to follow.  Magnesium is 1.7. Will also supplement magnesium. Phosphorus is replete.    Assessment and Plan: Hypokalemia The patient's potassium initially improved from 2.6 on admission to 3.2 on 01/17/2023. This morning after supplementation throughout 01/17/2023 his potassium is 3.1 and magnesium is 1.7. Phosphorus is replete at 2.8. He is requiring ongoing  supplementation. Will continue to closely monitor and supplement as necessary.  Hypophosphatemia Phosphorous was low at 2.1 on admission and is being supplementation. Will continue to monitor and supplement as necessary. Now resolved at 2.8.  Psychosis (HCC) The patient presented to the ED with complaints of hallucinations for the past 2 weeks. He has been evaluated by psychiatry. They have started the patient on zyprexa. They have stated that they support the patient's treatment in an inpatient psychiatric facility primarily because of the patient's fear of going home and getting worse due to hallucinations and his mental illness. They state that if the patient were to state that he felt comfortable going home with outpatient psychiatric follow up, they would support this as well.  Chronic alcohol use The patient's blood alcohol level upon presentation was < 10. This raises the question of the role of alcohol withdrawal in his symptoms. However, the patient states that the hallucinations have been going on for 2 weeks which takes this presentation outside the timeframe of alcohol withdrawal.  Essential hypertension The patient will be continued on his home medications of amlodipine to control his blood pressure while inpatient. He is normotensive.     Subjective: The patient is awake and alert. No new complaints.   Physical Exam: Vitals:   01/18/23 0751 01/18/23 1011 01/18/23 1150 01/18/23 1544  BP: (!) 142/78 (!) 145/73 123/68 (!) 140/78  Pulse: 84 87 82 86  Resp: 16  16 16   Temp: 99 F (37.2 C)  98.8 F (37.1 C) 99.1 F (37.3 C)  TempSrc: Oral  Oral Oral  SpO2: 100%  96% 96%  Weight:  Height:       Exam:  Constitutional:  The patient is wake and alert. No new complaints.  Respiratory:  No increased work of breathing. No wheezes, rales, or rhonchi No tactile fremitus Cardiovascular:  Regular rate and rhythm No murmurs, ectopy, or gallups. No lateral PMI. No  thrills. Abdomen:  Abdomen is soft, non-tender, non-distended No hernias, masses, or organomegaly Normoactive bowel sounds.  Musculoskeletal:  No cyanosis, clubbing, or edema Skin:  No rashes, lesions, ulcers palpation of skin: no induration or nodules Neurologic:  Unable to evaluate as patient is unable to cooperate with exam. Psychiatric:  Unable to evaluate as patient is unable to cooperate with exam.  Data Reviewed:    CBC BMP  Family Communication: None available.  Disposition: Status is: Observation The patient remains OBS appropriate and will d/c before 2 midnights.  Planned Discharge Destination:  Inpatient psychiatry    Time spent: 38 minutes  Author: Aicia Babinski, DO 01/18/2023 4:57 PM  For on call review [email or web address].

## 2023-01-18 NOTE — Plan of Care (Signed)

## 2023-01-18 NOTE — Assessment & Plan Note (Signed)
The patient presented to the ED with complaints of hallucinations for the past 2 weeks. He has been evaluated by psychiatry. They have started the patient on zyprexa. They have stated that they support the patient's treatment in an inpatient psychiatric facility primarily because of the patient's fear of going home and getting worse due to hallucinations and his mental illness. They state that if the patient were to state that he felt comfortable going home with outpatient psychiatric follow up, they would support this as well.  01-20-2023 psych does not think pt requires inpatient [facility] management. Discharge with 5 mg po zyprexa at night and f/u with outpatient BH.

## 2023-01-18 NOTE — Telephone Encounter (Signed)
Dr. Adela Lank,  This pt is scheduled with you on 10/30.  He is current hospitalized for hypokalemia.  Probably will not be ready to prep this week.  Your thoughts?  Thanks,  Cathlyn Parsons

## 2023-01-18 NOTE — Plan of Care (Signed)

## 2023-01-18 NOTE — Assessment & Plan Note (Addendum)
The patient's potassium initially improved from 2.6 on admission to 3.2 on 01/17/2023. This morning after supplementation throughout 01/17/2023 his potassium is 3.1 and magnesium is 1.7. Phosphorus is replete at 2.8. He is requiring ongoing supplementation. Will continue to closely monitor and supplement as necessary.  01-20-2023 resolved.

## 2023-01-18 NOTE — Assessment & Plan Note (Signed)
The patient's blood alcohol level upon presentation was < 10. This raises the question of the role of alcohol withdrawal in his symptoms. However, the patient states that the hallucinations have been going on for 2 weeks which takes this presentation outside the timeframe of alcohol withdrawal.  01-20-2023 stable.

## 2023-01-18 NOTE — Assessment & Plan Note (Addendum)
The patient will be continued on his home medications of amlodipine to control his blood pressure while inpatient. He is normotensive.  01-20-2023 stable

## 2023-01-19 DIAGNOSIS — Z789 Other specified health status: Secondary | ICD-10-CM

## 2023-01-19 DIAGNOSIS — I10 Essential (primary) hypertension: Secondary | ICD-10-CM

## 2023-01-19 DIAGNOSIS — F29 Unspecified psychosis not due to a substance or known physiological condition: Secondary | ICD-10-CM

## 2023-01-19 DIAGNOSIS — E876 Hypokalemia: Secondary | ICD-10-CM | POA: Diagnosis not present

## 2023-01-19 LAB — COMPREHENSIVE METABOLIC PANEL WITH GFR
ALT: 23 U/L (ref 0–44)
AST: 26 U/L (ref 15–41)
Albumin: 2.9 g/dL — ABNORMAL LOW (ref 3.5–5.0)
Alkaline Phosphatase: 62 U/L (ref 38–126)
Anion gap: 7 (ref 5–15)
BUN: 14 mg/dL (ref 6–20)
CO2: 23 mmol/L (ref 22–32)
Calcium: 8.4 mg/dL — ABNORMAL LOW (ref 8.9–10.3)
Chloride: 103 mmol/L (ref 98–111)
Creatinine, Ser: 1 mg/dL (ref 0.61–1.24)
GFR, Estimated: >60 mL/min
Glucose, Bld: 111 mg/dL — ABNORMAL HIGH (ref 70–99)
Potassium: 3.5 mmol/L (ref 3.5–5.1)
Sodium: 133 mmol/L — ABNORMAL LOW (ref 135–145)
Total Bilirubin: 0.4 mg/dL (ref 0.3–1.2)
Total Protein: 6.9 g/dL (ref 6.5–8.1)

## 2023-01-19 LAB — CBC
HCT: 38.8 % — ABNORMAL LOW (ref 39.0–52.0)
Hemoglobin: 13 g/dL (ref 13.0–17.0)
MCH: 31.3 pg (ref 26.0–34.0)
MCHC: 33.5 g/dL (ref 30.0–36.0)
MCV: 93.5 fL (ref 80.0–100.0)
Platelets: 322 10*3/uL (ref 150–400)
RBC: 4.15 MIL/uL — ABNORMAL LOW (ref 4.22–5.81)
RDW: 13.3 % (ref 11.5–15.5)
WBC: 6.7 10*3/uL (ref 4.0–10.5)
nRBC: 0 % (ref 0.0–0.2)

## 2023-01-19 LAB — GLUCOSE, CAPILLARY: Glucose-Capillary: 150 mg/dL — ABNORMAL HIGH (ref 70–99)

## 2023-01-19 LAB — MAGNESIUM: Magnesium: 1.6 mg/dL — ABNORMAL LOW (ref 1.7–2.4)

## 2023-01-19 MED ORDER — POTASSIUM CHLORIDE CRYS ER 20 MEQ PO TBCR
40.0000 meq | EXTENDED_RELEASE_TABLET | Freq: Once | ORAL | Status: AC
  Administered 2023-01-19: 40 meq via ORAL
  Filled 2023-01-19: qty 2

## 2023-01-19 MED ORDER — MAGNESIUM OXIDE -MG SUPPLEMENT 400 (240 MG) MG PO TABS
400.0000 mg | ORAL_TABLET | Freq: Every day | ORAL | Status: DC
  Administered 2023-01-19: 400 mg via ORAL
  Filled 2023-01-19 (×2): qty 1

## 2023-01-19 NOTE — Telephone Encounter (Signed)
Thanks Francisco Bates.  I reviewed his chart. He is currently admitted with psychosis and they have been treating him for low K which has been repleted.  It looks like from review of the chart he may be transferring to an inpatient psychiatric facility. Even if not, I doubt he would be ready for a procedure on 10/30. Thanks for letting me know.  Dottie can you contact the patient or his family and let them  know due to recent hospitalization I do not think it is a good time for him to have procedures done (he remains hospitalized today). I recommend we postpone his case, we can book him for another time when he is feeling better he can contact us to reschedule.  [facility] staff - Lorain Childes

## 2023-01-19 NOTE — Telephone Encounter (Signed)
Spoke to Francisco Bates to advise that we would like patient to recover from hospitalization and call us back at a later time to reschedule his endoscopy/colonoscopy appointment. Nicole verbalizes understanding.

## 2023-01-19 NOTE — Plan of Care (Signed)

## 2023-01-19 NOTE — TOC Initial Note (Addendum)
Transition of Care [facility]) - Initial/Assessment Note    Patient Details  Name: Francisco Bates MRN: [id number] Date of Birth: 03/18/1966  Transition [facility]) CM/SW Contact:    Kristene Liberati A [place], Theresia Majors Phone Number: 01/19/2023, 3:46 PM  Clinical Narrative:                  CSW met with pt at bedside, he stated that he is still agreeable to possible inpatient psychiatric hospital at discharge. He stated that the does have his medication managed but would be ok with referral to outpatient behavioral health at discharge if needed.   Pt stated that he had a friend who could assist with transportation home if pt returned their at discharge but they would need to pick him up after they got off work.   TOC will continue to follow.   Expected Discharge Plan: Psychiatric Hospital Barriers to Discharge: Continued Medical Work up, Psych Bed not available   Patient Goals and CMS Choice            Expected Discharge Plan and Services       Living arrangements for the past 2 months: Single Family Home                                      Prior Living Arrangements/Services Living arrangements for the past 2 months: Single Family Home Lives with:: Self                   Activities of Daily Living      Permission Sought/Granted                  Emotional Assessment Appearance:: Appears stated age Attitude/Demeanor/Rapport: Gracious Affect (typically observed): Flat Orientation: : Oriented to Self, Oriented to Place, Oriented to  Time, Oriented to Situation Alcohol / Substance Use: Illicit Drugs Psych Involvement: Yes (comment) (psych following, possible DC to inpatient psych hospital)  Admission diagnosis:  Hallucinations [R44.3] Hypokalemia [E87.6] Patient Active Problem List   Diagnosis Date Noted   Hypophosphatemia 01/18/2023   Hypokalemia 01/17/2023   Psychosis (HCC) 01/17/2023   Essential hypertension 01/17/2023   Chronic alcohol use 01/17/2023    Intractable nausea and vomiting 09/18/2022   Chronic alcohol abuse    Alcohol withdrawal (HCC) 10/19/2014   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 [place] [address] Phone: [phone] Fax: [phone]     Social Determinants of Health (SDOH) Social History: SDOH Screenings   Food Insecurity: No Food Insecurity (01/09/2023)  Housing: Low Risk  (01/09/2023)  Transportation Needs: No Transportation Needs (01/09/2023)  Utilities: Not At Risk (09/19/2022)  Alcohol Screen: Medium Risk (01/09/2023)  Depression (PHQ2-9): Low Risk  (01/09/2023)  Financial Resource Strain: Low Risk  (01/09/2023)  Physical Activity: Insufficiently Active (01/09/2023)  Social Connections: Unknown (01/09/2023)  Stress: No Stress Concern Present (01/09/2023)  Tobacco Use: Low Risk  (01/17/2023)   SDOH Interventions:     Readmission Risk Interventions    09/22/2022   11:45 AM  Readmission Risk Prevention Plan  Post Dischage Appt Complete  Medication Screening Complete  Transportation Screening Complete

## 2023-01-19 NOTE — TOC Progression Note (Signed)
Transition of Care [facility]) - Progression Note    Patient Details  Name: Francisco Bates MRN: [id number] Date of Birth: 25-Jan-1966  Transition of Care [facility]) CM/SW Contact  Elliette Seabolt A [place], [place] Phone Number: 01/19/2023, 11:30 AM  Clinical Narrative:     CSW made attempt to speak with pt to complete assessment, pt was sound sleep and could not be roused. CSW to make attempt at another more opportune time today.   TOC will continue to follow.        Expected Discharge Plan and Services                                               Social Determinants of Health (SDOH) Interventions SDOH Screenings   Food Insecurity: No Food Insecurity (01/09/2023)  Housing: Low Risk  (01/09/2023)  Transportation Needs: No Transportation Needs (01/09/2023)  Utilities: Not At Risk (09/19/2022)  Alcohol Screen: Medium Risk (01/09/2023)  Depression (PHQ2-9): Low Risk  (01/09/2023)  Financial Resource Strain: Low Risk  (01/09/2023)  Physical Activity: Insufficiently Active (01/09/2023)  Social Connections: Unknown (01/09/2023)  Stress: No Stress Concern Present (01/09/2023)  Tobacco Use: Low Risk  (01/17/2023)    Readmission Risk Interventions    09/22/2022   11:45 AM  Readmission Risk Prevention Plan  Post Dischage Appt Complete  Medication Screening Complete  Transportation Screening Complete

## 2023-01-19 NOTE — Telephone Encounter (Signed)
I have left a message for both the patient and significant other, Joni Reining to call back. Current 01/21/23 endoscopy/colonoscopy has been cancelled due to hospitalization.

## 2023-01-19 NOTE — Progress Notes (Signed)
Mobility Specialist Progress Note:    01/19/23 1155  Mobility  Activity Ambulated with assistance in hallway  Level of Assistance Contact guard assist, steadying assist  Assistive Device None  Distance Ambulated (ft) 120 ft  Activity Response Tolerated well  Mobility Referral Yes  $Mobility charge 1 Mobility  Mobility Specialist Start Time (ACUTE ONLY) 1145  Mobility Specialist Stop Time (ACUTE ONLY) 1152  Mobility Specialist Time Calculation (min) (ACUTE ONLY) 7 min   Pt received in bed, agreeable to mobility session. Tolerated well, asx throughout. Returned pt to room, all needs met.   Feliciana Rossetti Mobility Specialist Please contact via Special educational needs teacher or  Rehab office at [phone]

## 2023-01-19 NOTE — Progress Notes (Signed)
Progress Note   Patient: Francisco Bates:[id number] DOB: May 13, 1965 DOA: 01/16/2023     0 DOS: the patient was seen and examined on 01/19/2023   Brief hospital course: Francisco Bates is a 57 y.o. male with medical history significant of essential hypertension and chronic alcohol use presented to emergency department for evaluation for hallucination.  Patient reported he has visual hallucination for last 1 to 2 weeks as well as paranoid thought that people are coming after him. No prior psychiatry history.  Patient is admitted to hospitalist service for evaluation of psychosis, electrolyte abnormalities.  Patient is evaluated by psychiatry team who recommended Zyprexa and if patient does not feel safe to go home advised inpatient psychiatric hospitalization.  I did place a consult for psychiatry to follow-up on safe disposition.  Assessment and Plan: * Hypokalemia His potassium of 1 presentation 2.6 improved to 3.5 today morning. Will give 40 mEq of potassium. Mag level 1.6, started on daily mag supplements. Continue to monitor daily electrolytes.  Hypophosphatemia Phosphorus improved with replacement.  Psychosis (HCC) Visual hallucinations Patient is started on Zyprexa therapy.  Psychiatric evaluated him advised inpatient psychiatric facility primarily as she is fearful of going home and getting worse due to hallucinations and his mental illness.  They also mentioned if he is comfortable going home with outpatient psychiatric follow up, they would support this as well. Patient still has visual hallucinations during my exam. I consulted psychiatry team to follow-up on safe disposition plan.  Chronic alcohol use No withdrawal symptoms.  On CIWA protocol. Hallucinations unlikely due to withdrawal. Continue thiamine, folate and multivitamin.  Essential hypertension Blood pressure stable, continue amlodipine therapy.     Out of bed to chair. Incentive spirometry. Nursing  supportive care. Fall, aspiration precautions. DVT prophylaxis   Code Status: Full Code  Subjective: Patient is seen and examined today morning. He wishes to sleep, states did not sleep last night. When asked about visual hallucinations, he does see people and states he is trying not to seen them.  Physical Exam: Vitals:   01/19/23 0548 01/19/23 0823 01/19/23 1203 01/19/23 1606  BP: 135/86 (!) 154/93 121/69 125/70  Pulse: 82 78 85 93  Resp: 18 16 18 18   Temp: 98.2 F (36.8 C) 99 F (37.2 C) 98.4 F (36.9 C) 99.1 F (37.3 C)  TempSrc: Oral Oral Oral Oral  SpO2: 98% 95% 97% 97%  Weight: 108 kg     Height:        General - Elderly African American male, wishes to close eyes, able to answer me appropriately. HEENT - PERRLA, EOMI, atraumatic head, non tender sinuses. Lung - Clear, basal rales, no rhonchi, wheezes. Heart - S1, S2 heard, no murmurs, rubs, trace pedal edema. Abdomen - Soft, non tender, bowel sounds good Neuro - Alert, awake and oriented x 3, non focal exam. Skin - Warm and dry.  Data Reviewed:      Latest Ref Rng & Units 01/19/2023    1:58 AM 01/18/2023    6:09 AM 01/16/2023   12:21 PM  CBC  WBC 4.0 - 10.5 K/uL 6.7  5.1  5.9   Hemoglobin 13.0 - 17.0 g/dL 81.1  91.4  78.2   Hematocrit 39.0 - 52.0 % 38.8  38.1  39.6   Platelets 150 - 400 K/uL 322  325  324       Latest Ref Rng & Units 01/19/2023    1:58 AM 01/18/2023    6:09 AM 01/17/2023  5:49 PM  BMP  Glucose 70 - 99 mg/dL 478  295  98   BUN 6 - 20 mg/dL 14  10  9    Creatinine 0.61 - 1.24 mg/dL 6.21  3.08  6.57   Sodium 135 - 145 mmol/L 133  140  137   Potassium 3.5 - 5.1 mmol/L 3.5  3.1  3.2   Chloride 98 - 111 mmol/L 103  101  102   CO2 22 - 32 mmol/L 23  26  26    Calcium 8.9 - 10.3 mg/dL 8.4  8.9  8.8    No results found.   Family Communication: Discussed with patient, he understands and agrees. All questions answereed.    Disposition: Status is: Observation The patient remains OBS  appropriate and will d/c before 2 midnights.  Planned Discharge Destination: Barriers to discharge: psychiatry follow up for safe disposition     Time spent: 37 minutes  Author: Marcelino Duster, MD 01/19/2023 4:56 PM Secure chat 7am to 7pm For on call review [email or web address].

## 2023-01-19 NOTE — Plan of Care (Signed)

## 2023-01-19 NOTE — TOC Progression Note (Signed)
Transition of Care [facility]) - Progression Note    Patient Details  Name: Francisco Bates MRN: [id number] Date of Birth: 1966/03/18  Transition of Care [facility]) CM/SW Contact  [name], [place] Phone Number: 01/19/2023, 4:10 PM  Clinical Narrative:     CSW set up pt with outpatient appointment to [facility] outpatient. In pt's AVS.   TOC will continue to follow.   Expected Discharge Plan: Psychiatric Hospital Barriers to Discharge: Continued Medical Work up, Psych Bed not available  Expected Discharge Plan and Services       Living arrangements for the past 2 months: Single Family Home                                       Social Determinants of Health (SDOH) Interventions SDOH Screenings   Food Insecurity: No Food Insecurity (01/09/2023)  Housing: Low Risk  (01/09/2023)  Transportation Needs: No Transportation Needs (01/09/2023)  Utilities: Not At Risk (09/19/2022)  Alcohol Screen: Medium Risk (01/09/2023)  Depression (PHQ2-9): Low Risk  (01/09/2023)  Financial Resource Strain: Low Risk  (01/09/2023)  Physical Activity: Insufficiently Active (01/09/2023)  Social Connections: Unknown (01/09/2023)  Stress: No Stress Concern Present (01/09/2023)  Tobacco Use: Low Risk  (01/17/2023)    Readmission Risk Interventions    09/22/2022   11:45 AM  Readmission Risk Prevention Plan  Post Dischage Appt Complete  Medication Screening Complete  Transportation Screening Complete

## 2023-01-20 ENCOUNTER — Encounter (HOSPITAL_COMMUNITY): Payer: Self-pay | Admitting: Psychiatry

## 2023-01-20 ENCOUNTER — Other Ambulatory Visit: Payer: Self-pay

## 2023-01-20 ENCOUNTER — Inpatient Hospital Stay (HOSPITAL_COMMUNITY)
Admission: AD | Admit: 2023-01-20 | Discharge: 2023-01-23 | DRG: 885 | Disposition: A | Discharge status: Home / Self Care | Payer: BC Managed Care – PPO | Source: Intra-hospital | Attending: Psychiatry | Admitting: Psychiatry

## 2023-01-20 DIAGNOSIS — I10 Essential (primary) hypertension: Secondary | ICD-10-CM | POA: Diagnosis not present

## 2023-01-20 DIAGNOSIS — K219 Gastro-esophageal reflux disease without esophagitis: Secondary | ICD-10-CM | POA: Diagnosis present

## 2023-01-20 DIAGNOSIS — Z8249 Family history of ischemic heart disease and other diseases of the circulatory system: Secondary | ICD-10-CM | POA: Diagnosis not present

## 2023-01-20 DIAGNOSIS — G8929 Other chronic pain: Secondary | ICD-10-CM | POA: Diagnosis present

## 2023-01-20 DIAGNOSIS — F1090 Alcohol use, unspecified, uncomplicated: Secondary | ICD-10-CM | POA: Diagnosis present

## 2023-01-20 DIAGNOSIS — G4700 Insomnia, unspecified: Secondary | ICD-10-CM | POA: Diagnosis present

## 2023-01-20 DIAGNOSIS — F1021 Alcohol dependence, in remission: Secondary | ICD-10-CM | POA: Diagnosis not present

## 2023-01-20 DIAGNOSIS — F332 Major depressive disorder, recurrent severe without psychotic features: Principal | ICD-10-CM | POA: Diagnosis present

## 2023-01-20 DIAGNOSIS — Z6833 Body mass index (BMI) 33.0-33.9, adult: Secondary | ICD-10-CM | POA: Diagnosis not present

## 2023-01-20 DIAGNOSIS — F411 Generalized anxiety disorder: Secondary | ICD-10-CM | POA: Diagnosis not present

## 2023-01-20 DIAGNOSIS — E876 Hypokalemia: Secondary | ICD-10-CM | POA: Diagnosis not present

## 2023-01-20 DIAGNOSIS — R441 Visual hallucinations: Secondary | ICD-10-CM

## 2023-01-20 DIAGNOSIS — F1410 Cocaine abuse, uncomplicated: Secondary | ICD-10-CM | POA: Diagnosis present

## 2023-01-20 DIAGNOSIS — E559 Vitamin D deficiency, unspecified: Secondary | ICD-10-CM | POA: Diagnosis not present

## 2023-01-20 DIAGNOSIS — R9431 Abnormal electrocardiogram [ECG] [EKG]: Secondary | ICD-10-CM | POA: Diagnosis not present

## 2023-01-20 DIAGNOSIS — Z62810 Personal history of physical and sexual abuse in childhood: Secondary | ICD-10-CM

## 2023-01-20 DIAGNOSIS — J45909 Unspecified asthma, uncomplicated: Secondary | ICD-10-CM | POA: Diagnosis not present

## 2023-01-20 DIAGNOSIS — F1594 Other stimulant use, unspecified with stimulant-induced mood disorder: Secondary | ICD-10-CM | POA: Diagnosis not present

## 2023-01-20 DIAGNOSIS — F29 Unspecified psychosis not due to a substance or known physiological condition: Secondary | ICD-10-CM | POA: Diagnosis not present

## 2023-01-20 DIAGNOSIS — Z79899 Other long term (current) drug therapy: Secondary | ICD-10-CM

## 2023-01-20 DIAGNOSIS — E669 Obesity, unspecified: Secondary | ICD-10-CM | POA: Diagnosis not present

## 2023-01-20 LAB — BASIC METABOLIC PANEL
Anion gap: 11 (ref 5–15)
BUN: 14 mg/dL (ref 6–20)
CO2: 20 mmol/L — ABNORMAL LOW (ref 22–32)
Calcium: 8.9 mg/dL (ref 8.9–10.3)
Chloride: 107 mmol/L (ref 98–111)
Creatinine, Ser: 0.79 mg/dL (ref 0.61–1.24)
GFR, Estimated: >60 mL/min (ref >60)
Glucose, Bld: 110 mg/dL — ABNORMAL HIGH (ref 70–99)
Potassium: 3.8 mmol/L (ref 3.5–5.1)
Sodium: 138 mmol/L (ref 135–145)

## 2023-01-20 LAB — GLUCOSE, CAPILLARY
Glucose-Capillary: 104 mg/dL — ABNORMAL HIGH (ref 70–99)
Glucose-Capillary: 105 mg/dL — ABNORMAL HIGH (ref 70–99)
Glucose-Capillary: 98 mg/dL (ref 70–99)

## 2023-01-20 LAB — MAGNESIUM: Magnesium: 1.6 mg/dL — ABNORMAL LOW (ref 1.7–2.4)

## 2023-01-20 MED ORDER — MAGNESIUM OXIDE -MG SUPPLEMENT 400 (240 MG) MG PO TABS
400.0000 mg | ORAL_TABLET | Freq: Two times a day (BID) | ORAL | Status: DC
  Administered 2023-01-20: 400 mg via ORAL

## 2023-01-20 MED ORDER — ADULT MULTIVITAMIN W/MINERALS CH: 1.0000 | ORAL_TABLET | Freq: Every day | ORAL | Status: DC

## 2023-01-20 MED ORDER — MAGNESIUM OXIDE -MG SUPPLEMENT 400 (240 MG) MG PO TABS: 400.0000 mg | ORAL_TABLET | Freq: Two times a day (BID) | ORAL | Status: DC

## 2023-01-20 MED ORDER — DIPHENHYDRAMINE HCL 50 MG/ML IJ SOLN: 50.0000 mg | Freq: Three times a day (TID) | INTRAMUSCULAR | Status: DC | PRN

## 2023-01-20 MED ORDER — PANTOPRAZOLE SODIUM 40 MG PO TBEC
40.0000 mg | DELAYED_RELEASE_TABLET | Freq: Every day | ORAL | Status: DC
  Administered 2023-01-21 – 2023-01-23 (×3): 40 mg via ORAL
  Filled 2023-01-20 (×5): qty 1

## 2023-01-20 MED ORDER — VITAMIN B-1 100 MG PO TABS
100.0000 mg | ORAL_TABLET | Freq: Every day | ORAL | Status: DC
  Administered 2023-01-21 – 2023-01-23 (×3): 100 mg via ORAL
  Filled 2023-01-20 (×5): qty 1

## 2023-01-20 MED ORDER — FOLIC ACID 1 MG PO TABS: 1.0000 mg | ORAL_TABLET | Freq: Every day | ORAL | Status: DC

## 2023-01-20 MED ORDER — OLANZAPINE 5 MG PO TBDP: 5.0000 mg | ORAL_TABLET | Freq: Every day | ORAL | 0 refills | Status: DC

## 2023-01-20 MED ORDER — SENNOSIDES-DOCUSATE SODIUM 8.6-50 MG PO TABS: 1.0000 | ORAL_TABLET | Freq: Every evening | ORAL | Status: DC | PRN

## 2023-01-20 MED ORDER — ACETAMINOPHEN 325 MG PO TABS
650.0000 mg | ORAL_TABLET | Freq: Four times a day (QID) | ORAL | Status: DC | PRN
  Administered 2023-01-20: 650 mg via ORAL
  Filled 2023-01-20: qty 2

## 2023-01-20 MED ORDER — LORAZEPAM 2 MG/ML IJ SOLN: 2.0000 mg | Freq: Three times a day (TID) | INTRAMUSCULAR | Status: DC | PRN

## 2023-01-20 MED ORDER — OLANZAPINE 5 MG PO TBDP: 5.0000 mg | ORAL_TABLET | Freq: Every day | ORAL | Status: DC

## 2023-01-20 MED ORDER — ADULT MULTIVITAMIN W/MINERALS CH
1.0000 | ORAL_TABLET | Freq: Every day | ORAL | Status: DC
  Administered 2023-01-21 – 2023-01-23 (×3): 1 via ORAL
  Filled 2023-01-20 (×5): qty 1

## 2023-01-20 MED ORDER — HALOPERIDOL 5 MG PO TABS
5.0000 mg | ORAL_TABLET | Freq: Three times a day (TID) | ORAL | Status: DC | PRN
Start: 2023-01-20 — End: 2023-01-24

## 2023-01-20 MED ORDER — HALOPERIDOL LACTATE 5 MG/ML IJ SOLN: 5.0000 mg | Freq: Three times a day (TID) | INTRAMUSCULAR | Status: DC | PRN

## 2023-01-20 MED ORDER — ALUM & MAG HYDROXIDE-SIMETH 200-200-20 MG/5ML PO SUSP: 30.0000 mL | ORAL | Status: DC | PRN

## 2023-01-20 MED ORDER — AMLODIPINE BESYLATE 10 MG PO TABS
10.0000 mg | ORAL_TABLET | Freq: Every day | ORAL | Status: DC
  Administered 2023-01-21 – 2023-01-23 (×3): 10 mg via ORAL
  Filled 2023-01-20 (×5): qty 1

## 2023-01-20 MED ORDER — OLANZAPINE 5 MG PO TBDP
5.0000 mg | ORAL_TABLET | Freq: Every day | ORAL | Status: DC
  Administered 2023-01-20: 5 mg via ORAL
  Filled 2023-01-20 (×4): qty 1

## 2023-01-20 MED ORDER — THIAMINE HCL 100 MG/ML IJ SOLN: 100.0000 mg | Freq: Every day | INTRAMUSCULAR | Status: DC

## 2023-01-20 MED ORDER — VITAMIN B-1 100 MG PO TABS: 100.0000 mg | ORAL_TABLET | Freq: Every day | ORAL | Status: DC

## 2023-01-20 MED ORDER — LORAZEPAM 1 MG PO TABS
2.0000 mg | ORAL_TABLET | Freq: Three times a day (TID) | ORAL | Status: DC | PRN
Start: 2023-01-20 — End: 2023-01-24

## 2023-01-20 MED ORDER — FOLIC ACID 1 MG PO TABS
1.0000 mg | ORAL_TABLET | Freq: Every day | ORAL | Status: DC
  Administered 2023-01-21 – 2023-01-23 (×3): 1 mg via ORAL
  Filled 2023-01-20 (×5): qty 1

## 2023-01-20 MED ORDER — DIPHENHYDRAMINE HCL 25 MG PO CAPS
50.0000 mg | ORAL_CAPSULE | Freq: Three times a day (TID) | ORAL | Status: DC | PRN
Start: 2023-01-20 — End: 2023-01-24

## 2023-01-20 MED ORDER — MAGNESIUM OXIDE -MG SUPPLEMENT 400 (240 MG) MG PO TABS
400.0000 mg | ORAL_TABLET | Freq: Two times a day (BID) | ORAL | Status: DC
  Administered 2023-01-20 – 2023-01-23 (×7): 400 mg via ORAL
  Filled 2023-01-20 (×11): qty 1

## 2023-01-20 NOTE — Group Note (Unsigned)
Date:  01/21/2023 Time:  5:09 AM  Group Topic/Focus:  Wrap-Up Group:   The focus of this group is to help patients review their daily goal of treatment and discuss progress on daily workbooks.    Participation Level:  Active  Participation Quality:  Appropriate and Sharing  Affect:  Appropriate  Cognitive:  Appropriate  Insight: Appropriate and Improving  Engagement in Group:  Engaged and Improving  Modes of Intervention:  Activity and Socialization  Additional Comments:  The patient stated that he is having an "okay day". The patient stated that he is "just trying to process why he is here and get insight for himself regarding his mental state". The patient rated his day a 7/10. The patient participated in the group activity at the end of the group.   Francisco Bates 01/21/2023, 5:09 AM

## 2023-01-20 NOTE — Hospital Course (Signed)
HPI: Francisco Bates is a 57 y.o. male with medical history significant of essential hypertension and chronic alcohol use presented to emergency department for evaluation for hallucination.  Patient reported he has visual hallucination for last 1 to 2 weeks as well as paranoid thought that people are coming after him.  Patient never had similar episodes before.  Denies any family history of psychiatric disturbance.  Patient denies any suicidal ideation or thoughts.  Denies any homicidal ideation and thought.  No prior psychiatric hospitalization.   During my evaluation at bedside, patient is very sleepy as he received his Zyprexa 2 hours ago.  Unable to get any good story from the patient.  However he denies any vomiting and diarrhea.  Patient is unclear/unsure about drinking alcohol and about his last drink.   ED Course:  At ED initial presentation patient is hemodynamically stable. Initial CMP showing low potassium 2.6, sodium 138, low chloride 92, transaminitis around baseline. Blood alcohol level less than 10. CBC grossly unremarkable. UDS positive with cocaine.   In the ED patient received oral KCl 40 mEq and IV 20 mEq daily repeat potassium is 2.6.   Psychiatry has been evaluated patient in the ED.  Per psychiatry diagnosis of psychosis and recommended to start olanzapine 10 mg nightly. Also psych it recommended to admit patient under hospitalist care for the management of hypokalemia.  They will continue to follow the patient. Hospitalist has been contacted for management of hypokalemia.  Significant Events: Admitted 01/16/2023 for hypokalemia and psychosis   Significant Labs: Admitting k 2.6 Discharge K 3.8  Significant Imaging Studies:   Antibiotic Therapy: Anti-infectives (From admission, onward)    None       Procedures:   Consultants: psych

## 2023-01-20 NOTE — Tx Team (Signed)
Initial Treatment Plan 01/20/2023 5:47 PM Francisco Bates JOA:[id number]    PATIENT STRESSORS: Health problems   Occupational concerns   Substance abuse     PATIENT STRENGTHS: Ability for insight  Capable of independent living  Communication skills  Supportive family/friends    PATIENT IDENTIFIED PROBLEMS: "Need clarity of mind"  Audiovisual hallucinations  Substance Abuse  Depression  Paranoia  Anxiety  Ineffective coping skills         DISCHARGE CRITERIA:  Ability to meet basic life and health needs Adequate post-discharge living arrangements Motivation to continue treatment in a less acute level of care  PRELIMINARY DISCHARGE PLAN: Attend aftercare/continuing care group Outpatient therapy Return to previous living arrangement  PATIENT/FAMILY INVOLVEMENT: This treatment plan has been presented to and reviewed with the patient, [name], and/or family member.  The patient and family have been given the opportunity to ask questions and make suggestions.  Clarene Critchley, RN 01/20/2023, 5:47 PM

## 2023-01-20 NOTE — TOC Transition Note (Signed)
Transition of Care [facility]) - CM/SW Discharge Note   Patient Details  Name: Francisco Bates MRN: [id number] Date of Birth: [birth year]  Transition [facility] Care [facility]) CM/SW Contact:  Marvette Schamp A [place], Theresia Majors Phone Number: 01/20/2023, 3:54 PM   Clinical Narrative:     Pt to discharge to [facility] for voluntary admission to inpatient psychiatry. Safe transport scheduled. Voluntary consents faxed to facility and in pt's hard chat. CSW notified Joni Reining, pt's significant other of discharge. Nursing called report to [phone].   No other needs identified at this time. TOC will sign off, please consult again if TOC needs arise.    Final next level of care: Psychiatric Hospital Barriers to Discharge: Barriers Resolved   Patient Goals and CMS Choice      Discharge Placement                  Patient to be transferred to facility by: safe transport Name of family member notified: Lowella Bandy Patient and family notified of of transfer: 01/20/23  Discharge Plan and Services Additional resources added to the After Visit Summary for                                       Social Determinants of Health (SDOH) Interventions SDOH Screenings   Food Insecurity: No Food Insecurity (01/20/2023)  Housing: Low Risk  (01/20/2023)  Transportation Needs: No Transportation Needs (01/20/2023)  Utilities: Not At Risk (01/20/2023)  Alcohol Screen: Medium Risk (01/09/2023)  Depression (PHQ2-9): Low Risk  (01/09/2023)  Financial Resource Strain: Low Risk  (01/09/2023)  Physical Activity: Insufficiently Active (01/09/2023)  Social Connections: Unknown (01/09/2023)  Stress: No Stress Concern Present (01/09/2023)  Tobacco Use: Low Risk  (01/17/2023)     Readmission Risk Interventions    09/22/2022   11:45 AM  Readmission Risk Prevention Plan  Post Dischage Appt Complete  Medication Screening Complete  Transportation Screening Complete

## 2023-01-20 NOTE — Plan of Care (Signed)

## 2023-01-20 NOTE — Assessment & Plan Note (Signed)
BMI 33.19

## 2023-01-20 NOTE — Assessment & Plan Note (Addendum)
01-20-2023 pt is on oral mag replacement
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
## 2023-01-20 NOTE — Progress Notes (Addendum)
PROGRESS NOTE    Francisco Bates  MVH:[id number] DOB: [birth year] DOA: 01/16/2023 PCP: Rema Fendt, NP  Subjective: Pt seen and examined. No complaints.   Hospital Course: HPI: Francisco Bates is a 57 y.o. male with medical history significant of essential hypertension and chronic alcohol use presented to emergency department for evaluation for hallucination.  Patient reported he has visual hallucination for last 1 to 2 weeks as well as paranoid thought that people are coming after him.  Patient never had similar episodes before.  Denies any family history of psychiatric disturbance.  Patient denies any suicidal ideation or thoughts.  Denies any homicidal ideation and thought.  No prior psychiatric hospitalization.   During my evaluation at bedside, patient is very sleepy as he received his Zyprexa 2 hours ago.  Unable to get any good story from the patient.  However he denies any vomiting and diarrhea.  Patient is unclear/unsure about drinking alcohol and about his last drink.   ED Course:  At ED initial presentation patient is hemodynamically stable. Initial CMP showing low potassium 2.6, sodium 138, low chloride 92, transaminitis around baseline. Blood alcohol level less than 10. CBC grossly unremarkable. UDS positive with cocaine.   In the ED patient received oral KCl 40 mEq and IV 20 mEq daily repeat potassium is 2.6.   Psychiatry has been evaluated patient in the ED.  Per psychiatry diagnosis of psychosis and recommended to start olanzapine 10 mg nightly. Also psych it recommended to admit patient under hospitalist care for the management of hypokalemia.  They will continue to follow the patient. Hospitalist has been contacted for management of hypokalemia.  Significant Events: Admitted 01/16/2023 for hypokalemia and psychosis   Significant Labs: Admitting k 2.6 Discharge K 3.8  Significant Imaging Studies:   Antibiotic Therapy: Anti-infectives (From admission, onward)     None       Procedures:   Consultants: psych    Assessment and Plan: * Hypokalemia The patient's potassium initially improved from 2.6 on admission to 3.2 on 01/17/2023. This morning after supplementation throughout 01/17/2023 his potassium is 3.1 and magnesium is 1.7. Phosphorus is replete at 2.8. He is requiring ongoing supplementation. Will continue to closely monitor and supplement as necessary.  01-20-2023 resolved.  Hypophosphatemia Phosphorous was low at 2.1 on admission and is being supplementation. Will continue to monitor and supplement as necessary. Now resolved at 2.8.  01-20-2023 resolved  Psychosis (HCC) The patient presented to the ED with complaints of hallucinations for the past 2 weeks. He has been evaluated by psychiatry. They have started the patient on zyprexa. They have stated that they support the patient's treatment in an inpatient psychiatric facility primarily because of the patient's fear of going home and getting worse due to hallucinations and his mental illness. They state that if the patient were to state that he felt comfortable going home with outpatient psychiatric follow up, they would support this as well.  01-20-2023 psych does not think pt requires inpatient [facility] management. Discharge with 5 mg po zyprexa at night and f/u with outpatient BH.  Chronic alcohol use The patient's blood alcohol level upon presentation was < 10. This raises the question of the role of alcohol withdrawal in his symptoms. However, the patient states that the hallucinations have been going on for 2 weeks which takes this presentation outside the timeframe of alcohol withdrawal.  01-20-2023 stable.   Essential hypertension The patient will be continued on his home medications of amlodipine to control his  blood pressure while inpatient. He is normotensive.  01-20-2023 stable  Obesity (BMI 30-39.9) BMI 33.19  Hypomagnesemia 01-20-2023 pt is on oral mag  replacement    DVT prophylaxis: enoxaparin (LOVENOX) injection 40 mg Start: 01/17/23 1000 SCDs Start: 01/17/23 0236    Code Status: Full Code Family Communication: no family at bedside. Pt is decisional Disposition Plan: return home Reason for continuing need for hospitalization: medically stable for DC.  Objective: Vitals:   01/19/23 1951 01/20/23 0028 01/20/23 0425 01/20/23 0809  BP: (!) 148/85 137/88 139/77 135/72  Pulse: 93 93 85 80  Resp: 18 18 18 17   Temp: 98.6 F (37 C) 98.6 F (37 C) 98.7 F (37.1 C) 97.8 F (36.6 C)  TempSrc: Oral     SpO2: 100% 97% 98% 98%  Weight:      Height:       No intake or output data in the 24 hours ending 01/20/23 1334 Filed Weights   01/16/23 0440 01/19/23 0548  Weight: 108 kg 108 kg    Examination:  Physical Exam Vitals and nursing note reviewed.  Constitutional:      General: He is not in acute distress.    Appearance: He is obese. He is not toxic-appearing.  HENT:     Head: Normocephalic and atraumatic.     Nose: Nose normal.  Eyes:     General: No scleral icterus. Cardiovascular:     Rate and Rhythm: Normal rate and regular rhythm.  Pulmonary:     Effort: Pulmonary effort is normal.  Abdominal:     General: Bowel sounds are normal.  Musculoskeletal:     Right lower leg: No edema.     Left lower leg: No edema.  Skin:    General: Skin is warm and dry.     Capillary Refill: Capillary refill takes less than 2 seconds.  Neurological:     Mental Status: He is alert and oriented to person, place, and time.     Data Reviewed: I have personally reviewed following labs and imaging studies  CBC: Recent Labs  Lab 01/16/23 1221 01/18/23 0609 01/19/23 0158  WBC 5.9 5.1 6.7  NEUTROABS 3.3  --   --   HGB 13.8 13.1 13.0  HCT 39.6 38.1* 38.8*  MCV 92.5 92.0 93.5  PLT 324 325 322   Basic Metabolic Panel: Recent Labs  Lab 01/17/23 0245 01/17/23 0733 01/17/23 1749 01/18/23 0609 01/19/23 0158 01/19/23 0543  01/20/23 0544  NA  --  137 137 140 133*  --  138  K  --  3.2* 3.2* 3.1* 3.5  --  3.8  CL  --  100 102 101 103  --  107  CO2  --  28 26 26 23   --  20*  GLUCOSE  --  90 98 168* 111*  --  110*  BUN  --  8 9 10 14   --  14  CREATININE  --  0.78 0.96 0.95 1.00  --  0.79  CALCIUM  --  8.5* 8.8* 8.9 8.4*  --  8.9  MG 1.9  --  1.7  --   --  1.6* 1.6*  PHOS 2.1*  --  2.8  --   --   --   --    GFR: Estimated Creatinine Clearance: 130.5 mL/min (by C-G formula based on SCr of 0.79 mg/dL). Liver Function Tests: Recent Labs  Lab 01/16/23 1221 01/18/23 0609 01/19/23 0158  AST 67* 31 26  ALT 32 23 23  ALKPHOS 53 50 62  BILITOT 1.4* 0.3 0.4  PROT 7.4 6.4* 6.9  ALBUMIN 3.3* 2.8* 2.9*   CBG: Recent Labs  Lab 01/18/23 2100 01/19/23 1952 01/20/23 0025 01/20/23 0427 01/20/23 0809  GLUCAP 105* 150* 98 104* 105*    Radiology Studies: No results found.  Scheduled Meds:  amLODipine  10 mg Oral Daily   enoxaparin (LOVENOX) injection  40 mg Subcutaneous Q24H   folic acid  1 mg Oral Daily   magnesium oxide  400 mg Oral BID   multivitamin with minerals  1 tablet Oral Daily   OLANZapine zydis  5 mg Oral QHS   pantoprazole  40 mg Oral Daily   sodium chloride flush  3 mL Intravenous Q12H   thiamine  100 mg Oral Daily   Or   thiamine  100 mg Intravenous Daily   Continuous Infusions:   LOS: 0 days   Time spent: 35 minutes  Carollee Herter, DO  [facility]  01/20/2023, 1:34 PM

## 2023-01-20 NOTE — Subjective & Objective (Signed)
Pt seen and examined. No complaints.

## 2023-01-20 NOTE — Progress Notes (Signed)
D) Pt received calm, visible, participating in milieu, and in no acute distress. Pt A & O x4. Pt denies SI, HI, A/ V H, depression, anxiety and pain at this time. Pt gave minimal answers to assessment questions. A) Pt encouraged to drink fluids. Pt encouraged to come to staff with needs. Pt encouraged to attend and participate in groups. Pt encouraged to set reachable goals.  R) Pt remained safe on unit, in no acute distress, will continue to assess.     01/20/23 2300  Psych Admission Type (Psych Patients Only)  Admission Status Voluntary/72 hour document signed  Psychosocial Assessment  Patient Complaints Substance abuse;Suspiciousness  Eye Contact Brief  Facial Expression Flat  Affect Flat  Speech Logical/coherent  Interaction Minimal;Guarded  Motor Activity Slow  Appearance/Hygiene In scrubs  Behavior Characteristics Cooperative  Mood Depressed  Thought Process  Coherency WDL  Content WDL  Delusions Paranoid  Perception Hallucinations  Hallucination Visual;Auditory (reported)  Judgment Impaired  Confusion None  Danger to Self  Current suicidal ideation? Denies  Danger to Others  Danger to Others None reported or observed

## 2023-01-20 NOTE — Progress Notes (Signed)
Admission Note: Patient is a 57 year old male admitted to the unit voluntarily from [address] [facility] at [facility].  Patient presents flat and depressed.  Reports seeing people that others don't see and paranoid that people are coming after him.  UDS is positive for Cocaine.  Stated goal is stability and clarity of mind.  Patient is alert and oriented x 4.  Admission plan of care reviewed, consent signed.  Eczema noted on skin.  No contraband found.  Patient oriented to the unit, staff and room.  Routine safety checks initiated.  Verbalizes understanding of unit rules/protocols.  Patient is safe on the unit.

## 2023-01-20 NOTE — Discharge Summary (Signed)
[facility] Phys[facility] Discharge Summary   Patient name: Francisco Bates  Admit date:     01/16/2023  Discharge date: 01/20/2023  Attending Physician: Imogene Burn, Cauy Melody [[id number]]  Discharge Physician: Carollee Herter   PCP: Rema Fendt, NP  Admitted From: Home  Disposition:  Home  Recommendations for Outpatient Follow-up:  Follow up with PCP in 1-2 weeks Follow up with outpatient mental health on 02-16-2023 @ 9 AM  Home Health:No Equipment/Devices: None    Discharge Condition:Stable CODE STATUS:FULL Diet recommendation: Heart Healthy Fluid Restriction: None  Hospital Summary: HPI: Francisco Bates is a 57 y.o. male with medical history significant of essential hypertension and chronic alcohol use presented to emergency department for evaluation for hallucination.  Patient reported he has visual hallucination for last 1 to 2 weeks as well as paranoid thought that people are coming after him.  Patient never had similar episodes before.  Denies any family history of psychiatric disturbance.  Patient denies any suicidal ideation or thoughts.  Denies any homicidal ideation and thought.  No prior psychiatric hospitalization.   During my evaluation at bedside, patient is very sleepy as he received his Zyprexa 2 hours ago.  Unable to get any good story from the patient.  However he denies any vomiting and diarrhea.  Patient is unclear/unsure about drinking alcohol and about his last drink.   ED Course:  At ED initial presentation patient is hemodynamically stable. Initial CMP showing low potassium 2.6, sodium 138, low chloride 92, transaminitis around baseline. Blood alcohol level less than 10. CBC grossly unremarkable. UDS positive with cocaine.   In the ED patient received oral KCl 40 mEq and IV 20 mEq daily repeat potassium is 2.6.   Psychiatry has been evaluated patient in the ED.  Per psychiatry diagnosis of psychosis and recommended to start olanzapine 10 mg nightly. Also psych it  recommended to admit patient under hospitalist care for the management of hypokalemia.  They will continue to follow the patient. Hospitalist has been contacted for management of hypokalemia.  Significant Events: Admitted 01/16/2023 for hypokalemia and psychosis   Significant Labs: Admitting k 2.6 Discharge K 3.8  Significant Imaging Studies:   Antibiotic Therapy: Anti-infectives (From admission, onward)    None       Procedures:   Consultants: psych   Hospital Course by Problem: * Hypokalemia The patient's potassium initially improved from 2.6 on admission to 3.2 on 01/17/2023. This morning after supplementation throughout 01/17/2023 his potassium is 3.1 and magnesium is 1.7. Phosphorus is replete at 2.8. He is requiring ongoing supplementation. Will continue to closely monitor and supplement as necessary.  01-20-2023 resolved.  Hypophosphatemia Phosphorous was low at 2.1 on admission and is being supplementation. Will continue to monitor and supplement as necessary. Now resolved at 2.8.  01-20-2023 resolved  Psychosis (HCC) The patient presented to the ED with complaints of hallucinations for the past 2 weeks. He has been evaluated by psychiatry. They have started the patient on zyprexa. They have stated that they support the patient's treatment in an inpatient psychiatric facility primarily because of the patient's fear of going home and getting worse due to hallucinations and his mental illness. They state that if the patient were to state that he felt comfortable going home with outpatient psychiatric follow up, they would support this as well.  01-20-2023 psych does not think pt requires inpatient [facility] management. Discharge with 5 mg po zyprexa at night and f/u with outpatient BH.  Chronic alcohol use The patient's blood  alcohol level upon presentation was < 10. This raises the question of the role of alcohol withdrawal in his symptoms. However, the patient states  that the hallucinations have been going on for 2 weeks which takes this presentation outside the timeframe of alcohol withdrawal.  01-20-2023 stable.   Essential hypertension The patient will be continued on his home medications of amlodipine to control his blood pressure while inpatient. He is normotensive.  01-20-2023 stable  Obesity (BMI 30-39.9) BMI 33.19  Hypomagnesemia 01-20-2023 pt is on oral mag replacement     Discharge Diagnoses:  Principal Problem:   Hypokalemia Active Problems:   Psychosis (HCC)   Hypophosphatemia   Essential hypertension   Chronic alcohol use   Obesity (BMI 30-39.9)   Hypomagnesemia   Discharge Instructions  Discharge Instructions     Call MD for:  difficulty breathing, headache or visual disturbances   Complete by: As directed    Call MD for:  extreme fatigue   Complete by: As directed    Call MD for:  hives   Complete by: As directed    Call MD for:  persistant dizziness or light-headedness   Complete by: As directed    Call MD for:  persistant nausea and vomiting   Complete by: As directed    Call MD for:  redness, tenderness, or signs of infection (pain, swelling, redness, odor or green/yellow discharge around incision site)   Complete by: As directed    Call MD for:  temperature >100.4   Complete by: As directed    Diet - low sodium heart healthy   Complete by: As directed    Increase activity slowly   Complete by: As directed       Allergies as of 01/20/2023   No Known Allergies      Medication List     TAKE these medications    amLODipine 10 MG tablet Commonly known as: NORVASC Take 1 tablet (10 mg total) by mouth daily.   benzonatate 100 MG capsule Commonly known as: TESSALON Take 100 mg by mouth 3 (three) times daily as needed for cough.   folic acid 1 MG tablet Commonly known as: FOLVITE Take 1 tablet (1 mg total) by mouth daily. Start taking on: January 21, 2023   magnesium oxide 400 (240 Mg) MG  tablet Commonly known as: MAG-OX Take 1 tablet (400 mg total) by mouth 2 (two) times daily.   multivitamin with minerals Tabs tablet Take 1 tablet by mouth daily. Start taking on: January 21, 2023   OLANZapine zydis 5 MG disintegrating tablet Commonly known as: ZYPREXA Take 1 tablet (5 mg total) by mouth at bedtime for 14 days.   pantoprazole 40 MG tablet Commonly known as: PROTONIX Take 1 tablet (40 mg total) by mouth daily. Take 30 minutes before breakfast.   thiamine 100 MG tablet Commonly known as: Vitamin B-1 Take 1 tablet (100 mg total) by mouth daily. Start taking on: January 21, 2023        Follow-up Information     [facility] Follow up on 02/16/2023.   Specialty: Behavioral Health Why: You have a follow up virtual appointment scheduled with Dr. Lolly Mustache on Monday, November 25th at [facility]. Please complete initial appointment paperowrk, that will be emailed, to facility, before your appointment. Please contact, if needed, to reschedule. Thanks! Contact information: [address] [place] [address] [phone]        [facility] Follow up.  Why: Here is an additional resource for follow up if needed. Contact information: [address] [place] [address] [phone]                No Known Allergies  Discharge Exam: Vitals:   01/20/23 0425 01/20/23 0809  BP: 139/77 135/72  Pulse: 85 80  Resp: 18 17  Temp: 98.7 F (37.1 C) 97.8 F (36.6 C)  SpO2: 98% 98%    Physical Exam Vitals and nursing note reviewed.  Constitutional:      General: He is not in acute distress.    Appearance: He is obese. He is not toxic-appearing.  HENT:     Head: Normocephalic and atraumatic.     Nose: Nose normal.  Eyes:     General: No scleral icterus. Cardiovascular:     Rate and Rhythm: Normal rate and regular rhythm.  Pulmonary:     Effort: Pulmonary effort is normal.   Abdominal:     General: Bowel sounds are normal.  Musculoskeletal:     Right lower leg: No edema.     Left lower leg: No edema.  Skin:    General: Skin is warm and dry.     Capillary Refill: Capillary refill takes less than 2 seconds.  Neurological:     Mental Status: He is alert and oriented to person, place, and time.    The results of significant diagnostics from this hospitalization (including imaging, microbiology, ancillary and laboratory) are listed below for reference.    Labs: Basic Metabolic Panel: Recent Labs  Lab 01/17/23 0245 01/17/23 0733 01/17/23 1749 01/18/23 0609 01/19/23 0158 01/19/23 0543 01/20/23 0544  NA  --  137 137 140 133*  --  138  K  --  3.2* 3.2* 3.1* 3.5  --  3.8  CL  --  100 102 101 103  --  107  CO2  --  28 26 26 23   --  20*  GLUCOSE  --  90 98 168* 111*  --  110*  BUN  --  8 9 10 14   --  14  CREATININE  --  0.78 0.96 0.95 1.00  --  0.79  CALCIUM  --  8.5* 8.8* 8.9 8.4*  --  8.9  MG 1.9  --  1.7  --   --  1.6* 1.6*  PHOS 2.1*  --  2.8  --   --   --   --    Liver Function Tests: Recent Labs  Lab 01/16/23 1221 01/18/23 0609 01/19/23 0158  AST 67* 31 26  ALT 32 23 23  ALKPHOS 53 50 62  BILITOT 1.4* 0.3 0.4  PROT 7.4 6.4* 6.9  ALBUMIN 3.3* 2.8* 2.9*   CBC: Recent Labs  Lab 01/16/23 1221 01/18/23 0609 01/19/23 0158  WBC 5.9 5.1 6.7  NEUTROABS 3.3  --   --   HGB 13.8 13.1 13.0  HCT 39.6 38.1* 38.8*  MCV 92.5 92.0 93.5  PLT 324 325 322   CBG: Recent Labs  Lab 01/18/23 2100 01/19/23 1952 01/20/23 0025 01/20/23 0427 01/20/23 0809  GLUCAP 105* 150* 98 104* 105*   Urinalysis    Component Value Date/Time   COLORURINE YELLOW 09/19/2022 1646   APPEARANCEUR CLEAR 09/19/2022 1646   LABSPEC 1.010 09/19/2022 1646   PHURINE 8.0 09/19/2022 1646   GLUCOSEU NEGATIVE 09/19/2022 1646   HGBUR NEGATIVE 09/19/2022 1646   BILIRUBINUR NEGATIVE 09/19/2022 1646   BILIRUBINUR small (A) 09/05/2021 1715   KETONESUR NEGATIVE 09/19/2022  1646   PROTEINUR NEGATIVE 09/19/2022 1646   UROBILINOGEN 1.0 09/05/2021 1715   UROBILINOGEN 1.0 10/19/2014 1339   NITRITE NEGATIVE 09/19/2022 1646   LEUKOCYTESUR NEGATIVE 09/19/2022 1646   Sepsis Labs Recent Labs  Lab 01/16/23 1221 01/18/23 0609 01/19/23 0158  WBC 5.9 5.1 6.7   Procedures/Studies: No results found.  Time coordinating discharge: 40 mins  SIGNED:  Carollee Herter, DO [facility] 01/20/23, 1:38 PM

## 2023-01-20 NOTE — Progress Notes (Signed)
Mobility Specialist Progress Note:    01/20/23 0859  Mobility  Activity Ambulated independently in hallway  Level of Assistance Standby assist, set-up cues, supervision of patient - no hands on  Assistive Device None  Distance Ambulated (ft) 310 ft  Activity Response Tolerated well  Mobility Referral Yes  $Mobility charge 1 Mobility  Mobility Specialist Start Time (ACUTE ONLY) 0859  Mobility Specialist Stop Time (ACUTE ONLY) 0906  Mobility Specialist Time Calculation (min) (ACUTE ONLY) 7 min   Pt received ambulating in room independently, agreeable to mobility session. Tolerated well, asx throughout. Returned pt to room, all needs met.   Feliciana Rossetti Mobility Specialist Please contact via Special educational needs teacher or  Rehab office at [phone]

## 2023-01-21 ENCOUNTER — Encounter: Payer: BC Managed Care – PPO | Admitting: Gastroenterology

## 2023-01-21 ENCOUNTER — Encounter (HOSPITAL_COMMUNITY): Payer: Self-pay

## 2023-01-21 DIAGNOSIS — F1594 Other stimulant use, unspecified with stimulant-induced mood disorder: Secondary | ICD-10-CM | POA: Diagnosis not present

## 2023-01-21 DIAGNOSIS — F1410 Cocaine abuse, uncomplicated: Secondary | ICD-10-CM | POA: Diagnosis present

## 2023-01-21 DIAGNOSIS — G8929 Other chronic pain: Secondary | ICD-10-CM | POA: Diagnosis present

## 2023-01-21 MED ORDER — QUETIAPINE FUMARATE 25 MG PO TABS
25.0000 mg | ORAL_TABLET | Freq: Every day | ORAL | Status: DC
  Administered 2023-01-21 – 2023-01-22 (×2): 25 mg via ORAL
  Filled 2023-01-21 (×4): qty 1

## 2023-01-21 MED ORDER — LOPERAMIDE HCL 2 MG PO CAPS: 2.0000 mg | ORAL_CAPSULE | ORAL | Status: DC | PRN

## 2023-01-21 MED ORDER — VITAMIN B-1 100 MG PO TABS: 100.0000 mg | ORAL_TABLET | Freq: Every day | ORAL | Status: DC

## 2023-01-21 MED ORDER — DULOXETINE HCL 30 MG PO CPEP
30.0000 mg | ORAL_CAPSULE | Freq: Every day | ORAL | Status: DC
  Administered 2023-01-22: 30 mg via ORAL
  Filled 2023-01-21 (×4): qty 1

## 2023-01-21 MED ORDER — ONDANSETRON 4 MG PO TBDP: 4.0000 mg | ORAL_TABLET | Freq: Four times a day (QID) | ORAL | Status: DC | PRN

## 2023-01-21 MED ORDER — HYDROXYZINE HCL 25 MG PO TABS
25.0000 mg | ORAL_TABLET | ORAL | Status: AC
  Administered 2023-01-21: 25 mg via ORAL
  Filled 2023-01-21 (×2): qty 1

## 2023-01-21 MED ORDER — ALBUTEROL SULFATE HFA 108 (90 BASE) MCG/ACT IN AERS: 2.0000 | INHALATION_SPRAY | RESPIRATORY_TRACT | Status: DC | PRN

## 2023-01-21 NOTE — [facility] Suicide Risk Assessment (Signed)
Suicide Risk Assessment  Admission Assessment    [facility] Admission Suicide Risk Assessment   Nursing information obtained from:  Patient Demographic factors:  Male, Low socioeconomic status Current Mental Status:  NA Loss Factors:  Financial problems / change in socioeconomic status Historical Factors:  NA Risk Reduction Factors:  NA  Total Time spent with patient: 1.5 hours Principal Problem: Stimulant-induced mood disorder (HCC) Diagnosis:  Principal Problem:   Stimulant-induced mood disorder (HCC) Active Problems:   Alcohol use disorder   Essential hypertension   Chronic back pain   Cocaine use disorder (HCC)  Subjective Data: Anxiety, depressed mood   Continued Clinical Symptoms: Anxiety and depressed mood, along with insomnia continuing to require inpatient hospitalization for treatment and stabilization prior to discharge.   Alcohol Use Disorder Identification Test Final Score (AUDIT): 8 The "Alcohol Use Disorders Identification Test", Guidelines for Use in Primary Care, Second Edition.  World Science writer [facility]). Score between 0-7:  no or low risk or alcohol related problems. Score between 8-15:  moderate risk of alcohol related problems. Score between 16-19:  high risk of alcohol related problems. Score 20 or above:  warrants further diagnostic evaluation for alcohol dependence and treatment.  CLINICAL FACTORS:   Alcohol/Substance Abuse/Dependencies Currently Psychotic Previous Psychiatric Diagnoses and Treatments  Musculoskeletal: Strength & Muscle Tone: within normal limits Gait & Station: normal Patient leans: N/A  Psychiatric Specialty Exam:  Presentation  General Appearance:  Disheveled  Eye Contact: Good  Speech: Clear and Coherent  Speech Volume: Normal  Handedness: Right   Mood and Affect  Mood: Depressed; Anxious  Affect: Congruent   Thought Process  Thought Processes: Coherent  Descriptions of  Associations:Intact  Orientation:Full (Time, Place and Person)  Thought Content:Logical  History of Schizophrenia/Schizoaffective disorder:No  Duration of Psychotic Symptoms:Less than six months  Hallucinations:Hallucinations: None Description of Auditory Hallucinations: Patient describes seeing a face with a white mask at his window at home.  Ideas of Reference:None  Suicidal Thoughts:Suicidal Thoughts: No  Homicidal Thoughts:Homicidal Thoughts: No   Sensorium  Memory: Recent Fair; Immediate Fair  Judgment: Poor  Insight: Fair   Chartered certified accountant: Fair  Attention Span: Fair  Recall: Fair  Fund of Knowledge: Fair  Language: Good   Psychomotor Activity  Psychomotor Activity: Psychomotor Activity: Normal   Assets  Assets: Housing; Physical Health   Sleep  Sleep: Sleep: Poor    Physical Exam: Physical Exam Review of Systems  Psychiatric/Behavioral:  Positive for depression, hallucinations and substance abuse. Negative for memory loss and suicidal ideas. The patient is nervous/anxious and has insomnia.    Blood pressure 127/88, pulse 96, temperature 99.3 F (37.4 C), temperature source Oral, resp. rate 18, height 5\' 11"  (1.803 m), weight 106.9 kg, SpO2 97%. Body mass index is 32.86 kg/m.   COGNITIVE FEATURES THAT CONTRIBUTE TO RISK:  None    SUICIDE RISK:   Severe:  Frequent, intense, and enduring suicidal ideation, specific plan, no subjective intent, but some objective markers of intent (i.e., choice of lethal method), the method is accessible, some limited preparatory behavior, evidence of impaired self-control, severe dysphoria/symptomatology, multiple risk factors present, and few if any protective factors, particularly a lack of social support.  PLAN OF CARE: See H & P  I certify that inpatient services furnished can reasonably be expected to improve the patient's condition.   Starleen Blue, NP 01/21/2023, 6:48  PM

## 2023-01-21 NOTE — B[facility] Group Notes (Signed)
Adult Psychoeducational Group Note  Date:  01/21/2023 Time:  10:40 PM  Group Topic/Focus:  Wrap-Up Group:   The focus of this group is to help patients review their daily goal of treatment and discuss progress on daily workbooks.  Participation Level:  Active  Participation Quality:  Attentive  Affect:  Appropriate  Cognitive:  Alert  Insight: Improving  Engagement in Group:  Engaged  Modes of Intervention:  Discussion  Additional Comments:  Patient attended and participated in group.  Maura Crandall Cassandra 01/21/2023, 10:40 PM

## 2023-01-21 NOTE — Progress Notes (Signed)
I assumed care for Mr Francisco Bates at about 08:00.  He was resting in bed, alert, in no apparent distress. He has been med compliant, has remained self isolative to his room,did attend one group, denied any avh/hi/si but appears to be depressed. He is being monitored as ordered. No prns needed or requested for.Vital signs WNL.

## 2023-01-21 NOTE — B[facility] Group Notes (Signed)
Spiritual care group facilitated by Chaplain Dyanne Carrel, [facility]  Group focused on topic of strength. Group members reflected on what thoughts and feelings emerge when they hear this topic. They then engaged in facilitated dialog around how strength is present in their lives. This dialog focused on representing what strength had been to them in their lives (images and patterns given) and what they saw as helpful in their life now (what they needed / wanted).  Activity drew on narrative framework.  Patient Progress: Francisco Bates attended group and actively engaged and participated in group conversation and activities.  His comments demonstrated good insight and contributed positively to the group conversation.
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
## 2023-01-21 NOTE — BH IP Treatment Plan (Signed)
Interdisciplinary Treatment and Diagnostic Plan Initial  01/21/2023 Time of Session: 1105 Francisco Bates MRN: [id number]  Principal Diagnosis: MDD (major depressive disorder), recurrent episode, severe (HCC)  Secondary Diagnoses: Principal Problem:   MDD (major depressive disorder), recurrent episode, severe (HCC)   Current Medications:  Current Facility-Administered Medications  Medication Dose Route Frequency Provider Last Rate Last Admin   acetaminophen (TYLENOL) tablet 650 mg  650 mg Oral Q6H PRN Maryagnes Amos, FNP   650 mg at 01/20/23 2115   alum & mag hydroxide-simeth (MAALOX/MYLANTA) 200-200-20 MG/5ML suspension 30 mL  30 mL Oral Q4H PRN Maryagnes Amos, FNP       amLODipine (NORVASC) tablet 10 mg  10 mg Oral Daily Maryagnes Amos, FNP   10 mg at 01/21/23 8413   diphenhydrAMINE (BENADRYL) capsule 50 mg  50 mg Oral TID PRN Maryagnes Amos, FNP       Or   diphenhydrAMINE (BENADRYL) injection 50 mg  50 mg Intramuscular TID PRN Starkes-Perry, Juel Burrow, FNP       folic acid (FOLVITE) tablet 1 mg  1 mg Oral Daily Maryagnes Amos, FNP   1 mg at 01/21/23 2440   haloperidol (HALDOL) tablet 5 mg  5 mg Oral TID PRN Maryagnes Amos, FNP       Or   haloperidol lactate (HALDOL) injection 5 mg  5 mg Intramuscular TID PRN Starkes-Perry, Juel Burrow, FNP       LORazepam (ATIVAN) tablet 2 mg  2 mg Oral TID PRN Maryagnes Amos, FNP       Or   LORazepam (ATIVAN) injection 2 mg  2 mg Intramuscular TID PRN Maryagnes Amos, FNP       magnesium oxide (MAG-OX) tablet 400 mg  400 mg Oral BID Maryagnes Amos, FNP   400 mg at 01/21/23 1027   multivitamin with minerals tablet 1 tablet  1 tablet Oral Daily Maryagnes Amos, FNP   1 tablet at 01/21/23 0822   OLANZapine zydis (ZYPREXA) disintegrating tablet 5 mg  5 mg Oral QHS Maryagnes Amos, FNP   5 mg at 01/20/23 2116   pantoprazole (PROTONIX) EC tablet 40 mg  40 mg Oral Daily  Maryagnes Amos, FNP   40 mg at 01/21/23 2536   senna-docusate (Senokot-S) tablet 1 tablet  1 tablet Oral QHS PRN Maryagnes Amos, FNP       thiamine (Vitamin B-1) tablet 100 mg  100 mg Oral Daily Maryagnes Amos, FNP   100 mg at 01/21/23 6440   Or   thiamine (VITAMIN B1) injection 100 mg  100 mg Intravenous Daily Maryagnes Amos, FNP       PTA Medications: Medications Prior to Admission  Medication Sig Dispense Refill Last Dose   amLODipine (NORVASC) 10 MG tablet Take 1 tablet (10 mg total) by mouth daily. 30 tablet 3    benzonatate (TESSALON) 100 MG capsule Take 100 mg by mouth 3 (three) times daily as needed for cough.      folic acid (FOLVITE) 1 MG tablet Take 1 tablet (1 mg total) by mouth daily.      magnesium oxide (MAG-OX) 400 (240 Mg) MG tablet Take 1 tablet (400 mg total) by mouth 2 (two) times daily.      Multiple Vitamin (MULTIVITAMIN WITH MINERALS) TABS tablet Take 1 tablet by mouth daily.      OLANZapine zydis (ZYPREXA) 5 MG disintegrating tablet Take 1 tablet (5 mg total) by mouth at  bedtime for 14 days. 14 tablet 0    pantoprazole (PROTONIX) 40 MG tablet Take 1 tablet (40 mg total) by mouth daily. Take 30 minutes before breakfast. 30 tablet 1    thiamine (VITAMIN B-1) 100 MG tablet Take 1 tablet (100 mg total) by mouth daily.       Patient Stressors: Health problems   Occupational concerns   Substance abuse    Patient Strengths: Ability for insight  Capable of independent living  Communication skills  Supportive family/friends   Treatment Modalities: Medication Management, Group therapy, Case management,  1 to 1 session with clinician, Psychoeducation, Recreational therapy.   Physician Treatment Plan for Primary Diagnosis: MDD (major depressive disorder), recurrent episode, severe (HCC) Long Term Goal(s):     Short Term Goals:    Medication Management: Evaluate patient's response, side effects, and tolerance of medication  regimen.  Therapeutic Interventions: 1 to 1 sessions, Unit Group sessions and Medication administration.  Evaluation of Outcomes: Progressing  Physician Treatment Plan for Secondary Diagnosis: Principal Problem:   MDD (major depressive disorder), recurrent episode, severe (HCC)  Long Term Goal(s):     Short Term Goals:       Medication Management: Evaluate patient's response, side effects, and tolerance of medication regimen.  Therapeutic Interventions: 1 to 1 sessions, Unit Group sessions and Medication administration.  Evaluation of Outcomes: Progressing   RN Treatment Plan for Primary Diagnosis: MDD (major depressive disorder), recurrent episode, severe (HCC) Long Term Goal(s): Knowledge of disease and therapeutic regimen to maintain health will improve  Short Term Goals: Ability to remain free from injury will improve, Ability to verbalize frustration and anger appropriately will improve, Ability to demonstrate self-control, Ability to participate in decision making will improve, Ability to verbalize feelings will improve, Ability to disclose and discuss suicidal ideas, Ability to identify and develop effective coping behaviors will improve, and Compliance with prescribed medications will improve  Medication Management: RN will administer medications as ordered by provider, will assess and evaluate patient's response and provide education to patient for prescribed medication. RN will report any adverse and/or side effects to prescribing provider.  Therapeutic Interventions: 1 on 1 counseling sessions, Psychoeducation, Medication administration, Evaluate responses to treatment, Monitor vital signs and CBGs as ordered, Perform/monitor CIWA, COWS, AIMS and Fall Risk screenings as ordered, Perform wound care treatments as ordered.  Evaluation of Outcomes: Progressing   LCSW Treatment Plan for Primary Diagnosis: MDD (major depressive disorder), recurrent episode, severe (HCC) Long  Term Goal(s): Safe transition to appropriate next level of care at discharge, Engage patient in therapeutic group addressing interpersonal concerns.  Short Term Goals: Engage patient in aftercare planning with referrals and resources, Increase social support, Increase ability to appropriately verbalize feelings, Increase emotional regulation, Facilitate acceptance of mental health diagnosis and concerns, Facilitate patient progression through stages of change regarding substance use diagnoses and concerns, and Identify triggers associated with mental health/substance abuse issues  Therapeutic Interventions: Assess for all discharge needs, 1 to 1 time with Social worker, Explore available resources and support systems, Assess for adequacy in community support network, Educate family and significant other(s) on suicide prevention, Complete Psychosocial Assessment, Interpersonal group therapy.  Evaluation of Outcomes: Progressing   Progress in Treatment: Attending groups: Yes. Participating in groups: Yes. Taking medication as prescribed: Yes. Toleration medication: Yes. Family/Significant other contact made: No, will contact:  Consents pending Patient understands diagnosis: Yes. Discussing patient identified problems/goals with staff: Yes. Medical problems stabilized or resolved: Yes. Denies suicidal/homicidal ideation: Yes. Issues/concerns per patient self-inventory:  Yes. Other: N/A  New problem(s) identified: No, Describe:  None reported  New Short Term/Long Term Goal(s): stabilization, elimination of SI thoughts, development of comprehensive mental wellness plan.   Patient Goals:  Medication Stabilization   Discharge Plan or Barriers: CSW will continue to follow and assess for appropriate referrals and possible discharge planning.   Reason for Continuation of Hospitalization: Anxiety Depression Hallucinations Medication stabilization Suicidal ideation  Estimated Length of Stay:  5-7 Days  Last 3 [place] Suicide Severity Risk Score: Flowsheet Row Admission (Current) [facility] Hosp-Admission (Discharged) from 01/16/2023 in [facility] from [facility]07/20[facility]  C-SSRS RISK CATEGORY No Risk No Risk No Risk       Last PHQ 2/9 Scores:    01/09/2023   10:42 AM 09/26/2022   10:33 AM  Depression screen PHQ 2/9  Decreased Interest 0 0  Down, Depressed, Hopeless 0 0  PHQ - 2 Score 0 0  Altered sleeping  0  Tired, decreased energy  0  Change in appetite  0  Feeling bad or failure about yourself   0  Trouble concentrating  0  Moving slowly or fidgety/restless  0  Suicidal thoughts  0  PHQ-9 Score  0  Difficult doing work/chores  Not difficult at all      medication stabilization, elimination of SI thoughts, development of comprehensive mental wellness plan.   Scribe for Treatment Team: Ane Payment, LCSW 01/21/2023 12:45 PM

## 2023-01-21 NOTE — Plan of Care (Signed)
  Problem: Education: Goal: Knowledge of [place] [facility] Education information/materials will improve Outcome: Progressing   

## 2023-01-21 NOTE — Group Note (Signed)
Recreation Therapy Group Note   Group Topic:Other  Group Date: 01/21/2023 Start Time: 0920 End Time: 1012 Facilitators: Kairi Tufo-McCall, LRT,CTRS Location: [address] Dayroom   Group Topic: Self-Expression  Goal Area(s) Addresses:  Patient will successfully identify positive attributes about themselves.  Patient will identify healthy ways to express feelings. Patient will acknowledge benefit(s) of improved self-expression.   Activity: Quarry manager. Patients were given card stock paper and individual watercolor sets. Patients were to use the supplies given to create pictures express what they are feeling or thinking.  Education: Self-Expression Discharge Planning  Education Outcome: Acknowledges education/In group clarification offered/Needs additional education   Affect/Mood: N/A   Participation Level: Did not attend    Clinical Observations/Individualized Feedback:     Plan: Continue to engage patient in RT group sessions 2-3x/week.   Betzy Barbier-McCall, LRT,CTRS 01/21/2023 1:39 PM

## 2023-01-22 DIAGNOSIS — F1594 Other stimulant use, unspecified with stimulant-induced mood disorder: Secondary | ICD-10-CM | POA: Diagnosis not present

## 2023-01-22 LAB — LIPID PANEL
Cholesterol: 177 mg/dL (ref 0–200)
HDL: 32 mg/dL — ABNORMAL LOW (ref >40)
LDL Cholesterol: 109 mg/dL — ABNORMAL HIGH (ref 0–99)
Total CHOL/HDL Ratio: 5.5 {ratio}
Triglycerides: 181 mg/dL — ABNORMAL HIGH (ref <150)
VLDL: 36 mg/dL (ref 0–40)

## 2023-01-22 LAB — HEMOGLOBIN A1C
Hgb A1c MFr Bld: 5.7 % — ABNORMAL HIGH (ref 4.8–5.6)
Mean Plasma Glucose: 116.89 mg/dL

## 2023-01-22 LAB — VITAMIN D 25 HYDROXY (VIT D DEFICIENCY, FRACTURES): Vit D, 25-Hydroxy: 26.3 ng/mL — ABNORMAL LOW (ref 30–100)

## 2023-01-22 LAB — TSH: TSH: 3.171 u[IU]/mL (ref 0.350–4.500)

## 2023-01-22 MED ORDER — DULOXETINE HCL 60 MG PO CPEP
60.0000 mg | ORAL_CAPSULE | Freq: Every day | ORAL | Status: DC
  Administered 2023-01-23: 60 mg via ORAL
  Filled 2023-01-22 (×3): qty 1

## 2023-01-22 MED ORDER — VITAMIN D (ERGOCALCIFEROL) 1.25 MG (50000 UNIT) PO CAPS
50000.0000 [IU] | ORAL_CAPSULE | ORAL | Status: DC
  Administered 2023-01-23: 50000 [IU] via ORAL
  Filled 2023-01-22: qty 1

## 2023-01-22 MED ORDER — TRAZODONE HCL 50 MG PO TABS: 50.0000 mg | ORAL_TABLET | Freq: Every evening | ORAL | Status: DC | PRN

## 2023-01-22 NOTE — Progress Notes (Signed)
Pt isolating in room and sleeping but arouses easily, Responding appropriately CIWA -0 No signs or symptoms of withdrawal. Denies SI/HI/AVH Going to meals with group No  complaints currently

## 2023-01-22 NOTE — Progress Notes (Signed)
   01/22/23 0300  Psych Admission Type (Psych Patients Only)  Admission Status Voluntary/72 hour document signed  Date 72 hour document signed  01/20/23  Time 72 hour document signed  1715  Psychosocial Assessment  Patient Complaints None  Eye Contact Brief  Facial Expression Flat  Affect Flat  Speech Soft  Interaction Minimal  Motor Activity Slow  Appearance/Hygiene In scrubs  Behavior Characteristics Cooperative;Appropriate to situation  Mood Depressed  Thought Process  Coherency WDL  Content WDL  Delusions None reported or observed  Perception WDL  Hallucination None reported or observed  Judgment WDL  Confusion None  Danger to Self  Current suicidal ideation? Denies  Danger to Others  Danger to Others None reported or observed

## 2023-01-22 NOTE — Plan of Care (Signed)
  Problem: Coping: Goal: Ability to verbalize frustrations and anger appropriately will improve Outcome: Progressing Goal: Ability to demonstrate self-control will improve Outcome: Progressing   

## 2023-01-22 NOTE — Progress Notes (Signed)
[facility] MD Progress Note  01/22/2023 9:43 PM Francisco Bates  MRN:  [id number]  Reason for admission: Francisco Bates is a 57 yo African-American male with a prior mental health history in the distant past of MDD who was transferred to this hospital voluntarily from the [facility] for auditory and visual hallucinations of people x 1 week.  Patient reported in ER that he had been drinking 24 ounce beers x 2 prior to arrival at the hospital, but BAL was <10.  Historically, patient has had a BAL as high as 434 dating back 11 years ago, and through the years he is BAL in chart review has trended higher than usual.   Patient assessment note: On assessment today, the pt reports that their mood is significantly better than at admission. Continues to deny that he is depressed. Reports that anxiety is under control Sleep is still not good, but it is because the bed at this hospital is not comfortable as per patient Appetite is good Concentration is good  Energy level is average Denies suicidal thoughts. Denies suicidal intent and plan.  Denies having any HI.  Denies having psychotic symptoms. Denies AVH, denies paranoia and denies that he has seen the "men in the black hats".  Denies having side effects to current psychiatric medications.  We discussed changes to current medication regimen, including Discontinuing Seroquel, and adding Trazodone 50 mg as needed to medication regimen. We also discussed increasing Cymbalta to 60 mg daily in the morning to help with chronic pain, anxiety and depressive symptoms. We discussed possibly discharging patient tomorrow if he is able to sleep, outpatient appts are in place, and safety planning is completed with his SO.  Discussed the following psychosocial stressors: Financial stressors, relapse on cocaine. Educated on avoiding people,places and things that cause relapse and to use his support system.    Principal Problem: Stimulant-induced mood disorder  (HCC) Diagnosis: Principal Problem:   Stimulant-induced mood disorder (HCC) Active Problems:   Alcohol use disorder   Essential hypertension   Chronic back pain   Cocaine use disorder (HCC)  Total Time spent with patient: 45 minutes  Past Psychiatric History: H & P  Past Medical History:  Past Medical History:  Diagnosis Date   Asthma    as child - no problems since   Patellar tendon rupture    RT knee   Rash    back   Recovering alcoholic (HCC)     Past Surgical History:  Procedure Laterality Date   ACHILLES TENDON SURGERY     KNEE SURGERY     1995   PATELLAR TENDON REPAIR Right 11/15/2012   Procedure: OPEN REPAIR RIGHT PATELLA TENDON ;  Surgeon: [name], MD;  Location: [facility];  Service: Orthopedics;  Laterality: Right;   Family History:  Family History  Problem Relation Age of Onset   Hypertension Other    Cancer Mother    Cancer Father    Family Psychiatric  History: See H & P Social History:  Social History   Substance and Sexual Activity  Alcohol Use No   Comment: quit in 08/1974     Social History   Substance and Sexual Activity  Drug Use Yes   Types: Cocaine    Social History   Socioeconomic History   Marital status: Divorced    Spouse name: Not on file   Number of children: Not on file   Years of education: Not on file   Highest education level:  12th grade  Occupational History   Not on file  Tobacco Use   Smoking status: Never   Smokeless tobacco: Never  Vaping Use   Vaping status: Never Used  Substance and Sexual Activity   Alcohol use: No    Comment: quit in 08/1974   Drug use: Yes    Types: Cocaine   Sexual activity: Not Currently  Other Topics Concern   Not on file  Social History Narrative   Not on file   Social Determinants of Health   Financial Resource Strain: Low Risk  (01/09/2023)   Overall Financial Resource Strain (CARDIA)    Difficulty of Paying Living Expenses: Not hard at all  Food Insecurity: No Food  Insecurity (01/20/2023)   Hunger Vital Sign    Worried About Running Out of Food in the Last Year: Never true    Ran Out of Food in the Last Year: Never true  Transportation Needs: No Transportation Needs (01/20/2023)   PRAPARE - Administrator, Civil Service (Medical): No    Lack of Transportation (Non-Medical): No  Physical Activity: Insufficiently Active (01/09/2023)   Exercise Vital Sign    Days of Exercise per Week: 4 days    Minutes of Exercise per Session: 30 min  Stress: No Stress Concern Present (01/09/2023)   [employer] of Occupational Health - Occupational Stress Questionnaire    Feeling of Stress : Not at all  Social Connections: Unknown (01/09/2023)   Social Connection and Isolation Panel [NHANES]    Frequency of Communication with Friends and Family: Twice a week    Frequency of Social Gatherings with Friends and Family: Patient declined    Attends Religious Services: Patient declined    Database administrator or Organizations: No    Attends Engineer, structural: Not on file    Marital Status: Divorced   Additional Social History:  Sleep: Poor  Appetite:  Fair  Current Medications: Current Facility-Administered Medications  Medication Dose Route Frequency Provider Last Rate Last Admin   acetaminophen (TYLENOL) tablet 650 mg  650 mg Oral Q6H PRN Maryagnes Amos, FNP   650 mg at 01/20/23 2115   albuterol (VENTOLIN HFA) 108 (90 Base) MCG/ACT inhaler 2 puff  2 puff Inhalation Q4H PRN Kyndal Heringer, Tyler Aas, NP       alum & mag hydroxide-simeth (MAALOX/MYLANTA) 200-200-20 MG/5ML suspension 30 mL  30 mL Oral Q4H PRN Starkes-Perry, Juel Burrow, FNP       amLODipine (NORVASC) tablet 10 mg  10 mg Oral Daily Maryagnes Amos, FNP   10 mg at 01/22/23 1022   diphenhydrAMINE (BENADRYL) capsule 50 mg  50 mg Oral TID PRN Maryagnes Amos, FNP       Or   diphenhydrAMINE (BENADRYL) injection 50 mg  50 mg Intramuscular TID PRN Starkes-Perry, Juel Burrow, FNP       [START ON 01/23/2023] DULoxetine (CYMBALTA) DR capsule 60 mg  60 mg Oral Daily Rodneisha Bonnet, NP       folic acid (FOLVITE) tablet 1 mg  1 mg Oral Daily Maryagnes Amos, FNP   1 mg at 01/22/23 1023   haloperidol (HALDOL) tablet 5 mg  5 mg Oral TID PRN Maryagnes Amos, FNP       Or   haloperidol lactate (HALDOL) injection 5 mg  5 mg Intramuscular TID PRN Starkes-Perry, Juel Burrow, FNP       loperamide (IMODIUM) capsule 2-4 mg  2-4 mg Oral PRN Starleen Blue, NP  LORazepam (ATIVAN) tablet 2 mg  2 mg Oral TID PRN Maryagnes Amos, FNP       Or   LORazepam (ATIVAN) injection 2 mg  2 mg Intramuscular TID PRN Rosario Adie, Juel Burrow, FNP       magnesium oxide (MAG-OX) tablet 400 mg  400 mg Oral BID Maryagnes Amos, FNP   400 mg at 01/22/23 1702   multivitamin with minerals tablet 1 tablet  1 tablet Oral Daily Maryagnes Amos, FNP   1 tablet at 01/22/23 1023   ondansetron (ZOFRAN-ODT) disintegrating tablet 4 mg  4 mg Oral Q6H PRN Starleen Blue, NP       pantoprazole (PROTONIX) EC tablet 40 mg  40 mg Oral Daily Maryagnes Amos, FNP   40 mg at 01/22/23 1024   senna-docusate (Senokot-S) tablet 1 tablet  1 tablet Oral QHS PRN Maryagnes Amos, FNP       thiamine (Vitamin B-1) tablet 100 mg  100 mg Oral Daily Maryagnes Amos, FNP   100 mg at 01/22/23 1024   traZODone (DESYREL) tablet 50 mg  50 mg Oral QHS PRN Starleen Blue, NP       [START ON 01/23/2023] Vitamin D (Ergocalciferol) (DRISDOL) 1.25 MG (50000 UNIT) capsule 50,000 Units  50,000 Units Oral Q7 days Starleen Blue, NP        Lab Results:  Results for orders placed or performed during the hospital encounter of 01/20/23 (from the past 48 hour(s))  TSH     Status: None   Collection Time: 01/22/23  6:40 AM  Result Value Ref Range   TSH 3.171 0.350 - 4.500 uIU/mL    Comment: Performed by a 3rd Generation assay with a functional sensitivity of <=0.01 uIU/mL. Performed at [facility], [address] [place], [place] [address]   Hemoglobin A1c     Status: Abnormal   Collection Time: 01/22/23  6:40 AM  Result Value Ref Range   Hgb A1c MFr Bld 5.7 (H) 4.8 - 5.6 %    Comment: (NOTE) Pre diabetes:          5.7%-6.4%  Diabetes:              >6.4%  Glycemic control for   <7.0% adults with diabetes    Mean Plasma Glucose 116.89 mg/dL    Comment: Performed at [facility], [address], [place], [place] [address]  Lipid panel     Status: Abnormal   Collection Time: 01/22/23  6:40 AM  Result Value Ref Range   Cholesterol 177 0 - 200 mg/dL   Triglycerides 952 (H) <150 mg/dL   HDL 32 (L) >84 mg/dL   Total CHOL/HDL Ratio 5.5 RATIO   VLDL 36 0 - 40 mg/dL   LDL Cholesterol 132 (H) 0 - 99 mg/dL    Comment:        Total Cholesterol/HDL:CHD Risk Coronary Heart Disease Risk Table                     Men   Women  1/2 Average Risk   3.4   3.3  Average Risk       5.0   4.4  2 X Average Risk   9.6   7.1  3 X Average Risk  23.4   11.0        Use the calculated Patient Ratio above and the CHD Risk Table to determine the patient's CHD Risk.  ATP III CLASSIFICATION (LDL):  <100     mg/dL   Optimal  161-096  mg/dL   Near or Above                    Optimal  130-159  mg/dL   Borderline  045-409  mg/dL   High  >811     mg/dL   Very High Performed at [facility], [address], [place], [place] [address]   VITAMIN D 25 Hydroxy (Vit-D Deficiency, Fractures)     Status: Abnormal   Collection Time: 01/22/23  6:40 AM  Result Value Ref Range   Vit D, 25-Hydroxy 26.30 (L) 30 - 100 ng/mL    Comment: (NOTE) Vitamin D deficiency has been defined by the Institute of Medicine  and an Endocrine Society practice guideline as a level of serum 25-OH  vitamin D less than 20 ng/mL (1,2). The Endocrine Society went on to  further define vitamin D insufficiency as a level between 21 and 29  ng/mL (2).  1. IOM (Institute of  Medicine). 2010. Dietary reference intakes for  calcium and D. [place]: [employer]. 2. Holick MF, Binkley [place], Bischoff-Ferrari HA, et al. Evaluation,  treatment, and prevention of vitamin D deficiency: an Endocrine  Society clinical practice guideline, JCEM. 2011 Jul; 96(7): 1911-30.  Performed at [facility], [address], [place], [place] [address]     Blood Alcohol level:  Lab Results  Component Value Date   [facility] <10 01/16/2023   ETH <10 10/23/2022    Metabolic Disorder Labs: Lab Results  Component Value Date   HGBA1C 5.7 (H) 01/22/2023   MPG 116.89 01/22/2023   No results found for: "PROLACTIN" Lab Results  Component Value Date   CHOL 177 01/22/2023   TRIG 181 (H) 01/22/2023   HDL 32 (L) 01/22/2023   CHOLHDL 5.5 01/22/2023   VLDL 36 01/22/2023   LDLCALC 109 (H) 01/22/2023    Physical Findings: AIMS:  , ,  ,  ,    CIWA:  CIWA-Ar Total: 0 COWS:     Musculoskeletal: Strength & Muscle Tone: within normal limits Gait & Station: normal Patient leans: N/A  Psychiatric Specialty Exam:  Presentation  General Appearance:  Appropriate for Environment; Casual; Fairly Groomed  Eye Contact: Fair  Speech: Clear and Coherent  Speech Volume: Normal  Handedness: Right   Mood and Affect  Mood: Euthymic  Affect: Congruent   Thought Process  Thought Processes: Coherent  Descriptions of Associations:Intact  Orientation:Full (Time, Place and Person)  Thought Content:Logical  History of Schizophrenia/Schizoaffective disorder:No  Duration of Psychotic Symptoms:Less than six months  Hallucinations:Hallucinations: None  Ideas of Reference:None  Suicidal Thoughts:Suicidal Thoughts: No  Homicidal Thoughts:Homicidal Thoughts: No   Sensorium  Memory: Recent Fair; Immediate Good  Judgment: Good  Insight: Good   Executive Functions  Concentration: Good  Attention Span: Good  Recall: Good  Fund of  Knowledge: Good  Language: Good   Psychomotor Activity  Psychomotor Activity: Psychomotor Activity: Normal   Assets  Assets: Communication Skills; Resilience; Social Support; Housing   Sleep  Sleep: Sleep: Poor    Physical Exam: Physical Exam Constitutional:      Appearance: Normal appearance.  Musculoskeletal:        General: Normal range of motion.  Neurological:     General: No focal deficit present.    Review of Systems  HENT: Negative.    Respiratory: Negative.    Cardiovascular: Negative.  Skin: Negative.   Psychiatric/Behavioral:  Positive for depression and substance abuse. Negative for hallucinations and suicidal ideas.    Blood pressure 117/88, pulse 87, temperature 99.3 F (37.4 C), temperature source Oral, resp. rate 18, height 5\' 11"  (1.803 m), weight 106.9 kg, SpO2 100%. Body mass index is 32.86 kg/m.  Treatment Plan Summary: Treatment Plan Summary: Daily contact with patient to assess and evaluate symptoms and progress in treatment and Medication management   Safety and Monitoring: Voluntary admission to inpatient psychiatric unit for safety, stabilization and treatment Daily contact with patient to assess and evaluate symptoms and progress in treatment Patient's case to be discussed in multi-disciplinary team meeting Observation Level : q15 minute checks Vital signs: q12 hours Precautions: Safety   Long Term Goal(s): Improvement in symptoms so as ready for discharge   Short Term Goals: Ability to identify changes in lifestyle to reduce recurrence of condition will improve, Ability to verbalize feelings will improve, Ability to disclose and discuss suicidal ideas, Ability to demonstrate self-control will improve, Ability to identify and develop effective coping behaviors will improve, Ability to maintain clinical measurements within normal limits will improve, and Compliance with prescribed medications will improve   Diagnoses Principal  Problem:   Stimulant-induced mood disorder (HCC) Active Problems:   Alcohol use disorder   Essential hypertension   Chronic back pain   Cocaine use disorder (HCC)   Medications: -Increase Cymbalta to 60 mg daily for chronic pain/depressive symptoms/GAD -Continue Ativan1 mg as needed for CIWA scores over 10 every 6 hours -Discontinue Seroquel 25 mg nightly for sleep per pt's request -Discontinued Zyprexa 5 mg on 10/30 as patient is complaining of tiredness on this medication   Other PRNS -Continue Tylenol 650 mg every 6 hours PRN for mild pain -Continue Maalox 30 mg every 4 hrs PRN for indigestion -Continue Milk of Magnesia as needed every 6 hrs for constipation   Patient educated on all medications, including side effects, benefits, and rationales, agreeable to medication adjustments as listed above.   Labs reviewed: Ordered TSH, hemoglobin A1c, lipid panel and vitamin D QTc prolonged at 555, repeat EKG ordered   Total Time spent with patient:  I personally spent 45 minutes on the unit in direct patient care. The direct patient care time included face-to-face time with the patient, reviewing the patient's chart, communicating with other professionals, and coordinating care. Greater than 50% of this time was spent in counseling or coordinating care with the patient regarding goals of hospitalization, psycho-education, and discharge planning needs.    Discharge Planning: Social work and case management to assist with discharge planning and identification of hospital follow-up needs prior to discharge Estimated LOS: 5-7 days Discharge Concerns: Need to establish a safety plan; Medication compliance and effectiveness Discharge Goals: Return home with outpatient referrals for mental health follow-up including medication management/psychotherapy   I certify that inpatient services furnished can reasonably be expected to improve the patient's condition.    Starleen Blue, NP 01/22/2023,  9:43 PM

## 2023-01-22 NOTE — [facility] Group Notes (Signed)
[facility] Group Notes:  (Nursing/MHT/Case Management/Adjunct)  Date:  01/22/2023  Time:  9:06 PM  Type of Therapy:   Wrap up group  Participation Level:  Active  Participation Quality:  Appropriate and Sharing  Affect:  Appropriate  Cognitive:  Alert, Appropriate, and Oriented  Insight:  Appropriate and Good  Engagement in Group:  Engaged  Modes of Intervention:  Discussion  Summary of Progress/Problems: Pt. Shared how today went well. His visits from family warmed his heart.  He was disappointed of being fired from his part time due to his absence. Pt is looking forward returning home on tomorrow.  Annell Greening [place] 01/22/2023, 9:06 PM

## 2023-01-22 NOTE — Group Note (Signed)
LCSW Group Therapy Note   Group Date: 01/22/2023 Start Time: 1100 End Time: 1200   Type of Therapy and Topic:  Group Therapy: Boundaries  Participation Level:  Minimal  Description of Group: This group will address the use of boundaries in their personal lives. Patients will explore why boundaries are important, the difference between healthy and unhealthy boundaries, and negative and postive outcomes of different boundaries and will look at how boundaries can be crossed.  Patients will be encouraged to identify current boundaries in their own lives and identify what kind of boundary is being set. Facilitators will guide patients in utilizing problem-solving interventions to address and correct types boundaries being used and to address when no boundary is being used. Understanding and applying boundaries will be explored and addressed for obtaining and maintaining a balanced life. Patients will be encouraged to explore ways to assertively make their boundaries and needs known to significant others in their lives, using other group members and facilitator for role play, support, and feedback.  Therapeutic Goals:  1.  Patient will identify areas in their life where setting clear boundaries could be  used to improve their life.  2.  Patient will identify signs/triggers that a boundary is not being respected. 3.  Patient will identify two ways to set boundaries in order to achieve balance in  their lives: 4.  Patient will demonstrate ability to communicate their needs and set boundaries  through discussion and/or role plays  Summary of Patient Progress:  Pt attended group and provided minimal feedback and responses. Pt was respectful of peers and stayed entire duration of group.  Therapeutic Modalities:   Cognitive Behavioral Therapy Solution-Focused Therapy  Kathi Der, LCSWA 01/22/2023  12:27 PM

## 2023-01-23 DIAGNOSIS — F1594 Other stimulant use, unspecified with stimulant-induced mood disorder: Secondary | ICD-10-CM | POA: Diagnosis not present

## 2023-01-23 MED ORDER — DULOXETINE HCL 60 MG PO CPEP: 60.0000 mg | ORAL_CAPSULE | Freq: Every day | ORAL | 0 refills | Status: AC

## 2023-01-23 MED ORDER — AMLODIPINE BESYLATE 10 MG PO TABS: 10.0000 mg | ORAL_TABLET | Freq: Every day | ORAL | 0 refills | Status: AC

## 2023-01-23 MED ORDER — VITAMIN D (ERGOCALCIFEROL) 1.25 MG (50000 UNIT) PO CAPS: 50000.0000 [IU] | ORAL_CAPSULE | ORAL | 0 refills | Status: AC

## 2023-01-23 MED ORDER — PANTOPRAZOLE SODIUM 40 MG PO TBEC: 40.0000 mg | DELAYED_RELEASE_TABLET | Freq: Every day | ORAL | 0 refills | Status: AC

## 2023-01-23 MED ORDER — TRAZODONE HCL 50 MG PO TABS: 50.0000 mg | ORAL_TABLET | Freq: Every evening | ORAL | 0 refills | Status: AC | PRN

## 2023-01-23 MED ORDER — ALBUTEROL SULFATE HFA 108 (90 BASE) MCG/ACT IN AERS: 2.0000 | INHALATION_SPRAY | RESPIRATORY_TRACT | 0 refills | Status: AC | PRN

## 2023-01-23 MED ORDER — FOLIC ACID 1 MG PO TABS: 1.0000 mg | ORAL_TABLET | Freq: Every day | ORAL | Status: AC

## 2023-01-23 MED ORDER — ADULT MULTIVITAMIN W/MINERALS CH: 1.0000 | ORAL_TABLET | Freq: Every day | ORAL | Status: AC

## 2023-01-23 MED ORDER — SENNOSIDES-DOCUSATE SODIUM 8.6-50 MG PO TABS: 1.0000 | ORAL_TABLET | Freq: Every evening | ORAL | 0 refills | Status: AC | PRN

## 2023-01-23 NOTE — Progress Notes (Signed)
Pt discharged to the lobby where his wife received him. Pt provided his suicide risk assessment, transition record, AVS discharge packet, and his suicide safety plan. Discharge instructions along with medications to continue taking upon discharge and follow-up appointments were discussed by Geanie Cooley., dayshift RN. This information was reinforced by this Clinical research associate. Pt denies SI/HI and AVH. Suicide prevention information provided and discussed with pt. Pt verbally demonstrated understanding and verbalized no questions. Pt discharged with his belongings from his room and items that were secured in his assigned locker.

## 2023-01-23 NOTE — [facility] Group Notes (Signed)
[facility] Group Notes:  (Nursing/MHT/Case Management/Adjunct)  Date:  01/23/2023  Time:  9:06 PM  Type of Therapy:   Wrap-up group  Participation Level:  Minimal  Participation Quality:  Appropriate  Affect:  Appropriate  Cognitive:  Appropriate  Insight:  Appropriate  Engagement in Group:  Engaged  Modes of Intervention:  Education  Summary of Progress/Problems: Pt reports he was being D/C. Pt waiting on his ride. Rated his day 8/10.  Francisco Bates 01/23/2023, 9:06 PM

## 2023-01-23 NOTE — Group Note (Signed)
Recreation Therapy Group Note   Group Topic:Stress Management  Group Date: 01/23/2023 Start Time: 0930 End Time: 0955 Facilitators: Danilynn Jemison-McCall, LRT,CTRS Location: [address] Dayroom   Group Topic: Stress Management  Goal Area(s) Addresses:  Patient will identify positive stress management techniques. Patient will identify benefits of using stress management post d/c.  Activity: Meditation. LRT explained to patients the essence of meditation and to get as relaxed as possible. LRT played a meditation that guided patients to identify things that are holding them back from reaching the goals they see for themselves.   Education:  Stress Management, Discharge Planning.   Education Outcome: Acknowledges Education   Affect/Mood: Appropriate   Participation Level: Engaged   Participation Quality: Independent   Behavior: Appropriate   Speech/Thought Process: Focused   Insight: Good   Judgement: Good   Modes of Intervention: Meditation   Patient Response to Interventions:  Engaged   Education Outcome:  Acknowledges education   Clinical Observations/Individualized Feedback: Pt attended and participated in group session.      Plan: Continue to engage patient in RT group sessions 2-3x/week.   Morna Flud-McCall, LRT,CTRS 01/23/2023 12:59 PM

## 2023-01-23 NOTE — Discharge Summary (Signed)
Physician Discharge Summary Note  Patient:  Francisco Bates is an 57 y.o., male MRN:  [id number] DOB:  [birth year] Patient phone:  [phone] (home)  Patient address:   [address] [place] [address],  Total Time spent with patient: 45 minutes  Date of Admission:  01/20/2023 Date of Discharge: 01/23/2023  Reason for Admission: Francisco Bates is a 62 yo African-American male with a prior mental health history in the distant past of MDD who was transferred to this hospital voluntarily from the [facility] for auditory and visual hallucinations of people x 1 week. Patient reported in ER that he had been drinking 24 ounce beers x 2 prior to arrival at the hospital, but BAL was <10. Historically, patient has had a BAL as high as 434 dating back 11 years ago, and through the years he is BAL in chart review has trended higher than usual.   Principal Problem: Stimulant-induced mood disorder (HCC) Discharge Diagnoses: Principal Problem:   Stimulant-induced mood disorder (HCC) Active Problems:   Alcohol use disorder   Essential hypertension   Chronic back pain   Cocaine use disorder (HCC)  Past Psychiatric History: See H & P  Past Medical History:  Past Medical History:  Diagnosis Date   Asthma    as child - no problems since   Patellar tendon rupture    RT knee   Rash    back   Recovering alcoholic (HCC)     Past Surgical History:  Procedure Laterality Date   ACHILLES TENDON SURGERY     KNEE SURGERY     1995   PATELLAR TENDON REPAIR Right 11/15/2012   Procedure: OPEN REPAIR RIGHT PATELLA TENDON ;  Surgeon: [name], MD;  Location: [facility];  Service: Orthopedics;  Laterality: Right;   Family History:  Family History  Problem Relation Age of Onset   Hypertension Other    Cancer Mother    Cancer Father    Family Psychiatric  History: See H & P Social History:  Social History   Substance and Sexual Activity  Alcohol Use No   Comment: quit  in 08/1974     Social History   Substance and Sexual Activity  Drug Use Yes   Types: Cocaine    Social History   Socioeconomic History   Marital status: Divorced    Spouse name: Not on file   Number of children: Not on file   Years of education: Not on file   Highest education level: 12th grade  Occupational History   Not on file  Tobacco Use   Smoking status: Never   Smokeless tobacco: Never  Vaping Use   Vaping status: Never Used  Substance and Sexual Activity   Alcohol use: No    Comment: quit in 08/1974   Drug use: Yes    Types: Cocaine   Sexual activity: Not Currently  Other Topics Concern   Not on file  Social History Narrative   Not on file   Social Determinants of Health   Financial Resource Strain: Low Risk  (01/09/2023)   Overall Financial Resource Strain (CARDIA)    Difficulty of Paying Living Expenses: Not hard at all  Food Insecurity: No Food Insecurity (01/20/2023)   Hunger Vital Sign    Worried About Running Out of Food in the Last Year: Never true    Ran Out of Food in the Last Year: Never true  Transportation Needs: No Transportation Needs (01/20/2023)   PRAPARE -  Administrator, Civil Service (Medical): No    Lack of Transportation (Non-Medical): No  Physical Activity: Insufficiently Active (01/09/2023)   Exercise Vital Sign    Days of Exercise per Week: 4 days    Minutes of Exercise per Session: 30 min  Stress: No Stress Concern Present (01/09/2023)   [employer] of Occupational Health - Occupational Stress Questionnaire    Feeling of Stress : Not at all  Social Connections: Unknown (01/09/2023)   Social Connection and Isolation Panel [NHANES]    Frequency of Communication with Friends and Family: Twice a week    Frequency of Social Gatherings with Friends and Family: Patient declined    Attends Religious Services: Patient declined    Database administrator or Organizations: No    Attends Engineer, structural: Not  on file    Marital Status: Divorced   Hospital Course:   During the patient's hospitalization, patient had extensive initial psychiatric evaluation, and follow-up psychiatric evaluations every day. Psychiatric diagnoses provided upon initial assessment: As listed above. Patient's psychiatric medications were adjusted on admission:  -Start Cymbalta 30 mg daily for chronic pain/depressive symptoms/GAD -Start Ativan 1 mg as needed for CIWA scores over 10 every 6 hours -Start Seroquel 25 mg nightly for sleep -Discontinue Zyprexa 5 mg as patient is complaining of tiredness on this medication   During the hospitalization, other adjustments were made to the patient's psychiatric medication regimen: Medications at discharge are as listed below: -Continue Cymbalta to 60 mg daily for chronic pain/depressive symptoms/GAD -Continue Trazodone 50 mg nightly as needed for sleep -Continue Melatonin 5 mg nightly for sleep -Continue Norvasc 10 mg daily for htn -Continue Protonix 40mg  daily for GERD -Continue Vitamin D 50.000 units weekly for Vitamin D deficiency   Patient's care was discussed during the interdisciplinary team meeting every day during the hospitalization. The patient denies having side effects to prescribed psychiatric medication. Gradually, patient started adjusting to milieu. The patient was evaluated each day by a clinical provider to ascertain response to treatment. Improvement was noted by the patient's report of decreasing symptoms, improved sleep and appetite, affect, medication tolerance, behavior, and participation in unit programming.  Patient was asked each day to complete a self inventory noting mood, mental status, pain, new symptoms, anxiety and concerns.     Symptoms were reported as significantly decreased or resolved completely by discharge. On day of discharge, the patient reports that their mood is stable. The patient denied having suicidal thoughts for more than 48 hours prior  to discharge.  Patient denies having homicidal thoughts.  Patient denies having auditory hallucinations.  Patient denies any visual hallucinations or other symptoms of psychosis. The patient was motivated to continue taking medication with a goal of continued improvement in mental health.    The patient reports their target psychiatric symptoms of insomnia, psychosis, anxiety responded well to the psychiatric medications, and the patient reports overall benefit from this psychiatric hospitalization. Supportive psychotherapy was provided to the patient. The patient also participated in regular group therapy while hospitalized. Coping skills, problem solving as well as relaxation therapies were also part of the unit programming.   Labs were reviewed with the patient, and abnormal results were discussed with the patient.   The patient is able to verbalize their individual safety plan to this provider.   # It is recommended to the patient to continue psychiatric medications as prescribed, after discharge from the hospital.     # It is recommended to  the patient to follow up with your outpatient psychiatric provider and PCP.   # It was discussed with the patient, the impact of alcohol, drugs, tobacco have been there overall psychiatric and medical wellbeing, and total abstinence from substance use was recommended the patient.ed.   # Prescriptions provided or sent directly to preferred pharmacy at discharge. Patient agreeable to plan. Given opportunity to ask questions. Appears to feel comfortable with discharge.    # In the event of worsening symptoms, the patient is instructed to call the crisis hotline, 911 and or go to the nearest ED for appropriate evaluation and treatment of symptoms. To follow-up with primary care provider for other medical issues, concerns and or health care needs   # Patient was discharged home with a plan to follow up as noted below.    Total Time spent with patient: 45  minutes    Physical Findings: AIMS:0 CIWA:  CIWA-Ar Total: 0 COWS:  n/a  Musculoskeletal: Strength & Muscle Tone: within normal limits Gait & Station: normal Patient leans: N/A  Psychiatric Specialty Exam:  Presentation  General Appearance:  Appropriate for Environment; Fairly Groomed  Eye Contact: Fair  Speech: Clear and Coherent  Speech Volume: Normal  Handedness: Right   Mood and Affect  Mood: Euthymic  Affect: Appropriate; Congruent   Thought Process  Thought Processes: Coherent  Descriptions of Associations:Intact  Orientation:Full (Time, Place and Person)  Thought Content:Logical  History of Schizophrenia/Schizoaffective disorder:No  Duration of Psychotic Symptoms:N/A  Hallucinations:Hallucinations: None  Ideas of Reference:None  Suicidal Thoughts:Suicidal Thoughts: No  Homicidal Thoughts:Homicidal Thoughts: No   Sensorium  Memory: Immediate Good  Judgment: Fair  Insight: Good   Executive Functions  Concentration: Good  Attention Span: Good  Recall: Good  Fund of Knowledge: Good  Language: Good   Psychomotor Activity  Psychomotor Activity: Psychomotor Activity: Normal  Assets  Assets: Communication Skills; Resilience; Social Support  Sleep  Sleep: Sleep: Good  Physical Exam: Physical Exam Review of Systems  Psychiatric/Behavioral:  Positive for depression and substance abuse. Negative for hallucinations, memory loss and suicidal ideas. The patient is nervous/anxious and has insomnia.   All other systems reviewed and are negative.  Blood pressure 111/79, pulse (!) 104, temperature 98.3 F (36.8 C), temperature source Oral, resp. rate 18, height 5\' 11"  (1.803 m), weight 106.9 kg, SpO2 99%. Body mass index is 32.86 kg/m.  Social History   Tobacco Use  Smoking Status Never  Smokeless Tobacco Never   Tobacco Cessation:  N/A, patient does not currently use tobacco products  Blood Alcohol level:   Lab Results  Component Value Date   ETH <10 01/16/2023   ETH <10 10/23/2022   Metabolic Disorder Labs:  Lab Results  Component Value Date   HGBA1C 5.7 (H) 01/22/2023   MPG 116.89 01/22/2023   No results found for: "PROLACTIN" Lab Results  Component Value Date   CHOL 177 01/22/2023   TRIG 181 (H) 01/22/2023   HDL 32 (L) 01/22/2023   CHOLHDL 5.5 01/22/2023   VLDL 36 01/22/2023   LDLCALC 109 (H) 01/22/2023    See Psychiatric Specialty Exam and Suicide Risk Assessment completed by Attending Physician prior to discharge.  Discharge destination:  Home  Is patient on multiple antipsychotic therapies at discharge:  No   Has Patient had three or more failed trials of antipsychotic monotherapy by history:  No  Recommended Plan for Multiple Antipsychotic Therapies: NA   Allergies as of 01/23/2023   No Known Allergies  Medication List     STOP taking these medications    benzonatate 100 MG capsule Commonly known as: TESSALON   magnesium oxide 400 (240 Mg) MG tablet Commonly known as: MAG-OX   OLANZapine zydis 5 MG disintegrating tablet Commonly known as: ZYPREXA   thiamine 100 MG tablet Commonly known as: Vitamin B-1       TAKE these medications      Indication  albuterol 108 (90 Base) MCG/ACT inhaler Commonly known as: VENTOLIN HFA Inhale 2 puffs into the lungs every 4 (four) hours as needed for shortness of breath.  Indication: Chronic Obstructive Lung Disease   amLODipine 10 MG tablet Commonly known as: NORVASC Take 1 tablet (10 mg total) by mouth daily.  Indication: High Blood Pressure   DULoxetine 60 MG capsule Commonly known as: CYMBALTA Take 1 capsule (60 mg total) by mouth daily.  Indication: Major Depressive Disorder, Musculoskeletal Pain, Peripheral Nerve Disease   folic acid 1 MG tablet Commonly known as: FOLVITE Take 1 tablet (1 mg total) by mouth daily.  Indication: Anemia From Inadequate Folic Acid   multivitamin with minerals  Tabs tablet Take 1 tablet by mouth daily.  Indication: Nutritional Support   pantoprazole 40 MG tablet Commonly known as: PROTONIX Take 1 tablet (40 mg total) by mouth daily. What changed: additional instructions  Indication: Gastroesophageal Reflux Disease   senna-docusate 8.6-50 MG tablet Commonly known as: Senokot-S Take 1 tablet by mouth at bedtime as needed for mild constipation.  Indication: Constipation   traZODone 50 MG tablet Commonly known as: DESYREL Take 1 tablet (50 mg total) by mouth at bedtime as needed for sleep.  Indication: Trouble Sleeping   Vitamin D (Ergocalciferol) 1.25 MG (50000 UNIT) Caps capsule Commonly known as: DRISDOL Take 1 capsule (50,000 Units total) by mouth every 7 (seven) days.  Indication: Vitamin D Deficiency        Follow-up Information     [facility]. Go on 02/16/2023.   Specialty: Behavioral Health Why: You have an appointment for medication management services on 02/16/23 at 9:00 am. * Please fill out and bring with you the paperwork that was sent to you.  (If not, please arrive 15 minutes early)   You also have an appointment for 02/26/23 at 10:00 am, in person. Contact information: [address] [place] [address] [phone]               Signed: Starleen Blue, NP 01/23/2023, 5:36 PM

## 2023-01-23 NOTE — Plan of Care (Signed)

## 2023-01-23 NOTE — Progress Notes (Signed)
  [facility] Adult Case Management Discharge Plan :  Will you be returning to the same living situation after discharge:  Yes,  pt will be returning back to his house with significant other  At discharge, do you have transportation home?: Yes,  Pt shared that his significant other  will pick him up between 4pm/5pm Do you have the ability to pay for your medications: Yes,  [employer] PPO  Release of information consent forms completed and in the chart;  Patient's signature needed at discharge.  Patient to Follow up at:  Follow-up Information     [facility]. Go on 02/16/2023.   Specialty: Behavioral Health Why: You have an appointment for medication management services on 02/16/23 at 9:00 am. * Please fill out and bring with you the paperwork that was sent to you.  (If not, please arrive 15 minutes early)   You also have an appointment for 02/26/23 at 10:00 am, in person. Contact information: [address] [place] [address] [phone]                Next level of care provider has access to [facility] Link:yes  Safety Planning and Suicide Prevention discussed: No. CSW tried to call significant other throughout the day but no answer from Lowella Bandy ( spouse ) [phone]      Has patient been referred to the Quitline?: Patient does not use tobacco/nicotine products  Patient has been referred for addiction treatment: Patient refused referral for treatment.Pt does drink beer and use cocaine   Isabella Bowens, LCSWA 01/23/2023, 3:53 PM
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
## 2023-01-23 NOTE — [facility] Suicide Risk Assessment (Signed)
[facility] INPATIENT:  Family/Significant Other Suicide Prevention Education  Suicide Prevention Education:  Contact Attempts: Francisco Bates ( spouse ) [phone], (name of family member/significant other) has been identified by the patient as the family member/significant other with whom the patient will be residing, and identified as the person(s) who will aid the patient in the event of a mental health crisis.  With written consent from the patient, two attempts were made to provide suicide prevention education, prior to and/or following the patient's discharge.  We were unsuccessful in providing suicide prevention education.  A suicide education pamphlet was given to the patient to share with family/significant other.  Date and time of first attempt:01/23/2023/@ 9:45 AM  Date and time of second attempt:01/23/2023/ @12 :45 PM and 3:52 PM     Francisco Bates 01/23/2023, 3:52 PM

## 2023-02-10 ENCOUNTER — Ambulatory Visit
Admission: EM | Admit: 2023-02-10 | Discharge: 2023-02-10 | Disposition: A | Discharge status: Home / Self Care | Payer: BC Managed Care – PPO | Attending: Physician Assistant | Admitting: Physician Assistant

## 2023-02-10 DIAGNOSIS — M545 Low back pain, unspecified: Secondary | ICD-10-CM | POA: Diagnosis not present

## 2023-02-10 MED ORDER — CYCLOBENZAPRINE HCL 10 MG PO TABS
10.0000 mg | ORAL_TABLET | Freq: Two times a day (BID) | ORAL | 0 refills | Status: AC | PRN
Start: 2023-02-10 — End: ?

## 2023-02-10 MED ORDER — PREDNISONE 20 MG PO TABS: 40.0000 mg | ORAL_TABLET | Freq: Every day | ORAL | 0 refills | Status: AC

## 2023-02-10 NOTE — ED Triage Notes (Signed)
Presents for low back pain. States has hx of back problems. No surgeries. Reports difficulty standing up straight. Has missed 2 days of work. No recent injuries.Has had no OTC meds today. No loss of urine or bowel control.

## 2023-02-16 ENCOUNTER — Telehealth (HOSPITAL_COMMUNITY): Payer: BC Managed Care – PPO | Admitting: Psychiatry

## 2023-02-16 ENCOUNTER — Encounter (HOSPITAL_COMMUNITY): Payer: Self-pay

## 2023-02-21 NOTE — ED Provider Notes (Signed)
[name]    CSN: [id number] Arrival date & time: 02/10/23  0814      History   Chief Complaint Chief Complaint  Patient presents with   Back Pain    HPI Francisco Bates is a 57 y.o. male.   Patient here today for evaluation of low back pain.  He does have history of back problems.  He has not had any surgeries.  He reports that he has difficulty standing up straight due to pain.  He has missed 2 days of work and needs a work note for same.  He has not taken any over-the-counter medication today.  He denies  loss of bowel or bladder control.  The history is provided by the patient.  Back Pain Associated symptoms: no fever and no numbness     Past Medical History:  Diagnosis Date   Alcohol use disorder 03/25/2011   Has been in recovery without a sponsor or working the steps much and could not do it alone.     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   Hypomagnesemia 01/20/2023   Hypophosphatemia 01/18/2023   Hypokalemia 01/17/2023   Psychosis (HCC) 01/17/2023   Essential hypertension 01/17/2023   Chronic alcohol use 01/17/2023   Chronic alcohol abuse    Obesity (BMI 30-39.9) 11/16/2012    Past Surgical History:  Procedure Laterality Date   ACHILLES TENDON SURGERY     KNEE SURGERY     1995   PATELLAR TENDON REPAIR Right 11/15/2012   Procedure: OPEN REPAIR RIGHT PATELLA TENDON ;  Surgeon: [name], MD;  Location: [facility];  Service: Orthopedics;  Laterality: Right;       Home Medications    Prior to Admission medications   Medication Sig Start Date End Date Taking? Authorizing Provider  cyclobenzaprine (FLEXERIL) 10 MG tablet Take 1 tablet (10 mg total) by mouth 2 (two) times daily  as needed for muscle spasms. 02/10/23  Yes Francisco Bamberger, PA-C  albuterol (VENTOLIN HFA) 108 (90 Base) MCG/ACT inhaler Inhale 2 puffs into the lungs every 4 (four) hours as needed for shortness of breath. 01/23/23   Starleen Blue, NP  amLODipine (NORVASC) 10 MG tablet Take 1 tablet (10 mg total) by mouth daily. 01/23/23   Starleen Blue, NP  DULoxetine (CYMBALTA) 60 MG capsule Take 1 capsule (60 mg total) by mouth daily. 01/23/23   Starleen Blue, NP  folic acid (FOLVITE) 1 MG tablet Take 1 tablet (1 mg total) by mouth daily. 01/23/23   Starleen Blue, NP  Multiple Vitamin (MULTIVITAMIN WITH MINERALS) TABS tablet Take 1 tablet by mouth daily. 01/23/23   Starleen Blue, NP  pantoprazole (PROTONIX) 40 MG tablet Take 1 tablet (40 mg total) by mouth daily. 01/23/23   Starleen Blue, NP  senna-docusate (SENOKOT-S) 8.6-50 MG tablet Take 1 tablet by mouth at bedtime as needed for mild constipation. 01/23/23   Starleen Blue, NP  traZODone (DESYREL) 50 MG tablet Take 1 tablet (50 mg total) by mouth at bedtime as needed for sleep. 01/23/23   Starleen Blue, NP  Vitamin D, Ergocalciferol, (DRISDOL) 1.25 MG (50000 UNIT) CAPS capsule Take 1 capsule (50,000 Units  total) by mouth every 7 (seven) days. 01/23/23   Starleen Blue, NP    Family History Family History  Problem Relation Age of Onset   Hypertension Other    Cancer Mother    Cancer Father     Social History Social History   Tobacco Use   Smoking status: Never   Smokeless tobacco: Never  Vaping Use   Vaping status: Never Used  Substance Use Topics   Alcohol use: No    Comment: quit in 08/1974   Drug use: Yes    Types: Cocaine     Allergies   Patient has no known allergies.   Review of Systems Review of Systems  Constitutional:  Negative for chills and fever.  Eyes:  Negative for discharge and redness.  Musculoskeletal:  Positive for back pain and myalgias.  Neurological:  Negative for numbness.     Physical Exam Triage Vital  Signs ED Triage Vitals  Encounter Vitals Group     BP 02/10/23 0908 (!) 150/88     Systolic BP Percentile --      Diastolic BP Percentile --      Pulse Rate 02/10/23 0908 (!) 103     Resp 02/10/23 0908 18     Temp 02/10/23 0908 98 F (36.7 C)     Temp Source 02/10/23 0908 Oral     SpO2 02/10/23 0908 95 %     Weight --      Height --      Head Circumference --      Peak Flow --      Pain Score 02/10/23 0909 8     Pain Loc --      Pain Education --      Exclude from Growth Chart --    No data found.  Updated Vital Signs BP (!) 150/88 (BP Location: Left Arm)   Pulse (!) 103   Temp 98 F (36.7 C) (Oral)   Resp 18   SpO2 95%   Visual Acuity Right Eye Distance:   Left Eye Distance:   Bilateral Distance:    Right Eye Near:   Left Eye Near:    Bilateral Near:     Physical Exam Vitals and nursing note reviewed.  Constitutional:      General: He is not in acute distress.    Appearance: Normal appearance. He is not ill-appearing.  HENT:     Head: Normocephalic and atraumatic.  Eyes:     Conjunctiva/sclera: Conjunctivae normal.  Cardiovascular:     Rate and Rhythm: Normal rate.  Pulmonary:     Effort: Pulmonary effort is normal. No respiratory distress.  Musculoskeletal:     Comments: No tenderness to palpation of midline thoracic or lumbar spine.  Mild tenderness palpation across low back  Neurological:     Mental Status: He is alert.  Psychiatric:        Mood and Affect: Mood normal.        Behavior: Behavior normal.        Thought Content: Thought content normal.      UC Treatments / Results  Labs (all labs ordered are listed, but only abnormal results are displayed) Labs Reviewed - No data to display  EKG   Radiology No results found.  Procedures Procedures (including critical care time)  Medications Ordered in UC Medications - No data to display  Initial Impression / Assessment and Plan / UC Course  I have reviewed the triage vital signs and  the nursing  notes.  Pertinent labs & imaging results that were available during my care of the patient were reviewed by me and considered in my medical decision making (see chart for details).    Will treat with steroid burst and muscle relaxer.  Discussed the muscle relaxer may cause drowsiness.  Encouraged massage, heat application, slow controlled stretches.  Advised follow-up if no gradual improvement with any further concerns.  Patient expressed understanding.  Final Clinical Impressions(s) / UC Diagnoses   Final diagnoses:  Acute bilateral low back pain without sciatica   Discharge Instructions   None    ED Prescriptions     Medication Sig Dispense Auth. Provider   predniSONE (DELTASONE) 20 MG tablet Take 2 tablets (40 mg total) by mouth daily with breakfast for 5 days. 10 tablet Erma Pinto F, PA-C   cyclobenzaprine (FLEXERIL) 10 MG tablet Take 1 tablet (10 mg total) by mouth 2 (two) times daily as needed for muscle spasms. 20 tablet Francisco Bamberger, PA-C      PDMP not reviewed this encounter.   Francisco Bamberger, PA-C 02/21/23 1400

## 2023-02-26 ENCOUNTER — Ambulatory Visit (HOSPITAL_COMMUNITY): Payer: BC Managed Care – PPO | Admitting: Licensed Clinical Social Worker

## 2023-03-03 ENCOUNTER — Ambulatory Visit (HOSPITAL_COMMUNITY): Payer: BC Managed Care – PPO | Admitting: Licensed Clinical Social Worker

## 2023-04-02 ENCOUNTER — Ambulatory Visit (INDEPENDENT_AMBULATORY_CARE_PROVIDER_SITE_OTHER): Payer: MEDICAID | Admitting: Licensed Clinical Social Worker

## 2023-04-02 DIAGNOSIS — F4323 Adjustment disorder with mixed anxiety and depressed mood: Secondary | ICD-10-CM | POA: Diagnosis not present

## 2023-04-02 DIAGNOSIS — F1594 Other stimulant use, unspecified with stimulant-induced mood disorder: Secondary | ICD-10-CM
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
## 2023-04-30 ENCOUNTER — Ambulatory Visit (INDEPENDENT_AMBULATORY_CARE_PROVIDER_SITE_OTHER): Payer: Self-pay | Admitting: Licensed Clinical Social Worker

## 2023-04-30 DIAGNOSIS — Z91199 Patient's noncompliance with other medical treatment and regimen due to unspecified reason: Secondary | ICD-10-CM

## 2023-04-30 NOTE — Progress Notes (Signed)
 THERAPIST PROGRESS NOTE   Session Date: 04/30/2023  Session Time: 1300  Patient no-showed today's appointment; appointment was for follow up therapy appt. Next scheduled appt is on 05/21/23.   Patsi Boots, MSW, LCSW 04/30/2023,  1:56 PM

## 2023-05-21 ENCOUNTER — Ambulatory Visit (HOSPITAL_COMMUNITY): Payer: BC Managed Care – PPO | Admitting: Licensed Clinical Social Worker

## 2023-05-27 ENCOUNTER — Ambulatory Visit (HOSPITAL_COMMUNITY): Payer: BC Managed Care – PPO | Admitting: Licensed Clinical Social Worker

## 2023-06-11 ENCOUNTER — Ambulatory Visit (HOSPITAL_COMMUNITY): Payer: BC Managed Care – PPO | Admitting: Licensed Clinical Social Worker

## 2023-07-02 ENCOUNTER — Ambulatory Visit (HOSPITAL_COMMUNITY): Payer: BC Managed Care – PPO | Admitting: Licensed Clinical Social Worker

## 2023-07-23 ENCOUNTER — Ambulatory Visit (HOSPITAL_COMMUNITY): Payer: BC Managed Care – PPO | Admitting: Licensed Clinical Social Worker
